# Patient Record
Sex: Male | Born: 1955 | Race: Black or African American | Hispanic: No | Marital: Single | Smoking: Never smoker
Health system: Southern US, Community
[De-identification: ages and names within clinical notes are randomized; demographics above are authoritative.]

## PROBLEM LIST (undated history)

## (undated) DIAGNOSIS — I10 Essential (primary) hypertension: Secondary | ICD-10-CM

## (undated) DIAGNOSIS — T7840XA Allergy, unspecified, initial encounter: Secondary | ICD-10-CM

## (undated) DIAGNOSIS — E119 Type 2 diabetes mellitus without complications: Secondary | ICD-10-CM

## (undated) DIAGNOSIS — D509 Iron deficiency anemia, unspecified: Secondary | ICD-10-CM

## (undated) DIAGNOSIS — D72819 Decreased white blood cell count, unspecified: Secondary | ICD-10-CM

## (undated) DIAGNOSIS — R030 Elevated blood-pressure reading, without diagnosis of hypertension: Secondary | ICD-10-CM

## (undated) DIAGNOSIS — K219 Gastro-esophageal reflux disease without esophagitis: Secondary | ICD-10-CM

## (undated) DIAGNOSIS — N400 Enlarged prostate without lower urinary tract symptoms: Secondary | ICD-10-CM

## (undated) HISTORY — DX: Essential (primary) hypertension: I10

## (undated) HISTORY — DX: Allergy, unspecified, initial encounter: T78.40XA

## (undated) HISTORY — DX: Elevated blood-pressure reading, without diagnosis of hypertension: R03.0

## (undated) HISTORY — DX: Type 2 diabetes mellitus without complications: E11.9

## (undated) HISTORY — DX: Iron deficiency anemia, unspecified: D50.9

## (undated) HISTORY — DX: Benign prostatic hyperplasia without lower urinary tract symptoms: N40.0

## (undated) HISTORY — DX: Gastro-esophageal reflux disease without esophagitis: K21.9

## (undated) HISTORY — DX: Decreased white blood cell count, unspecified: D72.819

---

## 1997-08-21 ENCOUNTER — Encounter: Admission: RE | Admit: 1997-08-21 | Discharge: 1997-08-21 | Payer: Self-pay | Admitting: Family Medicine

## 1997-09-07 ENCOUNTER — Encounter: Admission: RE | Admit: 1997-09-07 | Discharge: 1997-12-06 | Payer: Self-pay | Admitting: *Deleted

## 2000-05-29 ENCOUNTER — Encounter: Admission: RE | Admit: 2000-05-29 | Discharge: 2000-07-02 | Payer: Self-pay | Admitting: Family Medicine

## 2003-06-19 ENCOUNTER — Encounter: Payer: Self-pay | Admitting: Family Medicine

## 2005-01-10 ENCOUNTER — Ambulatory Visit: Payer: Self-pay | Admitting: Family Medicine

## 2005-01-24 ENCOUNTER — Ambulatory Visit: Payer: Self-pay

## 2006-04-15 ENCOUNTER — Ambulatory Visit: Payer: Self-pay | Admitting: Family Medicine

## 2006-04-15 LAB — CONVERTED CEMR LAB
ALT: 20 units/L (ref 0–40)
AST: 20 units/L (ref 0–37)
Albumin: 3.1 g/dL — ABNORMAL LOW (ref 3.5–5.2)
Alkaline Phosphatase: 67 units/L (ref 39–117)
BUN: 11 mg/dL (ref 6–23)
Basophils Absolute: 0 10*3/uL (ref 0.0–0.1)
Basophils Relative: 1.4 % — ABNORMAL HIGH (ref 0.0–1.0)
Bilirubin, Direct: 0.1 mg/dL (ref 0.0–0.3)
CO2: 29 meq/L (ref 19–32)
Calcium: 8.4 mg/dL (ref 8.4–10.5)
Chloride: 109 meq/L (ref 96–112)
Cholesterol: 158 mg/dL (ref 0–200)
Creatinine, Ser: 1.3 mg/dL (ref 0.4–1.5)
Eosinophils Absolute: 0.1 10*3/uL (ref 0.0–0.6)
Eosinophils Relative: 3 % (ref 0.0–5.0)
GFR calc Af Amer: 74 mL/min
GFR calc non Af Amer: 61 mL/min
Glucose, Bld: 84 mg/dL (ref 70–99)
HCT: 44.1 % (ref 39.0–52.0)
HDL: 34.9 mg/dL — ABNORMAL LOW (ref >39.0)
Hemoglobin: 14.7 g/dL (ref 13.0–17.0)
LDL Cholesterol: 101 mg/dL — ABNORMAL HIGH (ref 0–99)
Lymphocytes Relative: 35.4 % (ref 12.0–46.0)
MCHC: 33.3 g/dL (ref 30.0–36.0)
MCV: 82.7 fL (ref 78.0–100.0)
Monocytes Absolute: 0.6 10*3/uL (ref 0.2–0.7)
Monocytes Relative: 18.2 % — ABNORMAL HIGH (ref 3.0–11.0)
Neutro Abs: 1.3 10*3/uL — ABNORMAL LOW (ref 1.4–7.7)
Neutrophils Relative %: 42 % — ABNORMAL LOW (ref 43.0–77.0)
PSA: 0.95 ng/mL (ref 0.10–4.00)
Platelets: 285 10*3/uL (ref 150–400)
Potassium: 3.8 meq/L (ref 3.5–5.1)
RBC: 5.33 M/uL (ref 4.22–5.81)
RDW: 15.3 % — ABNORMAL HIGH (ref 11.5–14.6)
Sodium: 143 meq/L (ref 135–145)
TSH: 1.78 microintl units/mL (ref 0.35–5.50)
Total Bilirubin: 0.4 mg/dL (ref 0.3–1.2)
Total CHOL/HDL Ratio: 4.5
Total Protein: 5.9 g/dL — ABNORMAL LOW (ref 6.0–8.3)
Triglycerides: 109 mg/dL (ref 0–149)
VLDL: 22 mg/dL (ref 0–40)
WBC: 3.1 10*3/uL — ABNORMAL LOW (ref 4.5–10.5)

## 2006-04-22 ENCOUNTER — Ambulatory Visit: Payer: Self-pay | Admitting: Family Medicine

## 2007-01-04 DIAGNOSIS — K219 Gastro-esophageal reflux disease without esophagitis: Secondary | ICD-10-CM | POA: Insufficient documentation

## 2007-01-04 HISTORY — DX: Gastro-esophageal reflux disease without esophagitis: K21.9

## 2007-04-19 ENCOUNTER — Ambulatory Visit: Payer: Self-pay | Admitting: Family Medicine

## 2007-04-19 LAB — CONVERTED CEMR LAB
Bilirubin Urine: NEGATIVE
Blood in Urine, dipstick: NEGATIVE
Glucose, Urine, Semiquant: NEGATIVE
Ketones, urine, test strip: NEGATIVE
Nitrite: NEGATIVE
Specific Gravity, Urine: 1.025
Urobilinogen, UA: 0.2
WBC Urine, dipstick: NEGATIVE
pH: 6

## 2007-04-21 LAB — CONVERTED CEMR LAB
ALT: 20 units/L (ref 0–53)
AST: 17 units/L (ref 0–37)
Albumin: 3.1 g/dL — ABNORMAL LOW (ref 3.5–5.2)
Alkaline Phosphatase: 60 units/L (ref 39–117)
BUN: 11 mg/dL (ref 6–23)
Basophils Absolute: 0 10*3/uL (ref 0.0–0.1)
Basophils Relative: 0.9 % (ref 0.0–1.0)
Bilirubin, Direct: 0.2 mg/dL (ref 0.0–0.3)
CO2: 28 meq/L (ref 19–32)
Calcium: 8.6 mg/dL (ref 8.4–10.5)
Chloride: 106 meq/L (ref 96–112)
Cholesterol: 143 mg/dL (ref 0–200)
Creatinine, Ser: 1.3 mg/dL (ref 0.4–1.5)
Eosinophils Absolute: 0.1 10*3/uL (ref 0.0–0.6)
Eosinophils Relative: 2 % (ref 0.0–5.0)
GFR calc Af Amer: 74 mL/min
GFR calc non Af Amer: 61 mL/min
Glucose, Bld: 114 mg/dL — ABNORMAL HIGH (ref 70–99)
HCT: 38.1 % — ABNORMAL LOW (ref 39.0–52.0)
HDL: 22.4 mg/dL — ABNORMAL LOW (ref >39.0)
Hemoglobin: 12.3 g/dL — ABNORMAL LOW (ref 13.0–17.0)
LDL Cholesterol: 104 mg/dL — ABNORMAL HIGH (ref 0–99)
Lymphocytes Relative: 35.4 % (ref 12.0–46.0)
MCHC: 32.2 g/dL (ref 30.0–36.0)
MCV: 73.8 fL — ABNORMAL LOW (ref 78.0–100.0)
Monocytes Absolute: 0.6 10*3/uL (ref 0.2–0.7)
Monocytes Relative: 20.4 % — ABNORMAL HIGH (ref 3.0–11.0)
Neutro Abs: 1.1 10*3/uL — ABNORMAL LOW (ref 1.4–7.7)
Neutrophils Relative %: 41.3 % — ABNORMAL LOW (ref 43.0–77.0)
PSA: 0.73 ng/mL (ref 0.10–4.00)
Platelets: 277 10*3/uL (ref 150–400)
Potassium: 3.9 meq/L (ref 3.5–5.1)
RBC: 5.16 M/uL (ref 4.22–5.81)
RDW: 16.8 % — ABNORMAL HIGH (ref 11.5–14.6)
Sodium: 140 meq/L (ref 135–145)
TSH: 2.15 microintl units/mL (ref 0.35–5.50)
Total Bilirubin: 0.4 mg/dL (ref 0.3–1.2)
Total CHOL/HDL Ratio: 6.4
Total Protein: 5.5 g/dL — ABNORMAL LOW (ref 6.0–8.3)
Triglycerides: 85 mg/dL (ref 0–149)
VLDL: 17 mg/dL (ref 0–40)
WBC: 2.8 10*3/uL — ABNORMAL LOW (ref 4.5–10.5)

## 2007-04-26 ENCOUNTER — Ambulatory Visit: Payer: Self-pay | Admitting: Family Medicine

## 2007-04-26 DIAGNOSIS — R7309 Other abnormal glucose: Secondary | ICD-10-CM

## 2007-04-26 DIAGNOSIS — I10 Essential (primary) hypertension: Secondary | ICD-10-CM | POA: Insufficient documentation

## 2007-04-26 DIAGNOSIS — D509 Iron deficiency anemia, unspecified: Secondary | ICD-10-CM

## 2007-04-26 DIAGNOSIS — R030 Elevated blood-pressure reading, without diagnosis of hypertension: Secondary | ICD-10-CM

## 2007-04-26 HISTORY — DX: Elevated blood-pressure reading, without diagnosis of hypertension: R03.0

## 2007-04-26 HISTORY — DX: Iron deficiency anemia, unspecified: D50.9

## 2007-05-27 ENCOUNTER — Telehealth: Payer: Self-pay | Admitting: Family Medicine

## 2007-06-23 ENCOUNTER — Telehealth: Payer: Self-pay | Admitting: Family Medicine

## 2007-06-25 ENCOUNTER — Ambulatory Visit: Payer: Self-pay | Admitting: Family Medicine

## 2007-06-25 DIAGNOSIS — M542 Cervicalgia: Secondary | ICD-10-CM

## 2007-07-20 ENCOUNTER — Ambulatory Visit: Payer: Self-pay | Admitting: Family Medicine

## 2007-07-20 DIAGNOSIS — J209 Acute bronchitis, unspecified: Secondary | ICD-10-CM

## 2007-08-26 ENCOUNTER — Telehealth: Payer: Self-pay | Admitting: Family Medicine

## 2007-09-02 ENCOUNTER — Encounter: Admission: RE | Admit: 2007-09-02 | Discharge: 2007-09-29 | Payer: Self-pay | Admitting: Family Medicine

## 2007-09-30 ENCOUNTER — Encounter: Payer: Self-pay | Admitting: Family Medicine

## 2007-10-05 ENCOUNTER — Ambulatory Visit: Payer: Self-pay | Admitting: Family Medicine

## 2007-11-29 ENCOUNTER — Ambulatory Visit: Payer: Self-pay | Admitting: Family Medicine

## 2007-11-29 DIAGNOSIS — L909 Atrophic disorder of skin, unspecified: Secondary | ICD-10-CM | POA: Insufficient documentation

## 2007-11-29 DIAGNOSIS — L919 Hypertrophic disorder of the skin, unspecified: Secondary | ICD-10-CM

## 2008-02-22 ENCOUNTER — Telehealth: Payer: Self-pay | Admitting: Family Medicine

## 2008-06-23 ENCOUNTER — Encounter (INDEPENDENT_AMBULATORY_CARE_PROVIDER_SITE_OTHER): Payer: Self-pay | Admitting: *Deleted

## 2008-07-31 ENCOUNTER — Ambulatory Visit: Payer: Self-pay | Admitting: Gastroenterology

## 2008-08-09 ENCOUNTER — Encounter: Payer: Self-pay | Admitting: Family Medicine

## 2008-08-10 ENCOUNTER — Encounter: Payer: Self-pay | Admitting: Gastroenterology

## 2008-08-10 ENCOUNTER — Ambulatory Visit: Payer: Self-pay | Admitting: Gastroenterology

## 2008-08-13 ENCOUNTER — Encounter: Payer: Self-pay | Admitting: Gastroenterology

## 2008-11-21 ENCOUNTER — Telehealth: Payer: Self-pay | Admitting: Family Medicine

## 2008-12-26 ENCOUNTER — Ambulatory Visit: Payer: Self-pay | Admitting: Family Medicine

## 2008-12-26 ENCOUNTER — Telehealth: Payer: Self-pay | Admitting: Family Medicine

## 2008-12-26 DIAGNOSIS — K5289 Other specified noninfective gastroenteritis and colitis: Secondary | ICD-10-CM

## 2009-01-16 ENCOUNTER — Ambulatory Visit: Payer: Self-pay | Admitting: Family Medicine

## 2009-01-16 LAB — CONVERTED CEMR LAB
Bilirubin Urine: NEGATIVE
Blood in Urine, dipstick: NEGATIVE
Glucose, Urine, Semiquant: NEGATIVE
Ketones, urine, test strip: NEGATIVE
MCHC: 32.7 g/dL (ref 30.0–36.0)
MCV: 71.6 fL — ABNORMAL LOW (ref 78.0–100.0)
Nitrite: NEGATIVE
Protein, U semiquant: NEGATIVE
RDW: 18.7 % — ABNORMAL HIGH (ref 11.5–14.6)
Specific Gravity, Urine: 1.01
Urobilinogen, UA: 0.2
WBC Urine, dipstick: NEGATIVE
pH: 5.5

## 2009-01-17 LAB — CONVERTED CEMR LAB
Alkaline Phosphatase: 70 units/L (ref 39–117)
Bilirubin, Direct: 0 mg/dL (ref 0.0–0.3)
Calcium: 8.9 mg/dL (ref 8.4–10.5)
Chloride: 108 meq/L (ref 96–112)
Creatinine, Ser: 1.2 mg/dL (ref 0.4–1.5)
HDL: 32.2 mg/dL — ABNORMAL LOW (ref >39.00)
LDL Cholesterol: 99 mg/dL (ref 0–99)
PSA: 0.95 ng/mL (ref 0.10–4.00)
Sodium: 144 meq/L (ref 135–145)
Total Bilirubin: 0.6 mg/dL (ref 0.3–1.2)
Total CHOL/HDL Ratio: 5
Triglycerides: 100 mg/dL (ref 0.0–149.0)
WBC: 2.4 10*3/uL — ABNORMAL LOW (ref 4.5–10.5)

## 2009-01-24 ENCOUNTER — Ambulatory Visit: Payer: Self-pay | Admitting: Family Medicine

## 2009-01-24 DIAGNOSIS — D72819 Decreased white blood cell count, unspecified: Secondary | ICD-10-CM | POA: Insufficient documentation

## 2009-01-24 HISTORY — DX: Decreased white blood cell count, unspecified: D72.819

## 2009-01-25 ENCOUNTER — Ambulatory Visit: Payer: Self-pay | Admitting: Oncology

## 2009-03-15 ENCOUNTER — Ambulatory Visit: Payer: Self-pay | Admitting: Oncology

## 2009-03-19 ENCOUNTER — Encounter: Payer: Self-pay | Admitting: Family Medicine

## 2009-03-19 LAB — MORPHOLOGY: PLT EST: ADEQUATE

## 2009-03-19 LAB — CBC WITH DIFFERENTIAL/PLATELET
Eosinophils Absolute: 0.1 10*3/uL (ref 0.0–0.5)
MCV: 75.4 fL — ABNORMAL LOW (ref 79.3–98.0)
MONO#: 0.5 10*3/uL (ref 0.1–0.9)
MONO%: 15.4 % — ABNORMAL HIGH (ref 0.0–14.0)
NEUT#: 1.8 10*3/uL (ref 1.5–6.5)
RBC: 5.99 10*6/uL — ABNORMAL HIGH (ref 4.20–5.82)
RDW: 23.7 % — ABNORMAL HIGH (ref 11.0–14.6)
WBC: 3.4 10*3/uL — ABNORMAL LOW (ref 4.0–10.3)
lymph#: 0.9 10*3/uL (ref 0.9–3.3)

## 2009-03-22 LAB — FERRITIN: Ferritin: 21 ng/mL — ABNORMAL LOW (ref 22–322)

## 2009-03-22 LAB — TRANSFERRIN RECEPTOR, SOLUABLE: Transferrin Receptor, Soluble: 32.6 nmol/L

## 2009-03-22 LAB — COMPREHENSIVE METABOLIC PANEL
Albumin: 3.9 g/dL (ref 3.5–5.2)
Alkaline Phosphatase: 67 U/L (ref 39–117)
BUN: 10 mg/dL (ref 6–23)
Calcium: 8.7 mg/dL (ref 8.4–10.5)
Glucose, Bld: 92 mg/dL (ref 70–99)
Potassium: 3.9 mEq/L (ref 3.5–5.3)

## 2009-03-22 LAB — IRON AND TIBC
%SAT: 15 % — ABNORMAL LOW (ref 20–55)
TIBC: 368 ug/dL (ref 215–435)

## 2009-03-22 LAB — ANA: Anti Nuclear Antibody(ANA): NEGATIVE

## 2009-06-19 ENCOUNTER — Ambulatory Visit: Payer: Self-pay | Admitting: Oncology

## 2009-08-28 ENCOUNTER — Encounter: Payer: Self-pay | Admitting: Family Medicine

## 2009-08-31 ENCOUNTER — Encounter: Payer: Self-pay | Admitting: Family Medicine

## 2009-09-06 ENCOUNTER — Telehealth: Payer: Self-pay | Admitting: Family Medicine

## 2010-03-25 ENCOUNTER — Ambulatory Visit
Admission: RE | Admit: 2010-03-25 | Discharge: 2010-03-25 | Payer: Self-pay | Source: Home / Self Care | Attending: Family Medicine | Admitting: Family Medicine

## 2010-03-25 ENCOUNTER — Other Ambulatory Visit: Payer: Self-pay | Admitting: Family Medicine

## 2010-03-25 LAB — CBC WITH DIFFERENTIAL/PLATELET
Basophils Absolute: 0 10*3/uL (ref 0.0–0.1)
Basophils Relative: 0.8 % (ref 0.0–3.0)
Eosinophils Absolute: 0.1 10*3/uL (ref 0.0–0.7)
Eosinophils Relative: 3 % (ref 0.0–5.0)
HCT: 45 % (ref 39.0–52.0)
Hemoglobin: 14.5 g/dL (ref 13.0–17.0)
Lymphocytes Relative: 26.7 % (ref 12.0–46.0)
Lymphs Abs: 0.9 10*3/uL (ref 0.7–4.0)
MCHC: 32.3 g/dL (ref 30.0–36.0)
MCV: 82.4 fl (ref 78.0–100.0)
Monocytes Absolute: 0.6 10*3/uL (ref 0.1–1.0)
Monocytes Relative: 16.7 % — ABNORMAL HIGH (ref 3.0–12.0)
Neutro Abs: 1.8 10*3/uL (ref 1.4–7.7)
Neutrophils Relative %: 52.8 % (ref 43.0–77.0)
Platelets: 231 10*3/uL (ref 150.0–400.0)
RBC: 5.46 Mil/uL (ref 4.22–5.81)
RDW: 16.7 % — ABNORMAL HIGH (ref 11.5–14.6)
WBC: 3.4 10*3/uL — ABNORMAL LOW (ref 4.5–10.5)

## 2010-03-25 LAB — CONVERTED CEMR LAB
Ketones, urine, test strip: NEGATIVE
Nitrite: NEGATIVE
Urobilinogen, UA: 0.2
WBC Urine, dipstick: NEGATIVE

## 2010-03-25 LAB — HEPATIC FUNCTION PANEL
ALT: 23 U/L (ref 0–53)
AST: 18 U/L (ref 0–37)
Albumin: 3.3 g/dL — ABNORMAL LOW (ref 3.5–5.2)
Alkaline Phosphatase: 73 U/L (ref 39–117)
Bilirubin, Direct: 0.1 mg/dL (ref 0.0–0.3)
Total Bilirubin: 0.6 mg/dL (ref 0.3–1.2)
Total Protein: 6 g/dL (ref 6.0–8.3)

## 2010-03-25 LAB — BASIC METABOLIC PANEL
BUN: 13 mg/dL (ref 6–23)
CO2: 27 mEq/L (ref 19–32)
Calcium: 8.7 mg/dL (ref 8.4–10.5)
Chloride: 104 mEq/L (ref 96–112)
Creatinine, Ser: 1.4 mg/dL (ref 0.4–1.5)
GFR: 69.46 mL/min (ref >60.00)
Glucose, Bld: 133 mg/dL — ABNORMAL HIGH (ref 70–99)
Potassium: 4.4 mEq/L (ref 3.5–5.1)
Sodium: 138 mEq/L (ref 135–145)

## 2010-03-25 LAB — TSH: TSH: 1.77 u[IU]/mL (ref 0.35–5.50)

## 2010-03-25 LAB — LIPID PANEL
Cholesterol: 163 mg/dL (ref 0–200)
HDL: 36.3 mg/dL — ABNORMAL LOW (ref >39.00)
LDL Cholesterol: 105 mg/dL — ABNORMAL HIGH (ref 0–99)
Total CHOL/HDL Ratio: 4
Triglycerides: 109 mg/dL (ref 0.0–149.0)
VLDL: 21.8 mg/dL (ref 0.0–40.0)

## 2010-03-25 LAB — PSA: PSA: 3.23 ng/mL (ref 0.10–4.00)

## 2010-03-29 ENCOUNTER — Encounter: Payer: Self-pay | Admitting: Family Medicine

## 2010-04-01 ENCOUNTER — Encounter: Payer: Self-pay | Admitting: Family Medicine

## 2010-04-01 ENCOUNTER — Ambulatory Visit
Admission: RE | Admit: 2010-04-01 | Discharge: 2010-04-01 | Payer: Self-pay | Source: Home / Self Care | Attending: Family Medicine | Admitting: Family Medicine

## 2010-04-01 NOTE — Progress Notes (Unsigned)
Subjective:     Patient ID: Bryan Smith is a 55 y.o. male.  HPI 55 yr old male for a cpx. He feels well with no complaints. His recent labs showed a glucose of 133, so he now has type 2 DM. We discussed this today, and he admits to not eating a healthy diet and not exercising. He drinks a lot of sweet tea.   Review of Systems  Constitutional: Negative.   HENT: Negative.   Eyes: Negative.   Respiratory: Negative.   Cardiovascular: Negative.   Gastrointestinal: Negative.   Genitourinary: Negative.   Musculoskeletal: Negative.   Skin: Negative.   Neurological: Negative.   Hematological: Negative.   Psychiatric/Behavioral: Negative.        Objective:   Physical Exam  Constitutional: He is oriented to person, place, and time. He appears well-developed and well-nourished.  HENT:  Head: Normocephalic and atraumatic.  Left Ear: External ear normal.  Nose: Nose normal.  Mouth/Throat: Oropharynx is clear and moist.  Eyes: Conjunctivae and EOM are normal. Pupils are equal, round, and reactive to light.  Neck: Normal range of motion. Neck supple.  Cardiovascular: Normal rate, regular rhythm, normal heart sounds and intact distal pulses.   Pulmonary/Chest: Breath sounds normal.  Abdominal: Soft. He exhibits no mass. There is no tenderness. There is no rebound and no guarding.  Genitourinary: Rectum normal, prostate normal and penis normal. Guaiac negative stool.  Musculoskeletal: Normal range of motion.  Lymphadenopathy:    He has no cervical adenopathy.  Neurological: He is oriented to person, place, and time. He has normal reflexes.  Skin: Skin is warm and dry.  Psychiatric: He has a normal mood and affect. His behavior is normal. Judgment and thought content normal.       Assessment:     well exam. He is overweight and has new onset type 2 DM.     Plan:     Exercise. Lose weight, reduce his carbohydrate intake. Stop ice tea and drink water. Recheck with an A1c in 3  months

## 2010-04-09 NOTE — Progress Notes (Signed)
  Phone Note Call from Patient   Caller: Patient Call For: Nelwyn Salisbury MD Summary of Call: Pt called to ask for a name of OTC med for constipation.......Marland KitchenSuggested Miralax. Initial call taken by: Lynann Beaver CMA,  September 06, 2009 11:14 AM  Follow-up for Phone Call        agreed Follow-up by: Nelwyn Salisbury MD,  September 06, 2009 11:59 AM

## 2010-04-09 NOTE — Medication Information (Signed)
Summary: Nexium Approved  Nexium Approved   Imported By: Maryln Gottron 09/05/2009 15:56:48  _____________________________________________________________________  External Attachment:    Type:   Image     Comment:   External Document

## 2010-04-09 NOTE — Letter (Signed)
[facility]   Imported By: Maryln Gottron 04/05/2009 11:13:15  _____________________________________________________________________  External Attachment:    Type:   Image     Comment:   External Document

## 2010-04-09 NOTE — Medication Information (Signed)
Summary: Prior Authorization Request for Nexium  Prior Authorization Request for Nexium   Imported By: Maryln Gottron 09/06/2009 10:49:24  _____________________________________________________________________  External Attachment:    Type:   Image     Comment:   External Document

## 2010-04-11 NOTE — Assessment & Plan Note (Signed)
Summary: CPX/NJR   Allergies: No Known Drug Allergies   Impression & Recommendations:  Problem # 1:  WELL ADULT EXAM (ICD-V70.0)  Orders: EKG w/ Interpretation (93000) Hemoccult Guaiac-1 spec.(in office) (82270)  Complete Medication List: 1)  Nexium 40 Mg Cpdr (Esomeprazole magnesium) .Marland Kitchen.. 1 by mouth once daily  Patient Instructions: 1)  Please schedule a follow-up appointment in 3 months .  Prescriptions: NEXIUM 40 MG CPDR (ESOMEPRAZOLE MAGNESIUM) 1 by mouth once daily  #30 x 11   Entered and Authorized by:   Nelwyn Salisbury MD   Signed by:   Nelwyn Salisbury MD on 04/01/2010   Method used:   [facility] to        [facility] [phone]* (retail)       [address]       [place], [place]  [address]       Ph: [phone] or [phone]       Fax: [phone]   RxID:   [id number]   04/01/2010 11:12 AM         Subjective: Patient ID: Bryan Smith is a 55 y.o. male.  HPI  55 yr old male for a cpx. He feels well with no complaints. His recent labs showed a glucose of 133, so he now has type 2 DM. We discussed this today, and he admits to not eating a healthy diet and not exercising. He drinks a lot of sweet tea. Review of Systems Constitutional: Negative. HENT: Negative. Eyes: Negative. Respiratory: Negative. Cardiovascular: Negative. Gastrointestinal: Negative. Genitourinary: Negative. Musculoskeletal: Negative. Skin: Negative. Neurological: Negative. Hematological: Negative. Psychiatric/Behavioral: Negative.   Objective: Physical Exam Constitutional: He is oriented to person, place, and time. He appears well-developed and well-nourished. HENT: Head: Normocephalic and atraumatic. Left Ear: External ear normal. Nose: Nose normal. Mouth/Throat: Oropharynx is clear and moist. Eyes: Conjunctivae and EOM are normal. Pupils are equal, round, and reactive to light. Neck: Normal range of motion. Neck supple. Cardiovascular: Normal rate, regular rhythm,  normal heart sounds and intact distal pulses. Pulmonary/Chest: Breath sounds normal. Abdominal: Soft. He exhibits no mass. There is no tenderness. There is no rebound and no guarding. Genitourinary: Rectum normal, prostate normal and penis normal. Guaiac negative stool. Musculoskeletal: Normal range of motion. Lymphadenopathy: He has no cervical adenopathy. Neurological: He is oriented to person, place, and time. He has normal reflexes. Skin: Skin is warm and dry. Psychiatric: He has a normal mood and affect. His behavior is normal. Judgment and thought content normal.   Assessment:  well exam. He is overweight and has new onset type 2 DM.  Plan:  Exercise. Lose weight, reduce his carbohydrate intake. Stop ice tea and drink water. Recheck with an A1c in 3 months    Orders Added: 1)  Est. Patient 40-64 years [99396] 2)  EKG w/ Interpretation [93000] 3)  Hemoccult Guaiac-1 spec.(in office) [82270]    04/01/2010 11:12 AM         Subjective: Patient ID: Bryan Smith is a 55 y.o. male.  HPI  55 yr old male for a cpx. He feels well with no complaints. His recent labs showed a glucose of 133, so he now has type 2 DM. We discussed this today, and he admits to not eating a healthy diet and not exercising. He drinks a lot of sweet tea. Review of Systems Constitutional: Negative. HENT: Negative. Eyes: Negative. Respiratory: Negative. Cardiovascular: Negative. Gastrointestinal: Negative. Genitourinary: Negative. Musculoskeletal: Negative. Skin: Negative. Neurological: Negative. Hematological: Negative. Psychiatric/Behavioral: Negative.   Objective:  Physical Exam Constitutional: He is oriented to person, place, and time. He appears well-developed and well-nourished. HENT: Head: Normocephalic and atraumatic. Left Ear: External ear normal. Nose: Nose normal. Mouth/Throat: Oropharynx is clear and moist. Eyes: Conjunctivae and EOM are normal. Pupils are equal, round, and  reactive to light. Neck: Normal range of motion. Neck supple. Cardiovascular: Normal rate, regular rhythm, normal heart sounds and intact distal pulses. Pulmonary/Chest: Breath sounds normal. Abdominal: Soft. He exhibits no mass. There is no tenderness. There is no rebound and no guarding. Genitourinary: Rectum normal, prostate normal and penis normal. Guaiac negative stool. Musculoskeletal: Normal range of motion. Lymphadenopathy: He has no cervical adenopathy. Neurological: He is oriented to person, place, and time. He has normal reflexes. Skin: Skin is warm and dry. Psychiatric: He has a normal mood and affect. His behavior is normal. Judgment and thought content normal.   Assessment:  well exam. He is overweight and has new onset type 2 DM.  Plan:  Exercise. Lose weight, reduce his carbohydrate intake. Stop ice tea and drink water. Recheck with an A1c in 3 months

## 2010-04-15 ENCOUNTER — Other Ambulatory Visit: Payer: Self-pay | Admitting: Family Medicine

## 2010-04-15 DIAGNOSIS — R112 Nausea with vomiting, unspecified: Secondary | ICD-10-CM

## 2010-07-01 ENCOUNTER — Ambulatory Visit: Payer: Self-pay | Admitting: Family Medicine

## 2010-07-08 ENCOUNTER — Encounter: Payer: Self-pay | Admitting: Family Medicine

## 2010-07-08 ENCOUNTER — Ambulatory Visit (INDEPENDENT_AMBULATORY_CARE_PROVIDER_SITE_OTHER): Payer: BC Managed Care – PPO | Admitting: Family Medicine

## 2010-07-08 VITALS — BP 122/82 | HR 94 | Temp 97.9°F | Wt 218.0 lb

## 2010-07-08 DIAGNOSIS — E119 Type 2 diabetes mellitus without complications: Secondary | ICD-10-CM

## 2010-07-08 LAB — MICROALBUMIN / CREATININE URINE RATIO
Creatinine,U: 198.8 mg/dL
Microalb Creat Ratio: 0.4 mg/g (ref 0.0–30.0)

## 2010-07-08 NOTE — Progress Notes (Signed)
  Subjective:    Patient ID: Bryan Smith, male    DOB: [birth year], 55 y.o.   MRN: [id number]  HPI Here to follow up afer being being diagnosed with type 2 diabetes three months ago during a cpx. He has made some dietary changes and he feels great. He is walking 3-4 days a week.    Review of Systems  Constitutional: Negative.   Respiratory: Negative.   Cardiovascular: Negative.   Gastrointestinal: Negative.        Objective:   Physical Exam  Constitutional: He appears well-developed and well-nourished.  Neck: No thyromegaly present.  Cardiovascular: Normal rate, regular rhythm, normal heart sounds and intact distal pulses.   Pulmonary/Chest: Effort normal and breath sounds normal.  Lymphadenopathy:    He has no cervical adenopathy.          Assessment & Plan:  Continue diet and exercise. Check an A1c

## 2010-07-10 ENCOUNTER — Telehealth: Payer: Self-pay | Admitting: Family Medicine

## 2010-07-10 NOTE — Telephone Encounter (Signed)
Message copied by Burnard Leigh on Wed Jul 10, 2010 11:20 AM ------      Message from: Dwaine Deter      Created: Wed Jul 10, 2010  9:11 AM       Diabetes is well controlled

## 2010-07-10 NOTE — Telephone Encounter (Signed)
LMOM to inform Pt. 

## 2010-07-26 NOTE — Letter (Signed)
April 22, 2006     RE:  [name], [name]  MRN:  [id number]  /  DOB:  1955/09/16   To Whom It May Concern:   This letter is concerning a patient of mine by the name of Bryan Smith (date of birth [birth year]). I have been this patient's  primary care physician since I met him on March 28 of 2005. One ongoing,  daily and disabling problem for him has been excessive sweating around  the scalp, the face and the neck. He finds this extremely embarrassing  and has interfered with his social life, his work life, etc. He has  attempted numerous forms of treatment over the years, none of which have  been successful. These have included oral medications such as beta-  blockers, he has tried acupuncture, he has tried topical medications  such as Drysol, none of which helped him whatsoever. I suggested to him  several years ago that Botox injections have shown in clinical trials to  be extremely safe and extremely effective for problems like excessive  sweating. He was very interested in having this done several years ago,  but unfortunately your company refused to pay for this treatment. This  letter will serve as an appeal from both the patient and myself to  reconsider this decision. This is a serious problem, and I think he has  an excellent chance of having it treated quite adequately with Botox  injections. This is a well-established and indicated usage of this  product, and I feel that he should be covered for such treatments.   If I may be of further assistance, please let me know.    Sincerely,     Tera Mater. Clent Ridges, MD  Electronically Signed   SAF/MedQ  DD: 04/22/2006  DT: 04/22/2006  Job #: [id number]

## 2010-07-26 NOTE — Assessment & Plan Note (Signed)
[facility] OFFICE NOTE   NAME:Bryan Smith, Bryan Smith                    MRN:          [id number]  DATE:04/22/2006                            DOB:          1955/09/06    This is a 55 year old gentleman here for a complete physical  examination. In general, he is doing well. However, he does have one  complaint, which we have been working with over the past several years  that is of daily excessive sweating over the neck, face and scalp area.  This has been treated in the past with oral medications such as beta-  blocker to no avail. He has tried topical creams such as Drysol to no  avail. He has had acupuncture to no avail. He continues to have the  problem. Several years ago, I mentioned to him that Botox injections  have the possibility of giving him some relief. He was unable to pursue  it at that time because his insurance company would not cover it. He  would now like to appeal this decision and ask his insurance company  again if they would cover this treatment.   PAST MEDICAL HISTORY:  1. We have been following him for iron-deficiency anemia, which has      been fairly stable. He had a normal cardiac stress test on January 23, 2005. He had normal colonoscopy in June of 2005.  2. We have been treating him for stable acid reflux disease.   Further details of his past medical history, family history, social  history, habits, etc..refer to our last physical note dated January 10, 2005.   ALLERGIES:  None.   CURRENT MEDICATIONS:  Nexium 40 mg once a day as needed.   OBJECTIVE:  Height 5 feet, 10 inches. Weight is 231. Blood pressure  122/92, pulse 88 and regular.  In general, he is overweight.  SKIN: Is clear. It is warm and dry.  EYES: Are clear.  EARS: Are clear.  PHARYNX: Is clear.  NECK: Supple, without lymphadenopathy or masses.  LUNGS:  Clear.  CARDIAC: Rate and rhythm are regular without  gallops, murmur or rubs.  Distal pulses are full. EKG is within normal limits.  ABDOMEN: Soft. Normal bowel sounds, nontender and no masses.  GENITALIA: Normal male.  RECTAL: No masses or tenderness. Stool Hemoccult negative. Prostate is  within normal limits.  EXTREMITIES: No clubbing, cyanosis or edema.  NEUROLOGIC: Grossly intact.   He was here for fasting labs on February 6th. These were all within  normal limits. He has his usual mild neutropenia with a white blood cell  count of 3.1. However, this is quite stable and when he had a hematology  work up several years ago, they felt this was not a problem that needed  to be addressed. His hemoglobin is excellent at 14.7 and platelets are  stable at 285000. Otherwise, his blood results are within normal limits.   ASSESSMENT/PLAN:  1. Complete physical. I encouraged him to get more exercise and lose      weight.  2. History of  iron-deficiency anemia, stable off of supplementation.  3. Gastroesophageal reflux disease, stable.  4. Excessive head and facial sweating. We will generate a letter and      try to appeal to his insurance company to get approval for Botox      treatments.     Tera Mater. Clent Ridges, MD  Electronically Signed    SAF/MedQ  DD: 04/22/2006  DT: 04/22/2006  Job #: [id number]

## 2011-03-10 ENCOUNTER — Telehealth: Payer: Self-pay | Admitting: *Deleted

## 2011-03-10 NOTE — Telephone Encounter (Signed)
Pt is having cough and congestion and would like something called into [facility].  Please notify pt when its done

## 2011-03-12 NOTE — Telephone Encounter (Signed)
He needs an OV  

## 2011-03-13 NOTE — Telephone Encounter (Signed)
Please call pt and offer visit.

## 2011-03-14 NOTE — Telephone Encounter (Signed)
Lft vm for pt to call and sch ov as noted.

## 2011-03-18 NOTE — Telephone Encounter (Signed)
Pt still has not returned call to sch ov.

## 2011-04-16 ENCOUNTER — Other Ambulatory Visit: Payer: Self-pay | Admitting: Family Medicine

## 2011-10-03 ENCOUNTER — Other Ambulatory Visit (INDEPENDENT_AMBULATORY_CARE_PROVIDER_SITE_OTHER): Payer: BC Managed Care – PPO

## 2011-10-03 DIAGNOSIS — Z0000 Encounter for general adult medical examination without abnormal findings: Secondary | ICD-10-CM

## 2011-10-03 LAB — BASIC METABOLIC PANEL
CO2: 28 mEq/L (ref 19–32)
Calcium: 8.8 mg/dL (ref 8.4–10.5)
Chloride: 106 mEq/L (ref 96–112)
Sodium: 141 mEq/L (ref 135–145)

## 2011-10-03 LAB — CBC WITH DIFFERENTIAL/PLATELET
Basophils Relative: 0.7 % (ref 0.0–3.0)
Eosinophils Absolute: 0.1 10*3/uL (ref 0.0–0.7)
Eosinophils Relative: 2.1 % (ref 0.0–5.0)
Hemoglobin: 14.9 g/dL (ref 13.0–17.0)
Lymphocytes Relative: 28.9 % (ref 12.0–46.0)
MCHC: 32.6 g/dL (ref 30.0–36.0)
MCV: 81.5 fl (ref 78.0–100.0)
Neutro Abs: 1.8 10*3/uL (ref 1.4–7.7)
Neutrophils Relative %: 50.8 % (ref 43.0–77.0)
RBC: 5.6 Mil/uL (ref 4.22–5.81)
WBC: 3.5 10*3/uL — ABNORMAL LOW (ref 4.5–10.5)

## 2011-10-03 LAB — HEPATIC FUNCTION PANEL
ALT: 22 U/L (ref 0–53)
Albumin: 3.4 g/dL — ABNORMAL LOW (ref 3.5–5.2)
Alkaline Phosphatase: 62 U/L (ref 39–117)
Bilirubin, Direct: 0.1 mg/dL (ref 0.0–0.3)
Total Protein: 5.9 g/dL — ABNORMAL LOW (ref 6.0–8.3)

## 2011-10-03 LAB — POCT URINALYSIS DIPSTICK
Bilirubin, UA: NEGATIVE
Glucose, UA: NEGATIVE
Leukocytes, UA: NEGATIVE
Nitrite, UA: NEGATIVE
Urobilinogen, UA: 0.2

## 2011-10-03 LAB — LIPID PANEL
LDL Cholesterol: 109 mg/dL — ABNORMAL HIGH (ref 0–99)
Total CHOL/HDL Ratio: 5
Triglycerides: 118 mg/dL (ref 0.0–149.0)

## 2011-10-03 LAB — PSA: PSA: 1.08 ng/mL (ref 0.10–4.00)

## 2011-10-06 NOTE — Progress Notes (Signed)
Quick Note:  I spoke with pt ______ 

## 2011-10-09 ENCOUNTER — Ambulatory Visit (INDEPENDENT_AMBULATORY_CARE_PROVIDER_SITE_OTHER): Payer: BC Managed Care – PPO | Admitting: Family Medicine

## 2011-10-09 ENCOUNTER — Encounter: Payer: Self-pay | Admitting: Family Medicine

## 2011-10-09 VITALS — BP 132/88 | HR 103 | Temp 98.0°F | Ht 70.0 in | Wt 222.0 lb

## 2011-10-09 DIAGNOSIS — M542 Cervicalgia: Secondary | ICD-10-CM

## 2011-10-09 DIAGNOSIS — Z0000 Encounter for general adult medical examination without abnormal findings: Secondary | ICD-10-CM

## 2011-10-09 DIAGNOSIS — E119 Type 2 diabetes mellitus without complications: Secondary | ICD-10-CM

## 2011-10-09 LAB — MICROALBUMIN / CREATININE URINE RATIO
Creatinine,U: 179.4 mg/dL
Microalb Creat Ratio: 0.5 mg/g (ref 0.0–30.0)
Microalb, Ur: 0.9 mg/dL (ref 0.0–1.9)

## 2011-10-09 LAB — HEMOGLOBIN A1C: Hgb A1c MFr Bld: 6.5 % (ref 4.6–6.5)

## 2011-10-09 NOTE — Progress Notes (Signed)
  Subjective:    Patient ID: Bryan Smith, male    DOB: 20-Oct-1955, 56 y.o.   MRN: [id number]  HPI 56 yr old male for a cpx. He feels well in general although he admits to getting very little exercise. He has a hx of a pinched nerve in the left neck, and he took PT for this 4 years ago. Lately this has flared up again, causing some numbness and tingling and mild pains from the neck down the left arm.    Review of Systems  Constitutional: Negative.   HENT: Negative.   Eyes: Negative.   Respiratory: Negative.   Cardiovascular: Negative.   Gastrointestinal: Negative.   Genitourinary: Negative.   Musculoskeletal: Negative.   Skin: Negative.   Neurological: Positive for numbness. Negative for dizziness, tremors, seizures, syncope, facial asymmetry, speech difficulty, weakness, light-headedness and headaches.  Hematological: Negative.   Psychiatric/Behavioral: Negative.        Objective:   Physical Exam  Constitutional: He is oriented to person, place, and time. He appears well-developed and well-nourished. No distress.  HENT:  Head: Normocephalic and atraumatic.  Right Ear: External ear normal.  Left Ear: External ear normal.  Nose: Nose normal.  Mouth/Throat: Oropharynx is clear and moist. No oropharyngeal exudate.  Eyes: Conjunctivae and EOM are normal. Pupils are equal, round, and reactive to light. Right eye exhibits no discharge. Left eye exhibits no discharge. No scleral icterus.  Neck: Neck supple. No JVD present. No tracheal deviation present. No thyromegaly present.  Cardiovascular: Normal rate, regular rhythm, normal heart sounds and intact distal pulses.  Exam reveals no gallop and no friction rub.   No murmur heard.      EKG normal with occasional PACs   Pulmonary/Chest: Effort normal and breath sounds normal. No respiratory distress. He has no wheezes. He has no rales. He exhibits no tenderness.  Abdominal: Soft. Bowel sounds are normal. He exhibits no distension and  no mass. There is no tenderness. There is no rebound and no guarding.  Genitourinary: Rectum normal, prostate normal and penis normal. Guaiac negative stool. No penile tenderness.  Musculoskeletal: Normal range of motion. He exhibits no edema and no tenderness.  Lymphadenopathy:    He has no cervical adenopathy.  Neurological: He is alert and oriented to person, place, and time. He has normal reflexes. No cranial nerve deficit. He exhibits normal muscle tone. Coordination normal.  Skin: Skin is warm and dry. No rash noted. He is not diaphoretic. No erythema. No pallor.  Psychiatric: He has a normal mood and affect. His behavior is normal. Judgment and thought content normal.          Assessment & Plan:  Well exam. He needs to exercise and lose some weight. Check an A1c today. Set him up for PT for the neck and arm symptoms.

## 2011-10-13 NOTE — Progress Notes (Signed)
Quick Note:  I left voice message with results. ______ 

## 2011-10-20 ENCOUNTER — Ambulatory Visit: Payer: BC Managed Care – PPO | Attending: Family Medicine | Admitting: Physical Therapy

## 2011-10-20 DIAGNOSIS — IMO0001 Reserved for inherently not codable concepts without codable children: Secondary | ICD-10-CM | POA: Insufficient documentation

## 2011-10-20 DIAGNOSIS — M25519 Pain in unspecified shoulder: Secondary | ICD-10-CM | POA: Insufficient documentation

## 2011-10-20 DIAGNOSIS — M542 Cervicalgia: Secondary | ICD-10-CM | POA: Insufficient documentation

## 2011-10-28 ENCOUNTER — Ambulatory Visit: Payer: BC Managed Care – PPO | Admitting: Physical Therapy

## 2011-11-04 ENCOUNTER — Ambulatory Visit: Payer: BC Managed Care – PPO

## 2011-11-13 ENCOUNTER — Encounter: Payer: BC Managed Care – PPO | Admitting: Physical Therapy

## 2011-11-21 ENCOUNTER — Ambulatory Visit: Payer: BC Managed Care – PPO | Attending: Family Medicine | Admitting: Physical Therapy

## 2011-11-21 DIAGNOSIS — M542 Cervicalgia: Secondary | ICD-10-CM | POA: Insufficient documentation

## 2011-11-21 DIAGNOSIS — M25519 Pain in unspecified shoulder: Secondary | ICD-10-CM | POA: Insufficient documentation

## 2011-11-21 DIAGNOSIS — IMO0001 Reserved for inherently not codable concepts without codable children: Secondary | ICD-10-CM | POA: Insufficient documentation

## 2011-11-24 ENCOUNTER — Ambulatory Visit: Payer: BC Managed Care – PPO

## 2011-11-26 ENCOUNTER — Ambulatory Visit: Payer: BC Managed Care – PPO | Admitting: Physical Therapy

## 2011-12-03 ENCOUNTER — Ambulatory Visit: Payer: BC Managed Care – PPO | Admitting: Physical Therapy

## 2011-12-09 ENCOUNTER — Ambulatory Visit: Payer: BC Managed Care – PPO | Attending: Family Medicine

## 2011-12-09 DIAGNOSIS — M2569 Stiffness of other specified joint, not elsewhere classified: Secondary | ICD-10-CM | POA: Insufficient documentation

## 2011-12-09 DIAGNOSIS — IMO0001 Reserved for inherently not codable concepts without codable children: Secondary | ICD-10-CM | POA: Insufficient documentation

## 2011-12-09 DIAGNOSIS — M542 Cervicalgia: Secondary | ICD-10-CM | POA: Insufficient documentation

## 2011-12-09 DIAGNOSIS — M25519 Pain in unspecified shoulder: Secondary | ICD-10-CM | POA: Insufficient documentation

## 2011-12-12 ENCOUNTER — Ambulatory Visit: Payer: BC Managed Care – PPO | Admitting: Physical Therapy

## 2011-12-23 ENCOUNTER — Ambulatory Visit: Payer: BC Managed Care – PPO

## 2011-12-29 ENCOUNTER — Ambulatory Visit: Payer: BC Managed Care – PPO

## 2012-01-05 ENCOUNTER — Ambulatory Visit: Payer: BC Managed Care – PPO

## 2012-01-13 ENCOUNTER — Ambulatory Visit: Payer: BC Managed Care – PPO | Attending: Family Medicine

## 2012-01-13 DIAGNOSIS — IMO0001 Reserved for inherently not codable concepts without codable children: Secondary | ICD-10-CM | POA: Insufficient documentation

## 2012-01-13 DIAGNOSIS — M542 Cervicalgia: Secondary | ICD-10-CM | POA: Insufficient documentation

## 2012-01-13 DIAGNOSIS — M25519 Pain in unspecified shoulder: Secondary | ICD-10-CM | POA: Insufficient documentation

## 2012-01-29 ENCOUNTER — Telehealth: Payer: Self-pay | Admitting: Family Medicine

## 2012-01-29 NOTE — Telephone Encounter (Signed)
Pt called and said that he has finished PT for pinched nerve and would like to get an order for MRI as recommended by the Physical Therapist. Pls call.

## 2012-01-30 NOTE — Telephone Encounter (Signed)
He needs an OV to assess this

## 2012-01-30 NOTE — Telephone Encounter (Signed)
Can you call pt and schedule the appointment? 

## 2012-02-03 NOTE — Telephone Encounter (Signed)
Called Bryan Smith and notified him that Dr Clent Ridges request that Bryan Smith come in for ov to assess. Bryan Smith said that he would call back later to sch ov as noted.

## 2012-02-28 ENCOUNTER — Other Ambulatory Visit: Payer: Self-pay | Admitting: Family Medicine

## 2012-03-22 ENCOUNTER — Other Ambulatory Visit: Payer: Self-pay | Admitting: Family Medicine

## 2012-04-23 ENCOUNTER — Other Ambulatory Visit: Payer: Self-pay | Admitting: Family Medicine

## 2012-12-21 ENCOUNTER — Other Ambulatory Visit (INDEPENDENT_AMBULATORY_CARE_PROVIDER_SITE_OTHER): Payer: BC Managed Care – PPO

## 2012-12-21 DIAGNOSIS — Z0000 Encounter for general adult medical examination without abnormal findings: Secondary | ICD-10-CM

## 2012-12-21 LAB — BASIC METABOLIC PANEL
BUN: 11 mg/dL (ref 6–23)
Calcium: 8.9 mg/dL (ref 8.4–10.5)
Chloride: 106 mEq/L (ref 96–112)
Creatinine, Ser: 1.3 mg/dL (ref 0.4–1.5)
GFR: 74.46 mL/min (ref >60.00)

## 2012-12-21 LAB — CBC WITH DIFFERENTIAL/PLATELET
Basophils Relative: 0.6 % (ref 0.0–3.0)
Eosinophils Relative: 2.7 % (ref 0.0–5.0)
Lymphocytes Relative: 34 % (ref 12.0–46.0)
Monocytes Absolute: 0.5 10*3/uL (ref 0.1–1.0)
Monocytes Relative: 16.3 % — ABNORMAL HIGH (ref 3.0–12.0)
Neutrophils Relative %: 46.4 % (ref 43.0–77.0)
Platelets: 252 10*3/uL (ref 150.0–400.0)
RBC: 5.68 Mil/uL (ref 4.22–5.81)
WBC: 3.1 10*3/uL — ABNORMAL LOW (ref 4.5–10.5)

## 2012-12-21 LAB — HEPATIC FUNCTION PANEL
ALT: 21 U/L (ref 0–53)
AST: 18 U/L (ref 0–37)
Albumin: 3.2 g/dL — ABNORMAL LOW (ref 3.5–5.2)
Alkaline Phosphatase: 60 U/L (ref 39–117)
Bilirubin, Direct: 0.1 mg/dL (ref 0.0–0.3)
Total Protein: 5.7 g/dL — ABNORMAL LOW (ref 6.0–8.3)

## 2012-12-21 LAB — POCT URINALYSIS DIPSTICK
Blood, UA: NEGATIVE
Glucose, UA: NEGATIVE
Ketones, UA: NEGATIVE
Leukocytes, UA: NEGATIVE
Protein, UA: NEGATIVE
Spec Grav, UA: 1.015
Urobilinogen, UA: 0.2

## 2012-12-21 LAB — LIPID PANEL
Cholesterol: 171 mg/dL (ref 0–200)
HDL: 35.1 mg/dL — ABNORMAL LOW (ref >39.00)
LDL Cholesterol: 116 mg/dL — ABNORMAL HIGH (ref 0–99)
Total CHOL/HDL Ratio: 5
Triglycerides: 98 mg/dL (ref 0.0–149.0)
VLDL: 19.6 mg/dL (ref 0.0–40.0)

## 2012-12-21 LAB — PSA: PSA: 1.22 ng/mL (ref 0.10–4.00)

## 2012-12-21 LAB — TSH: TSH: 2.39 u[IU]/mL (ref 0.35–5.50)

## 2012-12-27 ENCOUNTER — Encounter: Payer: Self-pay | Admitting: Family Medicine

## 2012-12-27 ENCOUNTER — Ambulatory Visit (INDEPENDENT_AMBULATORY_CARE_PROVIDER_SITE_OTHER): Payer: BC Managed Care – PPO | Admitting: Family Medicine

## 2012-12-27 VITALS — BP 142/90 | HR 88 | Temp 97.8°F | Ht 70.0 in | Wt 221.0 lb

## 2012-12-27 DIAGNOSIS — Z0000 Encounter for general adult medical examination without abnormal findings: Secondary | ICD-10-CM

## 2012-12-27 MED ORDER — ESOMEPRAZOLE MAGNESIUM 40 MG PO CPDR: 40.0000 mg | DELAYED_RELEASE_CAPSULE | Freq: Every day | ORAL | Status: DC

## 2012-12-27 NOTE — Progress Notes (Signed)
  Subjective:    Patient ID: Bryan Smith, male    DOB: 1955-03-23, 57 y.o.   MRN: [id number]  HPI 57 yr old male for a cpx. He feels well.    Review of Systems  Constitutional: Negative.   HENT: Negative.   Eyes: Negative.   Respiratory: Negative.   Cardiovascular: Negative.   Gastrointestinal: Negative.   Genitourinary: Negative.   Musculoskeletal: Negative.   Skin: Negative.   Neurological: Negative.   Psychiatric/Behavioral: Negative.        Objective:   Physical Exam  Constitutional: He is oriented to person, place, and time. He appears well-developed and well-nourished. No distress.  HENT:  Head: Normocephalic and atraumatic.  Right Ear: External ear normal.  Left Ear: External ear normal.  Nose: Nose normal.  Mouth/Throat: Oropharynx is clear and moist. No oropharyngeal exudate.  Eyes: Conjunctivae and EOM are normal. Pupils are equal, round, and reactive to light. Right eye exhibits no discharge. Left eye exhibits no discharge. No scleral icterus.  Neck: Neck supple. No JVD present. No tracheal deviation present. No thyromegaly present.  Cardiovascular: Normal rate, regular rhythm, normal heart sounds and intact distal pulses.  Exam reveals no gallop and no friction rub.   No murmur heard. EKG normal   Pulmonary/Chest: Effort normal and breath sounds normal. No respiratory distress. He has no wheezes. He has no rales. He exhibits no tenderness.  Abdominal: Soft. Bowel sounds are normal. He exhibits no distension and no mass. There is no tenderness. There is no rebound and no guarding.  Genitourinary: Rectum normal, prostate normal and penis normal. Guaiac negative stool. No penile tenderness.  Musculoskeletal: Normal range of motion. He exhibits no edema and no tenderness.  Lymphadenopathy:    He has no cervical adenopathy.  Neurological: He is alert and oriented to person, place, and time. He has normal reflexes. No cranial nerve deficit. He exhibits normal  muscle tone. Coordination normal.  Skin: Skin is warm and dry. No rash noted. He is not diaphoretic. No erythema. No pallor.  Psychiatric: He has a normal mood and affect. His behavior is normal. Judgment and thought content normal.          Assessment & Plan:  Well exam. He needs to lose some weight .

## 2012-12-27 NOTE — Progress Notes (Signed)
Quick Note:  Pt is here now for CPE, will go over then. ______

## 2013-01-13 ENCOUNTER — Other Ambulatory Visit: Payer: Self-pay

## 2013-01-26 ENCOUNTER — Other Ambulatory Visit: Payer: Self-pay | Admitting: Family Medicine

## 2013-03-14 ENCOUNTER — Ambulatory Visit (INDEPENDENT_AMBULATORY_CARE_PROVIDER_SITE_OTHER): Payer: BC Managed Care – PPO | Admitting: Family Medicine

## 2013-03-14 ENCOUNTER — Encounter: Payer: Self-pay | Admitting: Family Medicine

## 2013-03-14 VITALS — BP 140/90 | HR 94 | Temp 98.4°F | Wt 221.0 lb

## 2013-03-14 DIAGNOSIS — J209 Acute bronchitis, unspecified: Secondary | ICD-10-CM

## 2013-03-14 MED ORDER — HYDROCODONE-HOMATROPINE 5-1.5 MG/5ML PO SYRP: 5.0000 mL | ORAL_SOLUTION | ORAL | Status: DC | PRN

## 2013-03-14 MED ORDER — HYDROCOD POLST-CHLORPHEN POLST 10-8 MG/5ML PO LQCR: 5.0000 mL | Freq: Two times a day (BID) | ORAL | Status: DC

## 2013-03-14 MED ORDER — AZITHROMYCIN 250 MG PO TABS: ORAL_TABLET | ORAL | Status: DC

## 2013-03-14 NOTE — Addendum Note (Signed)
Addended by: Alysia Penna A on: 03/14/2013 01:30 PM   Modules accepted: Orders, Medications

## 2013-03-14 NOTE — Progress Notes (Signed)
Pre visit review using our clinic review tool, if applicable. No additional management support is needed unless otherwise documented below in the visit note. 

## 2013-03-14 NOTE — Progress Notes (Signed)
   Subjective:    Patient ID: Bryan Smith, male    DOB: [birth year], 58 y.o.   MRN: [id number]  HPI Here for 6 days of chest tightness and a dry cough. No fever.    Review of Systems  Constitutional: Negative.   HENT: Positive for congestion. Negative for sinus pressure.   Eyes: Negative.   Respiratory: Positive for cough and chest tightness.        Objective:   Physical Exam  Constitutional: He appears well-developed and well-nourished.  Neck: No thyromegaly present.  Pulmonary/Chest: Effort normal and breath sounds normal. No respiratory distress. He has no wheezes. He has no rales.  Lymphadenopathy:    He has no cervical adenopathy.          Assessment & Plan:  Add Mucinex

## 2013-04-05 ENCOUNTER — Telehealth: Payer: Self-pay | Admitting: Family Medicine

## 2013-04-05 NOTE — Telephone Encounter (Signed)
FYI! Pt coming to see Dr. Sarajane Jews on 04/06/13

## 2013-04-05 NOTE — Telephone Encounter (Signed)
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 Still has nagging cough. No sputum. Has "coughing fits, especially after eating". No new foods. Prefers to be seen.   Reviewed Health History In EMR: Yes  Reviewed Medications In EMR: Yes  Reviewed Allergies In EMR: Yes  Reviewed Surgeries / Procedures: Yes  Date of Onset of Symptoms: 03/14/2013  Treatments Tried: Zpack, Robitussin and tea with honey.  Treatments Tried Worked: Yes  Guideline(s) Used:  Cough  Disposition Per Guideline:   See Today in Office  Reason For Disposition Reached:   Severe coughing spells (e.g., whooping sound after coughing, vomiting after coughing)  Advice Given:  N/A  Patient Will Follow Care Advice:  YES  Appointment Scheduled: Pt prefers to be seen on 04/06/13, to see Dr. Sarajane Jews  04/06/2013 10:30:00 Appointment Scheduled Provider:  Alysia Penna [facility])

## 2013-04-06 ENCOUNTER — Encounter: Payer: Self-pay | Admitting: Family Medicine

## 2013-04-06 ENCOUNTER — Ambulatory Visit (INDEPENDENT_AMBULATORY_CARE_PROVIDER_SITE_OTHER): Payer: BC Managed Care – PPO | Admitting: Family Medicine

## 2013-04-06 VITALS — BP 142/90 | HR 99 | Temp 98.2°F | Ht 70.0 in | Wt 220.0 lb

## 2013-04-06 DIAGNOSIS — J209 Acute bronchitis, unspecified: Secondary | ICD-10-CM

## 2013-04-06 DIAGNOSIS — R112 Nausea with vomiting, unspecified: Secondary | ICD-10-CM

## 2013-04-06 MED ORDER — METHYLPREDNISOLONE 4 MG PO KIT: PACK | ORAL | Status: DC

## 2013-04-06 MED ORDER — METHYLPREDNISOLONE 4 MG PO KIT: PACK | ORAL | Status: AC

## 2013-04-06 MED ORDER — HYDROCODONE-HOMATROPINE 5-1.5 MG/5ML PO SYRP: 5.0000 mL | ORAL_SOLUTION | ORAL | Status: DC | PRN

## 2013-04-06 NOTE — Progress Notes (Signed)
   Subjective:    Patient ID: Bryan Smith, male    DOB: [birth year], 58 y.o.   MRN: [id number]  HPI Here for a cough that has been present for 3 weeks. He was here on 03-14-13 for a bronchitis and was given a Zpack. Most of his sx have resolved but the dry cough is lingering.    Review of Systems  Constitutional: Negative.   HENT: Negative.   Eyes: Negative.   Respiratory: Positive for cough.        Objective:   Physical Exam  Constitutional: He appears well-developed and well-nourished.  HENT:  Right Ear: External ear normal.  Left Ear: External ear normal.  Nose: Nose normal.  Mouth/Throat: Oropharynx is clear and moist.  Eyes: Conjunctivae are normal.  Pulmonary/Chest: Effort normal and breath sounds normal.  Lymphadenopathy:    He has no cervical adenopathy.          Assessment & Plan:  Lingering cough. Try a Medrol dose pack

## 2013-04-06 NOTE — Progress Notes (Signed)
Pre visit review using our clinic review tool, if applicable. No additional management support is needed unless otherwise documented below in the visit note. 

## 2013-04-08 MED ORDER — PROMETHAZINE HCL 25 MG PO TABS: ORAL_TABLET | ORAL | Status: DC

## 2013-04-08 NOTE — Addendum Note (Signed)
Addended by: Aggie Hacker A on: 04/08/2013 03:53 PM   Modules accepted: Orders

## 2013-05-03 ENCOUNTER — Ambulatory Visit: Payer: BC Managed Care – PPO | Admitting: Family Medicine

## 2013-05-09 ENCOUNTER — Encounter: Payer: Self-pay | Admitting: Family Medicine

## 2013-05-09 ENCOUNTER — Ambulatory Visit (INDEPENDENT_AMBULATORY_CARE_PROVIDER_SITE_OTHER): Payer: BC Managed Care – PPO | Admitting: Family Medicine

## 2013-05-09 VITALS — BP 138/96 | HR 97 | Temp 98.7°F | Ht 70.0 in | Wt 223.0 lb

## 2013-05-09 DIAGNOSIS — R05 Cough: Secondary | ICD-10-CM

## 2013-05-09 DIAGNOSIS — R059 Cough, unspecified: Secondary | ICD-10-CM

## 2013-05-09 DIAGNOSIS — K219 Gastro-esophageal reflux disease without esophagitis: Secondary | ICD-10-CM

## 2013-05-09 NOTE — Progress Notes (Signed)
   Subjective:    Patient ID: Bryan Smith, male    DOB: 04/06/1955, 58 y.o.   MRN: [id number]  HPI Here for 2 weeks of coughing spasms during or shortly after eating a meal. He has no difficulty swallowing or speaking. No heartburn sx. When eats a meal a spasm of coughing occurs that almost gags him, and this lasts for several minutes only. Otherwise no cough or URI sx. He takes Nexium 2 or 3 days a week.    Review of Systems  Constitutional: Negative.   Respiratory: Positive for cough and choking. Negative for shortness of breath and wheezing.   Cardiovascular: Negative.   Genitourinary: Negative.        Objective:   Physical Exam  Constitutional: He appears well-developed and well-nourished. No distress.  Neck: No thyromegaly present.  Cardiovascular: Normal rate, regular rhythm, normal heart sounds and intact distal pulses.   Pulmonary/Chest: Effort normal and breath sounds normal.  Lymphadenopathy:    He has no cervical adenopathy.          Assessment & Plan:  This may be from silent reflux, so he will increase the Nexium to daily. It the cough persists after another week he will let us know, and at that time we may consider getting a swallowing study.

## 2013-05-09 NOTE — Progress Notes (Signed)
Pre visit review using our clinic review tool, if applicable. No additional management support is needed unless otherwise documented below in the visit note. 

## 2013-06-16 ENCOUNTER — Encounter: Payer: Self-pay | Admitting: Gastroenterology

## 2013-07-01 ENCOUNTER — Encounter: Payer: Self-pay | Admitting: Family Medicine

## 2013-07-01 ENCOUNTER — Ambulatory Visit (INDEPENDENT_AMBULATORY_CARE_PROVIDER_SITE_OTHER): Payer: BC Managed Care – PPO | Admitting: Family Medicine

## 2013-07-01 VITALS — BP 160/98 | HR 88 | Temp 98.1°F | Ht 70.0 in | Wt 228.0 lb

## 2013-07-01 DIAGNOSIS — J209 Acute bronchitis, unspecified: Secondary | ICD-10-CM

## 2013-07-01 MED ORDER — HYDROCODONE-HOMATROPINE 5-1.5 MG/5ML PO SYRP: 5.0000 mL | ORAL_SOLUTION | ORAL | Status: DC | PRN

## 2013-07-01 MED ORDER — AZITHROMYCIN 250 MG PO TABS: ORAL_TABLET | ORAL | Status: DC

## 2013-07-01 NOTE — Progress Notes (Signed)
Pre visit review using our clinic review tool, if applicable. No additional management support is needed unless otherwise documented below in the visit note. 

## 2013-07-01 NOTE — Progress Notes (Signed)
   Subjective:    Patient ID: Bryan Smith, male    DOB: [birth year], 58 y.o.   MRN: [id number]  HPI Here for 5 days of chest tightness and coughing up green sputum. No fever. Last month we talked about coughing spasms he was having after eating a meal and we suggested he take Nexium every day. This worked well and he felt fine until 5 days ago.   Review of Systems  Constitutional: Negative.   HENT: Negative.   Eyes: Negative.   Respiratory: Positive for cough and chest tightness.        Objective:   Physical Exam  Constitutional: He appears well-developed and well-nourished.  HENT:  Right Ear: External ear normal.  Left Ear: External ear normal.  Nose: Nose normal.  Mouth/Throat: Oropharynx is clear and moist.  Eyes: Conjunctivae are normal.  Pulmonary/Chest: Effort normal and breath sounds normal.  Lymphadenopathy:    He has no cervical adenopathy.          Assessment & Plan:  Add Mucinex

## 2013-07-18 ENCOUNTER — Encounter: Payer: Self-pay | Admitting: Family Medicine

## 2013-07-18 ENCOUNTER — Ambulatory Visit (INDEPENDENT_AMBULATORY_CARE_PROVIDER_SITE_OTHER): Payer: BC Managed Care – PPO | Admitting: Family Medicine

## 2013-07-18 VITALS — BP 158/104 | HR 101 | Temp 98.9°F | Ht 70.0 in | Wt 222.0 lb

## 2013-07-18 DIAGNOSIS — J209 Acute bronchitis, unspecified: Secondary | ICD-10-CM

## 2013-07-18 MED ORDER — LEVOFLOXACIN 500 MG PO TABS: 500.0000 mg | ORAL_TABLET | Freq: Every day | ORAL | Status: AC

## 2013-07-18 MED ORDER — HYDROCODONE-HOMATROPINE 5-1.5 MG/5ML PO SYRP
5.0000 mL | ORAL_SOLUTION | ORAL | Status: DC | PRN
Start: 2013-07-18 — End: 2013-09-22

## 2013-07-18 NOTE — Progress Notes (Signed)
   Subjective:    Patient ID: Bryan Smith, male    DOB: Apr 23, 1955, 58 y.o.   MRN: [id number]  HPI Here for recurrent URI sx. He was here on 07-01-13 with a bronchitis and he seemed to get overt his with a Zpack. Then he went on a plane flight last week and got sick again. Now he has chest congestion and is coughing up yellow sputum. No fever.    Review of Systems  Constitutional: Negative.   HENT: Positive for congestion. Negative for postnasal drip and sinus pressure.   Eyes: Negative.   Respiratory: Positive for cough and chest tightness.        Objective:   Physical Exam  Constitutional: He appears well-developed and well-nourished.  HENT:  Right Ear: External ear normal.  Left Ear: External ear normal.  Nose: Nose normal.  Mouth/Throat: Oropharynx is clear and moist.  Eyes: Conjunctivae are normal.  Pulmonary/Chest: Effort normal and breath sounds normal. No respiratory distress. He has no wheezes. He has no rales.  Lymphadenopathy:    He has no cervical adenopathy.          Assessment & Plan:  Try levaquin. Recheck prn

## 2013-07-18 NOTE — Progress Notes (Signed)
Pre visit review using our clinic review tool, if applicable. No additional management support is needed unless otherwise documented below in the visit note. 

## 2013-08-03 ENCOUNTER — Telehealth: Payer: Self-pay | Admitting: Family Medicine

## 2013-08-03 NOTE — Telephone Encounter (Signed)
Patient Information:  Caller Name: Dara  Phone: [phone]  Patient: Bryan Smith  Gender: Male  DOB: [birth year]  Age: 58 Years  PCP: Alysia Penna [facility])  Office Follow Up:  Does the office need to follow up with this patient?: Yes  Instructions For The Office: Patient is concerned about ongoing symptoms lasting so long.  (office visits January, March , April and May for Bronchitis)  cough continues.  Please review with physician and advise.  Does he need to see patient again?  RN Note:  Patient is concerned about ongoing symptoms lasting so long.  (office visits January, March , April and May for Bronchitis)  cough continues.  Please review with physician and advise.  Does he need to see patient again?  Symptoms  Reason For Call & Symptoms: Patient states he has been seen in the office several times. (office visit January, March , April and May)  He continues Intermittent  productive  clear /white  nagging cough. Afebrile.  He states he feels fine, decreased energy and cough continues with use of voice.  He feels after several months and different antibiotics, he should be better.  He is concerned since last visit was 07/18/13 and he completed Levaquin and Hydromet.  Reviewed Health History In EMR: Yes  Reviewed Medications In EMR: Yes  Reviewed Allergies In EMR: Yes  Reviewed Surgeries / Procedures: Yes  Date of Onset of Symptoms: 07/18/2013  Guideline(s) Used:  Cough  Disposition Per Guideline:   See Within 3 Days in Office  Reason For Disposition Reached:   Cough has been present for > 10 days  Advice Given:  Reassurance  Coughing is the way that our lungs remove irritants and mucus. It helps protect our lungs from getting pneumonia.  You can get a dry hacking cough after a chest cold. Sometimes this type of cough can last 1-3 weeks, and be worse at night.  Cough Medicines:  OTC Cough Drops: Cough drops can help a lot, especially for mild coughs. They  reduce coughing by soothing your irritated throat and removing that tickle sensation in the back of the throat. Cough drops also have the advantage of portability - you can carry them with you.  Home Remedy - Hard Candy: Hard candy works just as well as medicine-flavored OTC cough drops. Diabetics should use sugar-free candy.  Home Remedy - Honey: This old home remedy has been shown to help decrease coughing at night. The adult dosage is 2 teaspoons (10 ml) at bedtime. Honey should not be given to infants under one year of age.  Prevent Dehydration:  Drink adequate liquids.  This will help soothe an irritated or dry throat and loosen up the phlegm.  Avoid Tobacco Smoke:  Smoking or being exposed to smoke makes coughs much worse.  Call Back If:  Difficulty breathing  You become worse.  RN Overrode Recommendation:  Document Patient  Patient is concerned about ongoing symptoms lasting so long.  (office visits January, March , April and May for Bronchitis)  cough continues.  Please review with physician and advise.  Does he need to see patient again?

## 2013-08-03 NOTE — Telephone Encounter (Signed)
Per Dr.Fry, we can see pt next week. I spoke with pt and he will schedule the office visit.

## 2013-08-05 ENCOUNTER — Encounter: Payer: Self-pay | Admitting: Family Medicine

## 2013-08-05 ENCOUNTER — Ambulatory Visit (INDEPENDENT_AMBULATORY_CARE_PROVIDER_SITE_OTHER): Payer: BC Managed Care – PPO | Admitting: Family Medicine

## 2013-08-05 VITALS — BP 167/103 | HR 99 | Temp 98.1°F | Ht 70.0 in | Wt 225.0 lb

## 2013-08-05 DIAGNOSIS — R059 Cough, unspecified: Secondary | ICD-10-CM

## 2013-08-05 DIAGNOSIS — R05 Cough: Secondary | ICD-10-CM

## 2013-08-05 MED ORDER — BENZONATATE 200 MG PO CAPS: 200.0000 mg | ORAL_CAPSULE | Freq: Two times a day (BID) | ORAL | Status: DC | PRN

## 2013-08-05 NOTE — Progress Notes (Signed)
   Subjective:    Patient ID: ROSEMARY PENTECOST, male    DOB: 02/26/1956, 58 y.o.   MRN: [id number]  HPI Here for continued coughing. He was here for bronchitis sx recently and felt bad. He took a course of Levaquin and now feels much better. However he still has a dry tickling cough. No GERD sx, he takes Nexium daily.    Review of Systems  Constitutional: Negative.   HENT: Negative.   Eyes: Negative.   Respiratory: Positive for cough. Negative for choking, chest tightness, shortness of breath and wheezing.   Cardiovascular: Negative.        Objective:   Physical Exam  Constitutional: He appears well-developed and well-nourished.  HENT:  Right Ear: External ear normal.  Left Ear: External ear normal.  Nose: Nose normal.  Mouth/Throat: Oropharynx is clear and moist.  Eyes: Conjunctivae are normal.  Pulmonary/Chest: Effort normal and breath sounds normal. No respiratory distress. He has no wheezes. He has no rales.  Lymphadenopathy:    He has no cervical adenopathy.          Assessment & Plan:  Possible allergies vs chronic cough syndrome. Try taking an antihistamine like Claritin daily along with Benzonatate bid for a week or two. Recheck prn

## 2013-08-05 NOTE — Progress Notes (Signed)
Pre visit review using our clinic review tool, if applicable. No additional management support is needed unless otherwise documented below in the visit note. 

## 2013-08-09 ENCOUNTER — Encounter: Payer: Self-pay | Admitting: Gastroenterology

## 2013-09-14 ENCOUNTER — Telehealth: Payer: Self-pay | Admitting: Family Medicine

## 2013-09-14 NOTE — Telephone Encounter (Signed)
done

## 2013-09-14 NOTE — Telephone Encounter (Signed)
[id number] [place], [place] - [address] sent a PA request for Brand Nexium 40 mg.  It has been approved for 1 year but it isn't on the pt's current med list.

## 2013-09-22 ENCOUNTER — Ambulatory Visit (INDEPENDENT_AMBULATORY_CARE_PROVIDER_SITE_OTHER): Payer: BC Managed Care – PPO | Admitting: Family Medicine

## 2013-09-22 ENCOUNTER — Encounter: Payer: Self-pay | Admitting: Family Medicine

## 2013-09-22 VITALS — BP 148/86 | Temp 98.1°F | Ht 70.0 in | Wt 227.0 lb

## 2013-09-22 DIAGNOSIS — E119 Type 2 diabetes mellitus without complications: Secondary | ICD-10-CM

## 2013-09-22 DIAGNOSIS — R030 Elevated blood-pressure reading, without diagnosis of hypertension: Secondary | ICD-10-CM

## 2013-09-22 LAB — BASIC METABOLIC PANEL
BUN: 13 mg/dL (ref 6–23)
CALCIUM: 9 mg/dL (ref 8.4–10.5)
CO2: 33 mEq/L — ABNORMAL HIGH (ref 19–32)
Chloride: 105 mEq/L (ref 96–112)
Creatinine, Ser: 1.5 mg/dL (ref 0.4–1.5)
GFR: 63.23 mL/min (ref >60.00)
Glucose, Bld: 108 mg/dL — ABNORMAL HIGH (ref 70–99)
Potassium: 4.2 mEq/L (ref 3.5–5.1)
SODIUM: 140 meq/L (ref 135–145)

## 2013-09-22 LAB — LIPID PANEL
CHOL/HDL RATIO: 4
CHOLESTEROL: 151 mg/dL (ref 0–200)
HDL: 35.8 mg/dL — AB (ref >39.00)
LDL Cholesterol: 96 mg/dL (ref 0–99)
NonHDL: 115.2
TRIGLYCERIDES: 95 mg/dL (ref 0.0–149.0)
VLDL: 19 mg/dL (ref 0.0–40.0)

## 2013-09-22 LAB — HEMOGLOBIN A1C: Hgb A1c MFr Bld: 6.5 % (ref 4.6–6.5)

## 2013-09-22 MED ORDER — LISINOPRIL 20 MG PO TABS: 20.0000 mg | ORAL_TABLET | Freq: Every day | ORAL | Status: DC

## 2013-09-22 NOTE — Progress Notes (Signed)
Pre visit review using our clinic review tool, if applicable. No additional management support is needed unless otherwise documented below in the visit note. 

## 2013-09-22 NOTE — Progress Notes (Signed)
   Subjective:    Patient ID: Bryan Smith, male    DOB: 02-22-1956, 58 y.o.   MRN: [id number]  HPI Here to follow up. He finally stopped coughing and he feels great.   Review of Systems  Constitutional: Negative.   Respiratory: Negative.   Cardiovascular: Negative.        Objective:   Physical Exam  Constitutional: He appears well-developed and well-nourished.  Cardiovascular: Normal rate, regular rhythm, normal heart sounds and intact distal pulses.   Pulmonary/Chest: Effort normal and breath sounds normal.          Assessment & Plan:  Start on Lisinopril. Get labs today

## 2013-10-03 ENCOUNTER — Telehealth: Payer: Self-pay | Admitting: Family Medicine

## 2013-10-03 NOTE — Telephone Encounter (Signed)
Stop the Lisinopril and switch to Losartan 50 mg daily. Call in one year supply. The cough should fade away over the next week.

## 2013-10-03 NOTE — Telephone Encounter (Signed)
Patient Information:  Caller Name: Yago  Phone: [phone]  Patient: Bryan Smith  Gender: Male  DOB: 26-Oct-1955  Age: 58 Years  PCP: Alysia Penna [facility])  Office Follow Up:  Does the office need to follow up with this patient?: Yes  Instructions For The Office: Pls see RN note  RN Note:  Intermittent Dry Cough, onset 1 week.  Pt recently started Lisinopril on 7-16.  Pt would like to ask Dr Sarajane Jews if he should be switched to different HTN med.  Discussed diet Pt should be on for HTN. All emergent sxs ruled out per Cough protocol, see w/n 3 days d/t taking an ACE inhibitor med.  [facility] [place]  Please review w/ Dr Sarajane Jews and f/u w/ Pt.  Symptoms  Reason For Call & Symptoms: Intermittent Dry Cough, onset 1 week.  Reviewed Health History In EMR: Yes  Reviewed Medications In EMR: Yes  Reviewed Allergies In EMR: Yes  Reviewed Surgeries / Procedures: Yes  Date of Onset of Symptoms: 09/26/2013  Guideline(s) Used:  Cough  Disposition Per Guideline:   See Within 3 Days in Office  Reason For Disposition Reached:   Taking an ACE Inhibitor medication (e.g., benazepril/LOTENSIN, captopril/CAPOTEN, enalapril/VASOTEC, lisinopril/ZESTRIL)  Advice Given:  N/A  Patient Will Follow Care Advice:  YES

## 2013-10-04 MED ORDER — LOSARTAN POTASSIUM 50 MG PO TABS: 50.0000 mg | ORAL_TABLET | Freq: Every day | ORAL | Status: DC

## 2013-10-04 NOTE — Telephone Encounter (Signed)
I spoke with pt and sent script e-scribe, also added Lisinopril to drug allergy list due to constant cough.

## 2013-10-06 ENCOUNTER — Telehealth: Payer: Self-pay | Admitting: Family Medicine

## 2013-10-06 NOTE — Telephone Encounter (Signed)
Patient Information:  Caller Name: Juwaun  Phone: [phone]  Patient: Bryan Smith  Gender: Male  DOB: 1955/09/27  Age: 58 Years  PCP: Alysia Penna [facility])  Office Follow Up:  Does the office need to follow up with this patient?: Yes  Instructions For The Office: After conferring with Dr. Sarajane Jews, please call pt back with his recommendation.  RN Note:  Pt reluctant to try OTC cough suppressant and requests an Rx for Ladona Ridgel (that he took in Spring for cough).  Symptoms  Reason For Call & Symptoms: Onset 10/01/2013 of coughing symptoms after starting ACEI.  Called in on 10/03/2013 with Losartan prescribed instead of the ACEI.  Still has non-productive cough and requests an Rx for Ladona Ridgel that he took in Spring for cough.  Reviewed Health History In EMR: Yes  Reviewed Medications In EMR: Yes  Reviewed Allergies In EMR: Yes  Reviewed Surgeries / Procedures: Yes  Date of Onset of Symptoms: 10/01/2013  Guideline(s) Used:  Cough  Disposition Per Guideline:   Home Care  Reason For Disposition Reached:   Cough with no complications  Advice Given:  Reassurance  Coughing is the way that our lungs remove irritants and mucus. It helps protect our lungs from getting pneumonia.  You can get a dry hacking cough after a chest cold. Sometimes this type of cough can last 1-3 weeks, and be worse at night.  You can also get a cough after being exposed to irritating substances like smoke, strong perfumes, and dust.  Here is some care advice that should help.  Cough Medicines:  OTC Cough Syrups: The most common cough suppressant in OTC cough medications is dextromethorphan. Often the letters "DM" appear in the name.  Home Remedy - Honey: This old home remedy has been shown to help decrease coughing at night. The adult dosage is 2 teaspoons (10 ml) at bedtime. Honey should not be given to infants under one year of age.  OTC Cough Syrup - Dextromethorphan:  Cough  syrups containing the cough suppressant dextromethorphan (DM) may help decrease your cough. Cough syrups work best for coughs that keep you awake at night. They can also sometimes help in the late stages of a respiratory infection when the cough is dry and hacking. They can be used along with cough drops.  Examples: Benylin, Robitussin DM, Vicks 44 Cough Relief  Read the package instructions for dosage, contraindications, and other important information.  Caution - Dextromethorphan:   Do not try to completely suppress coughs that produce mucus and phlegm. Remember that coughing is helpful in bringing up mucus from the lungs and preventing pneumonia.  Research Notes: Dextromethorphan in some research studies has been shown to reduce the frequency and severity of cough in adults (18 years or older) without significant adverse effects. However, other studies suggest that dextromethorphan is no better than placebo at reducing a cough.  Drug Abuse Potential: It should be noted that dextromethorphan has become a drug of abuse. This problem is seen most often in adolescents. Overdose symptoms can range from giggling and euphoria to hallucinations and coma.  CONTRAINDICATED: Do not take dextromethorphan if you are taking a monoamine oxidase (MAO) inhibitor now or in the past 2 weeks. Examples of MAO inhibitors include isocarboxazid (Marplan), phenelzine (Nardil), selegiline (Eldepryl, Emsam, Zelapar), and tranylcypromine (Parnate). Do not take dextromethorphan if you are taking venlafaxine (Effexor).  Coughing Spasms:  Drink warm fluids. Inhale warm mist (Reason: both relax the airway and loosen up the phlegm).  Prevent Dehydration:  Drink adequate liquids.  This will help soothe an irritated or dry throat and loosen up the phlegm.  Avoid Tobacco Smoke:  Smoking or being exposed to smoke makes coughs much worse.  Expected Course:   The expected course depends on what is causing the cough.  Viral bronchitis  (chest cold) causes a cough that lasts 1 to 3 weeks. Sometimes you may cough up lots of phlegm (sputum, mucus). The mucus can normally be white, gray, yellow, or green.  Call Back If:  Difficulty breathing  Cough lasts more than 3 weeks  Fever lasts > 3 days  You become worse.  Patient Refused Recommendation:  Patient Requests Prescription  Requests an Rx for Ladona Ridgel that he took in Spring for cough.

## 2013-10-07 MED ORDER — BENZONATATE 200 MG PO CAPS: 200.0000 mg | ORAL_CAPSULE | Freq: Two times a day (BID) | ORAL | Status: DC | PRN

## 2013-10-07 NOTE — Telephone Encounter (Signed)
Call in Benzonatate 200 mg to take bid prn cough, #60 with 2 rf

## 2013-10-07 NOTE — Telephone Encounter (Signed)
I spoke with pt and [facility] 

## 2013-10-14 ENCOUNTER — Telehealth: Payer: Self-pay | Admitting: Family Medicine

## 2013-10-14 NOTE — Telephone Encounter (Signed)
Patient Information:  Caller Name: Treshawn  Phone: [phone]  Patient: Bryan Smith  Gender: Male  DOB: Sep 22, 1955  Age: 58 Years  PCP: Alysia Penna [facility])  Office Follow Up:  Does the office need to follow up with this patient?: No  Instructions For The Office: N/A  RN Note:  Patient states he was prescribed Losartan 10/04/13 for HTN. Patient states he developed constipation after starting Losartan. Patient states last bowel movement was 10/14/13. Patient describes bowel movement as small, hard, brown bowel movement after straining. Patient is taking fluids well. Patient states he tried Herbal Tea without improvement. Care advice and diet advice given per guidelines. Patient advised to try warm prune juice, increased fluids. Patient advised Miralax if no improvement with dietary changes. Call back parameters reviewed. Patient verbalizes understanding.  Symptoms  Reason For Call & Symptoms: Constipation  Reviewed Health History In EMR: Yes  Reviewed Medications In EMR: Yes  Reviewed Allergies In EMR: Yes  Reviewed Surgeries / Procedures: Yes  Date of Onset of Symptoms: 10/06/2013  Treatments Tried: Herbal Tea  Treatments Tried Worked: No  Guideline(s) Used:  Constipation  Disposition Per Guideline:   Home Care  Reason For Disposition Reached:   Mild constipation  Advice Given:  General Constipation Instructions:  Eat a high fiber diet.  Drink adequate liquids.  Exercise regularly (even a daily 15 minute walk!).  High Fiber Diet:  Try to eat fresh fruit and vegetables at each meal (peas, prunes, citrus, apples, beans, corn).  Eat more grain foods (bran flakes, bran muffins, graham crackers, oatmeal, brown rice, and whole wheat bread). Popcorn is a source of fiber.  Liquids:  Drink 6-8 glasses of water a day (Caution: certain medical conditions require fluid restriction).  Prune juice is a natural laxative.  Avoid alcohol.  Osmotic Laxatives:  Miralax (polyethylene glycol 3350): Miralax is an "osmotic" agent which means that it binds water and causes water to be retained within the stool. You can use this laxative to treat occasional constipation. Do not use for more than 2 weeks without approval from your doctor. Generally, Miralax produces a bowel movement in 1 to 3 days. Side effects include diarrhea (especially at higher doses). If you are pregnant, discuss with your doctor before using. Available in the [place].  Call Back If:  Constipation continues (i.e., less than 3 BMs / week or straining more than 25% of the time) after following care advice for constipation for 2 weeks  You become worse  Patient Will Follow Care Advice:  YES

## 2013-10-18 ENCOUNTER — Ambulatory Visit (AMBULATORY_SURGERY_CENTER): Payer: Self-pay

## 2013-10-18 VITALS — Ht 71.0 in | Wt 227.4 lb

## 2013-10-18 DIAGNOSIS — Z800 Family history of malignant neoplasm of digestive organs: Secondary | ICD-10-CM

## 2013-10-18 DIAGNOSIS — Z86010 Personal history of colonic polyps: Secondary | ICD-10-CM

## 2013-10-18 MED ORDER — MOVIPREP 100 G PO SOLR: ORAL | Status: DC

## 2013-10-18 NOTE — Progress Notes (Signed)
Per pt, no allergies to soy or egg products.Pt not taking any weight loss meds or using  O2 at home. 

## 2013-10-28 ENCOUNTER — Encounter: Payer: Self-pay | Admitting: Gastroenterology

## 2013-11-01 ENCOUNTER — Ambulatory Visit (AMBULATORY_SURGERY_CENTER): Payer: BC Managed Care – PPO | Admitting: Gastroenterology

## 2013-11-01 ENCOUNTER — Encounter: Payer: Self-pay | Admitting: Gastroenterology

## 2013-11-01 VITALS — BP 133/80 | HR 79 | Temp 97.7°F | Resp 19 | Ht 71.0 in | Wt 227.0 lb

## 2013-11-01 DIAGNOSIS — D126 Benign neoplasm of colon, unspecified: Secondary | ICD-10-CM

## 2013-11-01 DIAGNOSIS — Z86010 Personal history of colonic polyps: Secondary | ICD-10-CM

## 2013-11-01 MED ORDER — SODIUM CHLORIDE 0.9 % IV SOLN: 500.0000 mL | INTRAVENOUS | Status: DC

## 2013-11-01 NOTE — Progress Notes (Signed)
Called to room to assist during endoscopic procedure.  Patient ID and intended procedure confirmed with present staff. Received instructions for my participation in the procedure from the performing physician.  

## 2013-11-01 NOTE — Patient Instructions (Signed)

## 2013-11-01 NOTE — Progress Notes (Signed)
Report to PACU, RN, vss, BBS= Clear.  

## 2013-11-01 NOTE — Op Note (Signed)
[place]  [address] [place], [id number]   COLONOSCOPY PROCEDURE REPORT PATIENT: Bryan Smith, Bryan Smith  MR#: [id number] BIRTHDATE: 03-Jun-1955 , 58  yrs. old GENDER: Male ENDOSCOPIST: Ladene Artist, MD, [facility] PROCEDURE DATE:  11/01/2013 PROCEDURE:   Colonoscopy with biopsy and snare polypectomy First Screening Colonoscopy - Avg.  risk and is 50 yrs.  old or older - No.  Prior Negative Screening - Now for repeat screening. N/A  History of Adenoma - Now for follow-up colonoscopy & has been > or = to 3 yrs.  Yes hx of adenoma.  Has been 3 or more years since last colonoscopy.  Polyps Removed Today? Yes. ASA CLASS:   Class II INDICATIONS:Patient's personal history of adenomatous colon polyps.  MEDICATIONS: MAC sedation, administered by CRNA and propofol (Diprivan) 270mg  IV DESCRIPTION OF PROCEDURE:   After the risks benefits and alternatives of the procedure were thoroughly explained, informed consent was obtained.  A digital rectal exam revealed no abnormalities of the rectum.   The LB [id number]  endoscope was introduced through the anus and advanced to the cecum, which was identified by both the appendix and ileocecal valve. No adverse events experienced.   The quality of the prep was good, using MoviPrep  The instrument was then slowly withdrawn as the colon was fully examined.  COLON FINDINGS: Two sessile polyps measuring 3-4 mm in size were found in the ascending colon.  A polypectomy was performed with cold forceps.  The resection was complete and the polyp tissue was completely retrieved.   Three sessile polyps measuring 5-6 mm in size were found in the transverse colon.  A polypectomy was performed with a cold snare.  The resection was complete and the polyp tissue was completely retrieved.   A sessile polyp measuring 4 mm in size was found in the transverse colon.  A polypectomy was performed with cold forceps.  The resection was complete and  the polyp tissue was completely retrieved.   Two semi-pedunculated polyps measuring 7 mm in size were found in the descending colon. A polypectomy was performed with a cold snare.  The resection was complete and the polyp tissue was completely retrieved.   Mild diverticulosis was noted in the sigmoid colon.   The colon was otherwise normal.  There was no diverticulosis, inflammation, polyps or cancers unless previously stated.  Retroflexed views revealed small internal hemorrhoids. The time to cecum=1 minutes 44 seconds.  Withdrawal time=14 minutes 34 seconds.  The scope was withdrawn and the procedure completed.  COMPLICATIONS: There were no complications.   ENDOSCOPIC IMPRESSION: 1.   Two sessile polyps measuring 3-4 mm in the ascending colon; polypectomy performed with cold forceps 2.   Three sessile polyps measuring 5-6 mm in the transverse colon; polypectomy performed with a cold snare 3.   Sessile polyp measuring 4 mm in the transverse colon; polypectomy performed with cold forceps 4.   Two semi-pedunculated polyps measuring 7 mm in the descending colon; polypectomy performed with a cold snare 5.   Mild diverticulosis in the sigmoid colon 6.   Small internal hemorrhoids  RECOMMENDATIONS: 1.  Await pathology results pending pathology review 2.  Repeat Colonoscopy in 3 years. 3.  High fiber diet with liberal fluid intake.  eSigned:  Ladene Artist, MD, [facility] 11/01/2013 8:28 AM      PATIENT NAME:  Bryan, Smith MR#: [id number]

## 2013-11-02 ENCOUNTER — Telehealth: Payer: Self-pay | Admitting: *Deleted

## 2013-11-02 NOTE — Telephone Encounter (Signed)
  Follow up Call-  Call back number 11/01/2013  Post procedure Call Back phone  # [phone]  Permission to leave phone message Yes     Patient questions:  Do you have a fever, pain , or abdominal swelling? No. Pain Score  0 *  Have you tolerated food without any problems? Yes.    Have you been able to return to your normal activities? Yes.    Do you have any questions about your discharge instructions: Diet   No. Medications  No. Follow up visit  No.  Do you have questions or concerns about your Care? No.  Actions: * If pain score is 4 or above: No action needed, pain <4.

## 2013-11-07 ENCOUNTER — Encounter: Payer: Self-pay | Admitting: Gastroenterology

## 2013-12-23 ENCOUNTER — Other Ambulatory Visit: Payer: Self-pay

## 2014-01-21 ENCOUNTER — Other Ambulatory Visit: Payer: Self-pay | Admitting: Family Medicine

## 2014-03-27 ENCOUNTER — Ambulatory Visit (INDEPENDENT_AMBULATORY_CARE_PROVIDER_SITE_OTHER): Payer: BC Managed Care – PPO | Admitting: Family Medicine

## 2014-03-27 ENCOUNTER — Encounter: Payer: Self-pay | Admitting: Family Medicine

## 2014-03-27 VITALS — BP 132/87 | HR 106 | Temp 98.2°F | Ht 71.0 in | Wt 221.0 lb

## 2014-03-27 DIAGNOSIS — J0100 Acute maxillary sinusitis, unspecified: Secondary | ICD-10-CM

## 2014-03-27 MED ORDER — TRAMADOL HCL 50 MG PO TABS: 100.0000 mg | ORAL_TABLET | Freq: Four times a day (QID) | ORAL | Status: DC | PRN

## 2014-03-27 MED ORDER — LEVOFLOXACIN 500 MG PO TABS: 500.0000 mg | ORAL_TABLET | Freq: Every day | ORAL | Status: AC

## 2014-03-27 NOTE — Progress Notes (Signed)
   Subjective:    Patient ID: Bryan Smith, male    DOB: 04/06/1955, 59 y.o.   MRN: [id number]  HPI Here for 5 days of sinus pressure and HA on the left side of the head. Some PND and ST. No cough or fever.    Review of Systems  Constitutional: Negative.   HENT: Positive for congestion, postnasal drip and sinus pressure.   Eyes: Negative.   Respiratory: Negative.        Objective:   Physical Exam  Constitutional: He appears well-developed and well-nourished.  HENT:  Right Ear: External ear normal.  Left Ear: External ear normal.  Nose: Nose normal.  Mouth/Throat: Oropharynx is clear and moist.  Eyes: Conjunctivae are normal.  Pulmonary/Chest: Effort normal and breath sounds normal.  Lymphadenopathy:    He has no cervical adenopathy.          Assessment & Plan:  Add Mucinex

## 2014-03-27 NOTE — Progress Notes (Signed)
Pre visit review using our clinic review tool, if applicable. No additional management support is needed unless otherwise documented below in the visit note. 

## 2014-05-29 ENCOUNTER — Telehealth: Payer: Self-pay | Admitting: Family Medicine

## 2014-05-29 NOTE — Telephone Encounter (Signed)
Patient would like to know if he should get the shingles injection?  He states he was visiting his father who called him 2 day later to advise he had shingles and patient never had chicken pots. He is also, going to check with his insurance company to see if it is covered.

## 2014-05-30 NOTE — Telephone Encounter (Signed)
I spoke with pt  

## 2014-05-30 NOTE — Telephone Encounter (Signed)
Yes he should get the shingles shot

## 2014-06-09 ENCOUNTER — Telehealth: Payer: Self-pay

## 2014-06-09 ENCOUNTER — Ambulatory Visit: Payer: BC Managed Care – PPO | Admitting: Family Medicine

## 2014-06-09 NOTE — Telephone Encounter (Signed)
Pt came into the office today for a shingles vaccine.  Pt states he never had chicken pox as a child and that his father had shingles about 1 week ago, and he was going to visit him this weekend.  Spoke with Dr. Yong Channel and he states that pt does not need the shingles vaccine because pt never had chicken pox but the pt could possibly get chicken pox from being a round his father.   Advised pt of recommendations and pt verbalized understanding.  Appointment cancelled.

## 2014-08-14 ENCOUNTER — Ambulatory Visit (INDEPENDENT_AMBULATORY_CARE_PROVIDER_SITE_OTHER): Payer: BC Managed Care – PPO | Admitting: Family Medicine

## 2014-08-14 ENCOUNTER — Encounter: Payer: Self-pay | Admitting: Family Medicine

## 2014-08-14 VITALS — BP 120/80 | HR 92 | Temp 98.2°F | Wt 229.0 lb

## 2014-08-14 DIAGNOSIS — H6992 Unspecified Eustachian tube disorder, left ear: Secondary | ICD-10-CM | POA: Diagnosis not present

## 2014-08-14 DIAGNOSIS — R030 Elevated blood-pressure reading, without diagnosis of hypertension: Secondary | ICD-10-CM | POA: Diagnosis not present

## 2014-08-14 DIAGNOSIS — Z23 Encounter for immunization: Secondary | ICD-10-CM | POA: Diagnosis not present

## 2014-08-14 DIAGNOSIS — R5383 Other fatigue: Secondary | ICD-10-CM

## 2014-08-14 MED ORDER — LOSARTAN POTASSIUM 25 MG PO TABS: 25.0000 mg | ORAL_TABLET | Freq: Every day | ORAL | Status: DC

## 2014-08-14 NOTE — Progress Notes (Signed)
   Subjective:    Patient ID: Bryan Smith, male    DOB: 1955/11/24, 59 y.o.   MRN: [id number]  HPI Here for several issues. First he has felt tired the past month and his BP has been running low, averaging from 409 [id number] systolic. His pharmacy recently switched his Losartan to a different generic manufacturer. Also he has noticed popping and pressure in the left ear for several weeks. No other sinus sx.    Review of Systems  Constitutional: Positive for fatigue.  HENT: Positive for congestion. Negative for ear discharge, ear pain, hearing loss and sinus pressure.   Eyes: Negative.   Respiratory: Negative.   Cardiovascular: Negative.        Objective:   Physical Exam  Constitutional: He appears well-developed and well-nourished.  HENT:  Right Ear: External ear normal.  Left Ear: External ear normal.  Nose: Nose normal.  Mouth/Throat: Oropharynx is clear and moist.  Eyes: Conjunctivae are normal.  Cardiovascular: Normal rate, regular rhythm, normal heart sounds and intact distal pulses.   Pulmonary/Chest: Effort normal and breath sounds normal.  Lymphadenopathy:    He has no cervical adenopathy.          Assessment & Plan:  His BP has been running too low so we will decrease the Losartan to 25 mg daily. For the eustachian tubes he will try Claritin daily.

## 2014-08-14 NOTE — Progress Notes (Signed)
Pre visit review using our clinic review tool, if applicable. No additional management support is needed unless otherwise documented below in the visit note. 

## 2014-08-18 ENCOUNTER — Other Ambulatory Visit (INDEPENDENT_AMBULATORY_CARE_PROVIDER_SITE_OTHER): Payer: BC Managed Care – PPO

## 2014-08-18 DIAGNOSIS — Z0000 Encounter for general adult medical examination without abnormal findings: Secondary | ICD-10-CM | POA: Diagnosis not present

## 2014-08-18 LAB — LIPID PANEL
CHOL/HDL RATIO: 4
Cholesterol: 133 mg/dL (ref 0–200)
HDL: 33.8 mg/dL — ABNORMAL LOW (ref >39.00)
LDL Cholesterol: 82 mg/dL (ref 0–99)
NONHDL: 99.2
Triglycerides: 88 mg/dL (ref 0.0–149.0)
VLDL: 17.6 mg/dL (ref 0.0–40.0)

## 2014-08-18 LAB — CBC WITH DIFFERENTIAL/PLATELET
BASOS ABS: 0 10*3/uL (ref 0.0–0.1)
BASOS PCT: 0.5 % (ref 0.0–3.0)
EOS PCT: 3.4 % (ref 0.0–5.0)
Eosinophils Absolute: 0.1 10*3/uL (ref 0.0–0.7)
HEMATOCRIT: 41.2 % (ref 39.0–52.0)
HEMOGLOBIN: 13.2 g/dL (ref 13.0–17.0)
Lymphocytes Relative: 27.8 % (ref 12.0–46.0)
Lymphs Abs: 1 10*3/uL (ref 0.7–4.0)
MCHC: 32.1 g/dL (ref 30.0–36.0)
MCV: 78.7 fl (ref 78.0–100.0)
MONO ABS: 0.7 10*3/uL (ref 0.1–1.0)
Monocytes Relative: 19.1 % — ABNORMAL HIGH (ref 3.0–12.0)
Neutro Abs: 1.8 10*3/uL (ref 1.4–7.7)
Neutrophils Relative %: 49.2 % (ref 43.0–77.0)
PLATELETS: 255 10*3/uL (ref 150.0–400.0)
RBC: 5.23 Mil/uL (ref 4.22–5.81)
RDW: 16.4 % — AB (ref 11.5–15.5)
WBC: 3.7 10*3/uL — AB (ref 4.0–10.5)

## 2014-08-18 LAB — BASIC METABOLIC PANEL
BUN: 12 mg/dL (ref 6–23)
CO2: 26 meq/L (ref 19–32)
Calcium: 8.6 mg/dL (ref 8.4–10.5)
Chloride: 107 mEq/L (ref 96–112)
Creatinine, Ser: 1.3 mg/dL (ref 0.40–1.50)
GFR: 72.08 mL/min (ref >60.00)
Glucose, Bld: 118 mg/dL — ABNORMAL HIGH (ref 70–99)
POTASSIUM: 4.2 meq/L (ref 3.5–5.1)
Sodium: 139 mEq/L (ref 135–145)

## 2014-08-18 LAB — POCT URINALYSIS DIPSTICK
Bilirubin, UA: NEGATIVE
Blood, UA: NEGATIVE
Glucose, UA: NEGATIVE
KETONES UA: NEGATIVE
Leukocytes, UA: NEGATIVE
Nitrite, UA: NEGATIVE
PROTEIN UA: NEGATIVE
Spec Grav, UA: 1.025
Urobilinogen, UA: 0.2
pH, UA: 5.5

## 2014-08-18 LAB — HEPATIC FUNCTION PANEL
ALBUMIN: 3.4 g/dL — AB (ref 3.5–5.2)
ALT: 16 U/L (ref 0–53)
AST: 20 U/L (ref 0–37)
Alkaline Phosphatase: 71 U/L (ref 39–117)
Bilirubin, Direct: 0.1 mg/dL (ref 0.0–0.3)
Total Bilirubin: 0.2 mg/dL (ref 0.2–1.2)
Total Protein: 5.5 g/dL — ABNORMAL LOW (ref 6.0–8.3)

## 2014-08-18 LAB — TSH: TSH: 1.86 u[IU]/mL (ref 0.35–4.50)

## 2014-08-18 LAB — PSA: PSA: 1.26 ng/mL (ref 0.10–4.00)

## 2014-08-22 ENCOUNTER — Encounter: Payer: Self-pay | Admitting: Gastroenterology

## 2014-08-25 ENCOUNTER — Encounter: Payer: Self-pay | Admitting: Family Medicine

## 2014-08-25 ENCOUNTER — Ambulatory Visit (INDEPENDENT_AMBULATORY_CARE_PROVIDER_SITE_OTHER): Payer: BC Managed Care – PPO | Admitting: Family Medicine

## 2014-08-25 VITALS — BP 124/79 | HR 77 | Temp 98.1°F | Ht 71.0 in | Wt 225.0 lb

## 2014-08-25 DIAGNOSIS — Z0000 Encounter for general adult medical examination without abnormal findings: Secondary | ICD-10-CM | POA: Diagnosis not present

## 2014-08-25 NOTE — Progress Notes (Signed)
Pre visit review using our clinic review tool, if applicable. No additional management support is needed unless otherwise documented below in the visit note. 

## 2014-08-25 NOTE — Progress Notes (Signed)
   Subjective:    Patient ID: Bryan Smith, male    DOB: 1956/01/29, 59 y.o.   MRN: [id number]  HPI 59 yr old male for a cpx. He feels well. Last time we decreased the dose of the Losartan and this has worked well for him. His BP is normal and he feels better.    Review of Systems  Constitutional: Negative.   HENT: Negative.   Eyes: Negative.   Respiratory: Negative.   Cardiovascular: Negative.   Gastrointestinal: Negative.   Genitourinary: Negative.   Musculoskeletal: Negative.   Skin: Negative.   Neurological: Negative.   Psychiatric/Behavioral: Negative.        Objective:   Physical Exam  Constitutional: He is oriented to person, place, and time. He appears well-developed and well-nourished. No distress.  HENT:  Head: Normocephalic and atraumatic.  Right Ear: External ear normal.  Left Ear: External ear normal.  Nose: Nose normal.  Mouth/Throat: Oropharynx is clear and moist. No oropharyngeal exudate.  Eyes: Conjunctivae and EOM are normal. Pupils are equal, round, and reactive to light. Right eye exhibits no discharge. Left eye exhibits no discharge. No scleral icterus.  Neck: Neck supple. No JVD present. No tracheal deviation present. No thyromegaly present.  Cardiovascular: Normal rate, regular rhythm, normal heart sounds and intact distal pulses.  Exam reveals no gallop and no friction rub.   No murmur heard. EKG normal   Pulmonary/Chest: Effort normal and breath sounds normal. No respiratory distress. He has no wheezes. He has no rales. He exhibits no tenderness.  Abdominal: Soft. Bowel sounds are normal. He exhibits no distension and no mass. There is no tenderness. There is no rebound and no guarding.  Genitourinary: Rectum normal, prostate normal and penis normal. Guaiac negative stool. No penile tenderness.  Musculoskeletal: Normal range of motion. He exhibits no edema or tenderness.  Lymphadenopathy:    He has no cervical adenopathy.  Neurological: He is  alert and oriented to person, place, and time. He has normal reflexes. No cranial nerve deficit. He exhibits normal muscle tone. Coordination normal.  Skin: Skin is warm and dry. No rash noted. He is not diaphoretic. No erythema. No pallor.  Psychiatric: He has a normal mood and affect. His behavior is normal. Judgment and thought content normal.          Assessment & Plan:  Well exam. We discussed diet and exercise so he can lose some weight.

## 2014-09-04 ENCOUNTER — Other Ambulatory Visit: Payer: Self-pay

## 2014-09-05 ENCOUNTER — Ambulatory Visit (INDEPENDENT_AMBULATORY_CARE_PROVIDER_SITE_OTHER): Payer: BC Managed Care – PPO | Admitting: Family Medicine

## 2014-09-05 ENCOUNTER — Encounter: Payer: Self-pay | Admitting: Family Medicine

## 2014-09-05 VITALS — BP 125/81 | HR 63 | Temp 98.1°F | Ht 71.0 in | Wt 225.0 lb

## 2014-09-05 DIAGNOSIS — L918 Other hypertrophic disorders of the skin: Secondary | ICD-10-CM | POA: Diagnosis not present

## 2014-09-05 NOTE — Progress Notes (Signed)
Pre visit review using our clinic review tool, if applicable. No additional management support is needed unless otherwise documented below in the visit note. 

## 2014-09-05 NOTE — Progress Notes (Signed)
   Subjective:    Patient ID: Bryan Smith, male    DOB: 01-16-1956, 59 y.o.   MRN: [id number]  HPI Here to remove some skin tags. These get irritated by rubbing against clothing.    Review of Systems  Constitutional: Negative.        Objective:   Physical Exam  Constitutional: He appears well-developed and well-nourished.  Skin:  There are 3 small skin tags in the left axilla, and there is one larger tag on the left inner thigh          Assessment & Plan:  Skin tags. All 4 were excised with scissors.

## 2014-09-25 ENCOUNTER — Other Ambulatory Visit: Payer: Self-pay | Admitting: Family Medicine

## 2015-01-05 ENCOUNTER — Encounter: Payer: Self-pay | Admitting: Family Medicine

## 2015-01-05 NOTE — Telephone Encounter (Signed)
Have him make an OV to discuss this  

## 2015-01-09 NOTE — Telephone Encounter (Signed)
Can you call pt to schedule this? 

## 2015-01-16 ENCOUNTER — Ambulatory Visit (INDEPENDENT_AMBULATORY_CARE_PROVIDER_SITE_OTHER): Payer: BC Managed Care – PPO | Admitting: Family Medicine

## 2015-01-16 ENCOUNTER — Encounter: Payer: Self-pay | Admitting: Family Medicine

## 2015-01-16 VITALS — BP 134/88 | HR 88 | Temp 98.3°F | Ht 71.0 in | Wt 225.0 lb

## 2015-01-16 DIAGNOSIS — F411 Generalized anxiety disorder: Secondary | ICD-10-CM | POA: Diagnosis not present

## 2015-01-16 DIAGNOSIS — R002 Palpitations: Secondary | ICD-10-CM | POA: Diagnosis not present

## 2015-01-16 DIAGNOSIS — I10 Essential (primary) hypertension: Secondary | ICD-10-CM | POA: Diagnosis not present

## 2015-01-16 DIAGNOSIS — G8929 Other chronic pain: Secondary | ICD-10-CM | POA: Insufficient documentation

## 2015-01-16 DIAGNOSIS — M542 Cervicalgia: Secondary | ICD-10-CM | POA: Diagnosis not present

## 2015-01-16 NOTE — Progress Notes (Signed)
   Subjective:    Patient ID: Bryan Smith, male    DOB: 08-17-1955, 59 y.o.   MRN: [id number]  HPI Here to follow up on HTN and to discuss other issues. His BP has been going up at home lately and he thinks the stress is part of the problem. He has also been dealing with stiffness and pain in the neck and upper back. Also he gets palpitations at times when he feels anxious. No chest pain or SOB.    Review of Systems  Constitutional: Negative.   Respiratory: Negative.   Cardiovascular: Positive for palpitations. Negative for chest pain and leg swelling.  Musculoskeletal: Positive for neck pain and neck stiffness.  Neurological: Negative.   Psychiatric/Behavioral: Negative for confusion, dysphoric mood and decreased concentration. The patient is nervous/anxious.        Objective:   Physical Exam  Constitutional: He is oriented to person, place, and time. He appears well-developed and well-nourished.  Neck: No thyromegaly present.  Cardiovascular: Normal rate, regular rhythm, normal heart sounds and intact distal pulses.   Pulmonary/Chest: Effort normal and breath sounds normal.  Lymphadenopathy:    He has no cervical adenopathy.  Neurological: He is alert and oriented to person, place, and time.  Psychiatric: He has a normal mood and affect. His behavior is normal. Thought content normal.          Assessment & Plan:  He is having some physical manifestations of stress like palpitations, neck pain , and increased BP. He will take a few vacation days to relax. Use Advil for the neck pain, and he is seeing his chiropractor also. Recheck prn

## 2015-01-16 NOTE — Progress Notes (Signed)
Pre visit review using our clinic review tool, if applicable. No additional management support is needed unless otherwise documented below in the visit note. 

## 2015-02-05 ENCOUNTER — Encounter: Payer: Self-pay | Admitting: Family Medicine

## 2015-02-06 ENCOUNTER — Encounter: Payer: Self-pay | Admitting: Family Medicine

## 2015-02-06 NOTE — Telephone Encounter (Signed)
Call in #60 with 3 rf 

## 2015-02-08 MED ORDER — PROMETHAZINE HCL 25 MG PO TABS: ORAL_TABLET | ORAL | Status: DC

## 2015-03-14 ENCOUNTER — Telehealth: Payer: Self-pay | Admitting: Family Medicine

## 2015-03-14 NOTE — Telephone Encounter (Signed)
[place] [facility] - Client [facility] Patient Name: Bryan Smith DOB: 17-Dec-1955 Initial Comment Cough, congestion, loss of appetite, med ques Nurse Assessment Nurse: Mechele Dawley, RN, Amy Date/Time (Eastern Time): 03/14/2015 2:54:54 PM Confirm and document reason for call. If symptomatic, describe symptoms. ---COUGH AND CONGESTION. SYMPTOMS STARTED ABOUT A WEEK AGO. COUGHING UP CLEAR SECRETIONS - SOME YELLOWISH BROWN SECRETIONS. HE DID TAKE SOME MUCINEX MAX. CONGESTION IN NOSE AS WELL. Has the patient traveled out of the country within the last 30 days? ---Not Applicable Does the patient have any new or worsening symptoms? ---Yes Will a triage be completed? ---Yes Related visit to physician within the last 2 weeks? ---No Does the PT have any chronic conditions? (i.e. diabetes, asthma, etc.) ---Unknown Is this a behavioral health or substance abuse call? ---No Guidelines Guideline Title Affirmed Question Affirmed Notes Cough - Acute Productive Coughing up rusty-colored (reddish-brown) sputum Final Disposition User See Physician within [place], RN, Amy Referrals REFERRED TO PCP OFFICE Disagree/Comply: Comply

## 2015-03-15 ENCOUNTER — Ambulatory Visit (INDEPENDENT_AMBULATORY_CARE_PROVIDER_SITE_OTHER): Payer: BC Managed Care – PPO | Admitting: Family Medicine

## 2015-03-15 ENCOUNTER — Encounter: Payer: Self-pay | Admitting: Family Medicine

## 2015-03-15 VITALS — BP 138/76 | HR 90 | Temp 98.3°F | Wt 222.0 lb

## 2015-03-15 DIAGNOSIS — A499 Bacterial infection, unspecified: Secondary | ICD-10-CM | POA: Diagnosis not present

## 2015-03-15 DIAGNOSIS — J329 Chronic sinusitis, unspecified: Secondary | ICD-10-CM | POA: Diagnosis not present

## 2015-03-15 DIAGNOSIS — B9689 Other specified bacterial agents as the cause of diseases classified elsewhere: Secondary | ICD-10-CM

## 2015-03-15 MED ORDER — AZITHROMYCIN 250 MG PO TABS: ORAL_TABLET | ORAL | Status: DC

## 2015-03-15 NOTE — Progress Notes (Signed)
PCP: Laurey Morale, MD  Subjective:  Bryan Smith is a 60 y.o. year old very pleasant male patient who presents with sinusitis symptoms including nasal congestion with yellow discharge, sinus fullness -other symptoms include: loss of voice, productive cough sometimes clear sometimes with yellow or brown. No blood in cough -day of illness:10 -started: last Tuesday -Symptoms show no change -previous treatments: mucinex -sick contacts/travel/risks: denies flu exposure. Was at the beach and several other people were sick before patient was  ROS-denies fever, SOB, NVD, tooth pain  Pertinent Past Medical History- history sinusitis about once a year (records reviewed- last time treated with levaquin), type II diabetes, history bronchitis, hypertension, GERD  Medications- reviewed  Current Outpatient Prescriptions  Medication Sig Dispense Refill  . esomeprazole (NEXIUM) 40 MG capsule Take 40 mg by mouth as needed.     Marland Kitchen losartan (COZAAR) 25 MG tablet Take 1 tablet (25 mg total) by mouth daily. 30 tablet 11  . Multiple Vitamins-Minerals (CENTRUM SILVER PO) Take by mouth as needed.    . promethazine (PHENERGAN) 25 MG tablet TAKE 1 TABLET BY MOUTH EVERY 4 HOURS AS NEEDED NAUSEA (Patient not taking: Reported on 03/15/2015) 60 tablet 3   No current facility-administered medications for this visit.    Objective: BP 138/76 mmHg  Pulse 90  Temp(Src) 98.3 F (36.8 C)  Wt 222 lb (100.699 kg)  SpO2 98% Gen: NAD, resting comfortably, intermittent cough HEENT: Turbinates erythematous with yellow drainage, TM normal, pharynx mildly erythematous with no tonsilar exudate or edema, minimal sinus tenderness CV: RRR no murmurs rubs or gallops Lungs: CTAB no crackles, wheeze, rhonchi Abdomen: soft/nontender/nondistended/normal bowel sounds.   Ext: no edema Skin: warm, dry, no rash Neuro: grossly normal, moves all extremities  Assessment/Plan:  Sinsusitis Bacterial based on: Symptoms >10  days  Treatment: -considered prednisone: we opted out due to diabetes -other symptomatic care with mucinex -Antibiotic indicated: yes We discussed that sinus pressure is minimal and we could trial this without antibiotics as body is likely to clear this. Patient would like to have greatest chance to clear infection at this time. Discussed augmentin vs. Azithromycin as well. Patient concerned about potential SE including diarrhea of augmentin so opts for azitromycin.   Finally, we reviewed reasons to return to care including if symptoms worsen or persist or new concerns arise.  Meds ordered this encounter  Medications  . azithromycin (ZITHROMAX) 250 MG tablet    Sig: Take 2 tabs on day 1, then 1 tab daily until finished    Dispense:  6 tablet    Refill:  0

## 2015-03-15 NOTE — Patient Instructions (Addendum)
Sinsusitis Bacterial based on: Symptoms >10 days  Treatment: -considered prednisone: we opted out due to diabetes -other symptomatic care with mucinex -Antibiotic indicated: yes We discussed that sinus pressure is minimal and we could trial this without antibiotics as body is likely to clear this. Patient would like to have greatest chance to clear infection at this time. Discussed augmentin vs. Azithromycin as well. Patient concerned about potential SE including diarrhea of augmentin so opts for azitromycin.   Finally, we reviewed reasons to return to care including if symptoms worsen or persist or new concerns arise.  Meds ordered this encounter  Medications  . azithromycin (ZITHROMAX) 250 MG tablet    Sig: Take 2 tabs on day 1, then 1 tab daily until finished    Dispense:  6 tablet    Refill:  0

## 2015-03-20 ENCOUNTER — Telehealth: Payer: Self-pay | Admitting: Family Medicine

## 2015-03-20 MED ORDER — BENZONATATE 200 MG PO CAPS: 200.0000 mg | ORAL_CAPSULE | Freq: Two times a day (BID) | ORAL | Status: DC | PRN

## 2015-03-20 NOTE — Telephone Encounter (Signed)
Pt call to say he was on antibiotics up until yesterday for a upper resp infection he saw Dr Yong Channel last week. Pt call today to ask for cough medicine [place]

## 2015-03-20 NOTE — Telephone Encounter (Signed)
I sent script e-scribe and spoke with pt. 

## 2015-03-20 NOTE — Telephone Encounter (Signed)
Call in Benzonatate 200 mg to take bid prn cough. #60 with 2 rf

## 2015-03-29 ENCOUNTER — Encounter: Payer: Self-pay | Admitting: Family Medicine

## 2015-03-29 DIAGNOSIS — R05 Cough: Secondary | ICD-10-CM

## 2015-03-29 DIAGNOSIS — R053 Chronic cough: Secondary | ICD-10-CM

## 2015-03-29 NOTE — Telephone Encounter (Signed)
I have referred him to Pulmonary for the cough

## 2015-03-30 ENCOUNTER — Encounter: Payer: Self-pay | Admitting: Family Medicine

## 2015-03-30 ENCOUNTER — Ambulatory Visit (INDEPENDENT_AMBULATORY_CARE_PROVIDER_SITE_OTHER)
Admission: RE | Admit: 2015-03-30 | Discharge: 2015-03-30 | Disposition: A | Discharge status: Home / Self Care | Payer: BC Managed Care – PPO | Source: Ambulatory Visit | Attending: Family Medicine | Admitting: Family Medicine

## 2015-03-30 ENCOUNTER — Ambulatory Visit (INDEPENDENT_AMBULATORY_CARE_PROVIDER_SITE_OTHER): Payer: BC Managed Care – PPO | Admitting: Family Medicine

## 2015-03-30 VITALS — BP 132/90 | HR 94 | Temp 98.3°F | Ht 71.0 in | Wt 220.0 lb

## 2015-03-30 DIAGNOSIS — R059 Cough, unspecified: Secondary | ICD-10-CM

## 2015-03-30 DIAGNOSIS — R05 Cough: Secondary | ICD-10-CM

## 2015-03-30 MED ORDER — BECLOMETHASONE DIPROPIONATE 40 MCG/ACT IN AERS: 2.0000 | INHALATION_SPRAY | Freq: Two times a day (BID) | RESPIRATORY_TRACT | Status: DC

## 2015-03-30 NOTE — Progress Notes (Signed)
Pre visit review using our clinic review tool, if applicable. No additional management support is needed unless otherwise documented below in the visit note. 

## 2015-03-30 NOTE — Progress Notes (Signed)
   Subjective:    Patient ID: Bryan Smith, male    DOB: 08/20/1955, 60 y.o.   MRN: [id number]  HPI Here for a cough that has persisted for 3 weeks. It started with sinus symptoms including head congestion and blowing yellow mucus from the nose. No fever. He was seen here and was given a Zpack and Benzonatate. The sinus symptoms are gone but he as a persistent dry cough. No fever or SOB. No hx of asthma. No GERD symptoms.    Review of Systems  Constitutional: Negative.   HENT: Negative.   Eyes: Negative.   Respiratory: Positive for cough. Negative for chest tightness, shortness of breath and wheezing.   Cardiovascular: Negative.        Objective:   Physical Exam  Constitutional: He appears well-developed and well-nourished. No distress.  HENT:  Right Ear: External ear normal.  Left Ear: External ear normal.  Nose: Nose normal.  Mouth/Throat: Oropharynx is clear and moist.  Eyes: Conjunctivae are normal.  Neck: No thyromegaly present.  Cardiovascular: Normal rate, regular rhythm, normal heart sounds and intact distal pulses.   Pulmonary/Chest: Effort normal. No respiratory distress. He has no rales.  Soft wheezes in the upper zones   Musculoskeletal: He exhibits no edema.  Lymphadenopathy:    He has no cervical adenopathy.          Assessment & Plan:  Cough, possibly due to irritated airways. Get as CXR today. Try Qvar bid.

## 2015-03-30 NOTE — Telephone Encounter (Signed)
Patient notified. Thanks.

## 2015-04-10 ENCOUNTER — Encounter: Payer: Self-pay | Admitting: Family Medicine

## 2015-04-10 NOTE — Telephone Encounter (Signed)
My understanding is that we agreed to see a Pulmonary doctor about his cough that won't go away. We did not have a good explanation for it so we hoped they could shed more light on the issue. If he is no longer coughing then I suppose we could cancel the referral

## 2015-04-18 ENCOUNTER — Ambulatory Visit (INDEPENDENT_AMBULATORY_CARE_PROVIDER_SITE_OTHER): Payer: BC Managed Care – PPO | Admitting: Family Medicine

## 2015-04-18 ENCOUNTER — Encounter: Payer: Self-pay | Admitting: Family Medicine

## 2015-04-18 ENCOUNTER — Telehealth: Payer: Self-pay | Admitting: Family Medicine

## 2015-04-18 VITALS — BP 130/88 | HR 90 | Temp 98.5°F | Ht 71.0 in | Wt 225.0 lb

## 2015-04-18 DIAGNOSIS — J019 Acute sinusitis, unspecified: Secondary | ICD-10-CM | POA: Diagnosis not present

## 2015-04-18 MED ORDER — LEVOFLOXACIN 500 MG PO TABS: 500.0000 mg | ORAL_TABLET | Freq: Every day | ORAL | Status: AC

## 2015-04-18 NOTE — Progress Notes (Signed)
   Subjective:    Patient ID: Bryan Smith, male    DOB: [birth year], 60 y.o.   MRN: [id number]  HPI Here for 3 days of sinus pressure, blowing yellow mucus from the nose, and PND. No cough or fever.    Review of Systems  Constitutional: Negative.   HENT: Positive for congestion, postnasal drip, sinus pressure and sore throat. Negative for ear pain.   Eyes: Negative.   Respiratory: Negative.        Objective:   Physical Exam  Constitutional: He appears well-developed and well-nourished.  HENT:  Right Ear: External ear normal.  Left Ear: External ear normal.  Nose: Nose normal.  Mouth/Throat: Oropharynx is clear and moist.  Eyes: Conjunctivae are normal.  Pulmonary/Chest: Effort normal and breath sounds normal.  Lymphadenopathy:    He has no cervical adenopathy.          Assessment & Plan:  Sinusitis, treat with Levaquin.

## 2015-04-18 NOTE — Telephone Encounter (Signed)
Pt is on schedule for today to see Dr. Fry.  

## 2015-04-18 NOTE — Progress Notes (Signed)
Pre visit review using our clinic review tool, if applicable. No additional management support is needed unless otherwise documented below in the visit note. 

## 2015-04-18 NOTE — Telephone Encounter (Signed)
Patient has an appointment.

## 2015-04-18 NOTE — Telephone Encounter (Signed)
Patient Name: Bryan Smith  DOB: Oct 15, 1955    Initial Comment Caller states thinks he has a sinus infection- congestion , headache; drainage of sinus   Nurse Assessment  Nurse: Mallie Mussel, RN, Alveta Heimlich Date/Time (Eastern Time): 04/18/2015 10:19:54 AM  Confirm and document reason for call. If symptomatic, describe symptoms. You must click the next button to save text entered. ---Caller thinks he has a sinus infection. He has congestion, headache, soreness of his sinuses and sinus drainage. His symptoms began on Monday. He has recently had some kind of respiratory bug. Denies fever, but he does feel warm at times. He rates his pain as 6 on 0-10 scale and his worst pain is "right between the eyes."  Has the patient traveled out of the country within the last 30 days? ---No  Does the patient have any new or worsening symptoms? ---Yes  Will a triage be completed? ---Yes  Related visit to physician within the last 2 weeks? ---No  Does the PT have any chronic conditions? (i.e. diabetes, asthma, etc.) ---No  Is this a behavioral health or substance abuse call? ---No     Guidelines    Guideline Title Affirmed Question Affirmed Notes  Sinus Pain or Congestion [1] Sinus congestion as part of a cold AND [2] present < 10 days (all triage questions negative)    Final Disposition User   See Physician within [place], RN, Alveta Heimlich    Referrals  REFERRED TO PCP OFFICE   Disagree/Comply: Leta Baptist

## 2015-05-15 ENCOUNTER — Encounter: Payer: Self-pay | Admitting: Family Medicine

## 2015-05-16 NOTE — Telephone Encounter (Signed)
I am not sure how to answer this. What are his symptoms exactly?

## 2015-05-16 NOTE — Telephone Encounter (Signed)
Drink fluids, use Mucinex and Delsym prn. If he gets any worse, have an OV

## 2015-05-17 ENCOUNTER — Ambulatory Visit (INDEPENDENT_AMBULATORY_CARE_PROVIDER_SITE_OTHER): Payer: BC Managed Care – PPO | Admitting: Family Medicine

## 2015-05-17 ENCOUNTER — Encounter: Payer: Self-pay | Admitting: Family Medicine

## 2015-05-17 VITALS — BP 136/88 | HR 91 | Temp 98.7°F | Ht 72.0 in | Wt 222.0 lb

## 2015-05-17 DIAGNOSIS — J069 Acute upper respiratory infection, unspecified: Secondary | ICD-10-CM | POA: Diagnosis not present

## 2015-05-17 MED ORDER — HYDROCODONE-HOMATROPINE 5-1.5 MG/5ML PO SYRP: 5.0000 mL | ORAL_SOLUTION | ORAL | Status: DC | PRN

## 2015-05-17 NOTE — Progress Notes (Signed)
Pre visit review using our clinic review tool, if applicable. No additional management support is needed unless otherwise documented below in the visit note. 

## 2015-05-17 NOTE — Progress Notes (Signed)
   Subjective:    Patient ID: Bryan Smith, male    DOB: 05/03/1955, 60 y.o.   MRN: [id number]  HPI Here for 2 days of PND and a dry cough. No fever. Using Benzonatate and Delsym. He was treated here a month ago for a sinusitis and this totally resolved.    Review of Systems  Constitutional: Negative.   HENT: Positive for congestion and postnasal drip. Negative for ear pain, sinus pressure and sore throat.   Eyes: Negative.   Respiratory: Positive for cough.        Objective:   Physical Exam  Constitutional: He appears well-developed and well-nourished. No distress.  HENT:  Right Ear: External ear normal.  Left Ear: External ear normal.  Nose: Nose normal.  Mouth/Throat: Oropharynx is clear and moist.  Eyes: Conjunctivae are normal.  Neck: No thyromegaly present.  Cardiovascular: Normal rate, regular rhythm, normal heart sounds and intact distal pulses.   Pulmonary/Chest: Effort normal and breath sounds normal. No respiratory distress. He has no wheezes. He has no rales.  Lymphadenopathy:    He has no cervical adenopathy.          Assessment & Plan:  Viral URI. Drink fluids. Add Mucinex prn

## 2015-08-17 ENCOUNTER — Other Ambulatory Visit: Payer: Self-pay | Admitting: Family Medicine

## 2016-01-10 ENCOUNTER — Other Ambulatory Visit (INDEPENDENT_AMBULATORY_CARE_PROVIDER_SITE_OTHER): Payer: BC Managed Care – PPO

## 2016-01-10 DIAGNOSIS — R7989 Other specified abnormal findings of blood chemistry: Secondary | ICD-10-CM | POA: Diagnosis not present

## 2016-01-10 DIAGNOSIS — Z0000 Encounter for general adult medical examination without abnormal findings: Secondary | ICD-10-CM | POA: Diagnosis not present

## 2016-01-10 LAB — CBC WITH DIFFERENTIAL/PLATELET
BASOS ABS: 0 10*3/uL (ref 0.0–0.1)
BASOS PCT: 0.6 % (ref 0.0–3.0)
EOS ABS: 0.1 10*3/uL (ref 0.0–0.7)
Eosinophils Relative: 2.9 % (ref 0.0–5.0)
HEMATOCRIT: 44.1 % (ref 39.0–52.0)
Hemoglobin: 14.4 g/dL (ref 13.0–17.0)
LYMPHS PCT: 31.2 % (ref 12.0–46.0)
Lymphs Abs: 1.1 10*3/uL (ref 0.7–4.0)
MCHC: 32.5 g/dL (ref 30.0–36.0)
MCV: 79 fl (ref 78.0–100.0)
MONO ABS: 0.6 10*3/uL (ref 0.1–1.0)
Monocytes Relative: 18.2 % — ABNORMAL HIGH (ref 3.0–12.0)
NEUTROS ABS: 1.6 10*3/uL (ref 1.4–7.7)
NEUTROS PCT: 47.1 % (ref 43.0–77.0)
PLATELETS: 257 10*3/uL (ref 150.0–400.0)
RBC: 5.58 Mil/uL (ref 4.22–5.81)
RDW: 16.5 % — AB (ref 11.5–15.5)
WBC: 3.5 10*3/uL — ABNORMAL LOW (ref 4.0–10.5)

## 2016-01-10 LAB — POC URINALSYSI DIPSTICK (AUTOMATED)
BILIRUBIN UA: NEGATIVE
Blood, UA: NEGATIVE
GLUCOSE UA: NEGATIVE
KETONES UA: NEGATIVE
LEUKOCYTES UA: NEGATIVE
Nitrite, UA: NEGATIVE
Spec Grav, UA: 1.02
Urobilinogen, UA: 0.2
pH, UA: 5.5

## 2016-01-10 LAB — LDL CHOLESTEROL, DIRECT: Direct LDL: 78 mg/dL

## 2016-01-10 LAB — BASIC METABOLIC PANEL
BUN: 14 mg/dL (ref 6–23)
CALCIUM: 9.1 mg/dL (ref 8.4–10.5)
CHLORIDE: 104 meq/L (ref 96–112)
CO2: 29 meq/L (ref 19–32)
CREATININE: 1.27 mg/dL (ref 0.40–1.50)
GFR: 73.71 mL/min (ref >60.00)
Glucose, Bld: 142 mg/dL — ABNORMAL HIGH (ref 70–99)
Potassium: 4.7 mEq/L (ref 3.5–5.1)
Sodium: 140 mEq/L (ref 135–145)

## 2016-01-10 LAB — LIPID PANEL
CHOL/HDL RATIO: 4
Cholesterol: 150 mg/dL (ref 0–200)
HDL: 36.3 mg/dL — AB (ref >39.00)
NONHDL: 114.07
TRIGLYCERIDES: 226 mg/dL — AB (ref 0.0–149.0)
VLDL: 45.2 mg/dL — AB (ref 0.0–40.0)

## 2016-01-10 LAB — HEPATIC FUNCTION PANEL
ALK PHOS: 70 U/L (ref 39–117)
ALT: 16 U/L (ref 0–53)
AST: 14 U/L (ref 0–37)
Albumin: 3.5 g/dL (ref 3.5–5.2)
BILIRUBIN DIRECT: 0.1 mg/dL (ref 0.0–0.3)
BILIRUBIN TOTAL: 0.4 mg/dL (ref 0.2–1.2)
Total Protein: 5.7 g/dL — ABNORMAL LOW (ref 6.0–8.3)

## 2016-01-10 LAB — PSA: PSA: 1.53 ng/mL (ref 0.10–4.00)

## 2016-01-10 LAB — TSH: TSH: 2.58 u[IU]/mL (ref 0.35–4.50)

## 2016-01-17 ENCOUNTER — Encounter: Payer: Self-pay | Admitting: Family Medicine

## 2016-01-17 ENCOUNTER — Ambulatory Visit (INDEPENDENT_AMBULATORY_CARE_PROVIDER_SITE_OTHER): Payer: BC Managed Care – PPO | Admitting: Family Medicine

## 2016-01-17 VITALS — BP 124/78 | HR 92 | Temp 98.5°F | Ht 72.0 in | Wt 230.0 lb

## 2016-01-17 DIAGNOSIS — R739 Hyperglycemia, unspecified: Secondary | ICD-10-CM

## 2016-01-17 DIAGNOSIS — Z0000 Encounter for general adult medical examination without abnormal findings: Secondary | ICD-10-CM | POA: Diagnosis not present

## 2016-01-17 DIAGNOSIS — E669 Obesity, unspecified: Secondary | ICD-10-CM | POA: Insufficient documentation

## 2016-01-17 DIAGNOSIS — Z23 Encounter for immunization: Secondary | ICD-10-CM

## 2016-01-17 DIAGNOSIS — E1169 Type 2 diabetes mellitus with other specified complication: Secondary | ICD-10-CM | POA: Diagnosis not present

## 2016-01-17 LAB — HEMOGLOBIN A1C: Hgb A1c MFr Bld: 7 % — ABNORMAL HIGH (ref 4.6–6.5)

## 2016-01-17 MED ORDER — LOSARTAN POTASSIUM 25 MG PO TABS: ORAL_TABLET | ORAL | 3 refills | Status: DC

## 2016-01-17 NOTE — Addendum Note (Signed)
Addended by: Aggie Hacker A on: 01/17/2016 10:14 AM   Modules accepted: Orders

## 2016-01-17 NOTE — Addendum Note (Signed)
Addended by: Alysia Penna A on: 01/17/2016 01:28 PM   Modules accepted: Orders

## 2016-01-17 NOTE — Progress Notes (Signed)
Pre visit review using our clinic review tool, if applicable. No additional management support is needed unless otherwise documented below in the visit note. 

## 2016-01-17 NOTE — Progress Notes (Signed)
   Subjective:    Patient ID: Bryan Smith, male    DOB: 06/13/1955, 60 y.o.   MRN: [id number]  HPI 60 yr old male for a well exam. He feels well. He retired in March and he has kept busy since then. He enjoys playing tennis.    Review of Systems  Constitutional: Negative.   HENT: Negative.   Eyes: Negative.   Respiratory: Negative.   Cardiovascular: Negative.   Gastrointestinal: Negative.   Genitourinary: Negative.   Musculoskeletal: Negative.   Skin: Negative.   Neurological: Negative.   Psychiatric/Behavioral: Negative.        Objective:   Physical Exam  Constitutional: He is oriented to person, place, and time. He appears well-developed and well-nourished. No distress.  HENT:  Head: Normocephalic and atraumatic.  Right Ear: External ear normal.  Left Ear: External ear normal.  Nose: Nose normal.  Mouth/Throat: Oropharynx is clear and moist. No oropharyngeal exudate.  Eyes: Conjunctivae and EOM are normal. Pupils are equal, round, and reactive to light. Right eye exhibits no discharge. Left eye exhibits no discharge. No scleral icterus.  Neck: Neck supple. No JVD present. No tracheal deviation present. No thyromegaly present.  Cardiovascular: Normal rate, regular rhythm, normal heart sounds and intact distal pulses.  Exam reveals no gallop and no friction rub.   No murmur heard. EKG shows no change from baseline. He has NSR with a few PVCs   Pulmonary/Chest: Effort normal and breath sounds normal. No respiratory distress. He has no wheezes. He has no rales. He exhibits no tenderness.  Abdominal: Soft. Bowel sounds are normal. He exhibits no distension and no mass. There is no tenderness. There is no rebound and no guarding.  Genitourinary: Rectum normal, prostate normal and penis normal. Rectal exam shows guaiac negative stool. No penile tenderness.  Musculoskeletal: Normal range of motion. He exhibits no edema or tenderness.  Lymphadenopathy:    He has no cervical  adenopathy.  Neurological: He is alert and oriented to person, place, and time. He has normal reflexes. No cranial nerve deficit. He exhibits normal muscle tone. Coordination normal.  Skin: Skin is warm and dry. No rash noted. He is not diaphoretic. No erythema. No pallor.  Psychiatric: He has a normal mood and affect. His behavior is normal. Judgment and thought content normal.          Assessment & Plan:  Well exam. We discussed diet and exercise. His fasting glucose has jumped up so we will send him for his first A1c today.  Laurey Morale, MD

## 2016-01-18 ENCOUNTER — Other Ambulatory Visit: Payer: Self-pay | Admitting: Family Medicine

## 2016-01-18 MED ORDER — METFORMIN HCL 500 MG PO TABS: 500.0000 mg | ORAL_TABLET | Freq: Two times a day (BID) | ORAL | 5 refills | Status: DC

## 2016-02-05 ENCOUNTER — Encounter: Payer: BC Managed Care – PPO | Attending: Family Medicine | Admitting: *Deleted

## 2016-02-05 DIAGNOSIS — E119 Type 2 diabetes mellitus without complications: Secondary | ICD-10-CM | POA: Insufficient documentation

## 2016-02-05 DIAGNOSIS — E669 Obesity, unspecified: Secondary | ICD-10-CM | POA: Insufficient documentation

## 2016-02-05 DIAGNOSIS — Z6831 Body mass index (BMI) 31.0-31.9, adult: Secondary | ICD-10-CM | POA: Insufficient documentation

## 2016-02-05 DIAGNOSIS — Z713 Dietary counseling and surveillance: Secondary | ICD-10-CM | POA: Diagnosis not present

## 2016-02-05 DIAGNOSIS — E1169 Type 2 diabetes mellitus with other specified complication: Secondary | ICD-10-CM

## 2016-02-05 NOTE — Progress Notes (Signed)

## 2016-02-12 ENCOUNTER — Encounter: Payer: BC Managed Care – PPO | Attending: Family Medicine | Admitting: *Deleted

## 2016-02-12 DIAGNOSIS — E119 Type 2 diabetes mellitus without complications: Secondary | ICD-10-CM | POA: Insufficient documentation

## 2016-02-12 DIAGNOSIS — E669 Obesity, unspecified: Secondary | ICD-10-CM | POA: Insufficient documentation

## 2016-02-12 DIAGNOSIS — Z6831 Body mass index (BMI) 31.0-31.9, adult: Secondary | ICD-10-CM | POA: Diagnosis not present

## 2016-02-12 DIAGNOSIS — E1169 Type 2 diabetes mellitus with other specified complication: Secondary | ICD-10-CM

## 2016-02-12 DIAGNOSIS — Z713 Dietary counseling and surveillance: Secondary | ICD-10-CM | POA: Diagnosis not present

## 2016-02-12 NOTE — Progress Notes (Signed)

## 2016-02-19 ENCOUNTER — Encounter: Payer: BC Managed Care – PPO | Admitting: *Deleted

## 2016-02-19 DIAGNOSIS — E1169 Type 2 diabetes mellitus with other specified complication: Secondary | ICD-10-CM

## 2016-02-19 DIAGNOSIS — E669 Obesity, unspecified: Principal | ICD-10-CM

## 2016-02-19 DIAGNOSIS — Z713 Dietary counseling and surveillance: Secondary | ICD-10-CM | POA: Diagnosis not present

## 2016-02-19 NOTE — Progress Notes (Signed)
Patient was seen on 02/19/2016 for the third of a series of three diabetes self-management courses at [facility].   Bryan Smith the amount of activity recommended for healthy living . Describe activities suitable for individual needs . Identify ways to regularly incorporate activity into daily life . Identify barriers to activity and ways to over come these barriers  Identify diabetes medications being personally used and their primary action for lowering glucose and possible side effects . Describe role of stress on blood glucose and develop strategies to address psychosocial issues . Identify diabetes complications and ways to prevent them  Explain how to manage diabetes during illness . Evaluate success in meeting personal goal . Establish 2-3 goals that they will plan to diligently work on until they return for the  19-month follow-up visit  Goals:   I will count my carb choices at most meals and snacks  I will eat less unhealthy fats   Your patient has identified these potential barriers to change:  None stated  Your patient has identified their diabetes self-care support plan as  Family Support Plan:  Attend Support Group as desired

## 2016-03-26 ENCOUNTER — Encounter: Payer: Self-pay | Admitting: Family Medicine

## 2016-03-28 NOTE — Telephone Encounter (Signed)
Call in Levaquin 500 mg daily #10 with no rf. Use Mucinex 1200 mg bid as well

## 2016-03-31 ENCOUNTER — Other Ambulatory Visit: Payer: Self-pay | Admitting: Family Medicine

## 2016-03-31 MED ORDER — LEVOFLOXACIN 500 MG PO TABS: 500.0000 mg | ORAL_TABLET | Freq: Every day | ORAL | 0 refills | Status: DC

## 2016-03-31 NOTE — Telephone Encounter (Signed)
I sent script e-scribe to [facility], left a voice message with below advice and sent pt a my chart message.

## 2016-04-09 ENCOUNTER — Other Ambulatory Visit: Payer: Self-pay | Admitting: Family Medicine

## 2016-07-06 ENCOUNTER — Other Ambulatory Visit: Payer: Self-pay | Admitting: Family Medicine

## 2016-08-05 ENCOUNTER — Other Ambulatory Visit: Payer: Self-pay | Admitting: Family Medicine

## 2016-08-18 ENCOUNTER — Other Ambulatory Visit: Payer: Self-pay | Admitting: Family Medicine

## 2016-08-18 MED ORDER — METFORMIN HCL 500 MG PO TABS: 500.0000 mg | ORAL_TABLET | Freq: Two times a day (BID) | ORAL | 0 refills | Status: DC

## 2016-08-18 NOTE — Telephone Encounter (Signed)
I sent script e-scribe for Metformin to [facility].

## 2016-08-26 ENCOUNTER — Encounter: Payer: Self-pay | Admitting: Gastroenterology

## 2016-09-09 ENCOUNTER — Other Ambulatory Visit: Payer: Self-pay | Admitting: Family Medicine

## 2016-09-15 ENCOUNTER — Encounter: Payer: Self-pay | Admitting: Gastroenterology

## 2016-11-05 ENCOUNTER — Ambulatory Visit (AMBULATORY_SURGERY_CENTER): Payer: Self-pay

## 2016-11-05 VITALS — Ht 71.0 in | Wt 218.4 lb

## 2016-11-05 DIAGNOSIS — Z860100 Personal history of colon polyps, unspecified: Secondary | ICD-10-CM

## 2016-11-05 MED ORDER — SUPREP BOWEL PREP KIT 17.5-3.13-1.6 GM/177ML PO SOLN: 1.0000 | Freq: Once | ORAL | 0 refills | Status: AC

## 2016-11-05 NOTE — Progress Notes (Signed)
No allergies to eggs or soy No diet meds No home oxygen No past problems with anesthesia  Registered emmi 

## 2016-11-06 ENCOUNTER — Encounter: Payer: Self-pay | Admitting: Gastroenterology

## 2016-11-19 ENCOUNTER — Ambulatory Visit (AMBULATORY_SURGERY_CENTER): Payer: BC Managed Care – PPO | Admitting: Gastroenterology

## 2016-11-19 ENCOUNTER — Encounter: Payer: Self-pay | Admitting: Gastroenterology

## 2016-11-19 VITALS — BP 120/87 | HR 75 | Temp 97.5°F | Resp 12 | Ht 71.0 in | Wt 205.0 lb

## 2016-11-19 DIAGNOSIS — D123 Benign neoplasm of transverse colon: Secondary | ICD-10-CM

## 2016-11-19 DIAGNOSIS — D125 Benign neoplasm of sigmoid colon: Secondary | ICD-10-CM

## 2016-11-19 DIAGNOSIS — Z86010 Personal history of colonic polyps: Secondary | ICD-10-CM | POA: Diagnosis not present

## 2016-11-19 HISTORY — PX: COLONOSCOPY: SHX174

## 2016-11-19 MED ORDER — SODIUM CHLORIDE 0.9 % IV SOLN: 500.0000 mL | INTRAVENOUS | Status: DC

## 2016-11-19 NOTE — Op Note (Signed)
[place] Patient Name: Bryan Smith Procedure Date: 11/19/2016 8:26 AM MRN: [id number] Endoscopist: Ladene Artist , MD Age: 61 Referring MD:  Date of Birth: 1955-12-05 Gender: Male Account #: [id number] Procedure:                Colonoscopy Indications:              Surveillance: Personal history of adenomatous                            polyps on last colonoscopy 3 years ago Medicines:                Monitored Anesthesia Care Procedure:                Pre-Anesthesia Assessment:                           - Prior to the procedure, a History and Physical                            was performed, and patient medications and                            allergies were reviewed. The patient's tolerance of                            previous anesthesia was also reviewed. The risks                            and benefits of the procedure and the sedation                            options and risks were discussed with the patient.                            All questions were answered, and informed consent                            was obtained. Prior Anticoagulants: The patient has                            taken no previous anticoagulant or antiplatelet                            agents. ASA Grade Assessment: II - A patient with                            mild systemic disease. After reviewing the risks                            and benefits, the patient was deemed in                            satisfactory condition to undergo the procedure.  After obtaining informed consent, the colonoscope                            was passed under direct vision. Throughout the                            procedure, the patient's blood pressure, pulse, and                            oxygen saturations were monitored continuously. The                            Colonoscope was introduced through the anus and                            advanced to the the cecum,  identified by                            appendiceal orifice and ileocecal valve. The                            ileocecal valve, appendiceal orifice, and rectum                            were photographed. The quality of the bowel                            preparation was good. The colonoscopy was performed                            without difficulty. The patient tolerated the                            procedure well. Scope In: 8:39:09 AM Scope Out: 8:54:21 AM Scope Withdrawal Time: 0 hours 14 minutes 13 seconds  Total Procedure Duration: 0 hours 15 minutes 12 seconds  Findings:                 The perianal and digital rectal examinations were                            normal.                           Three sessile polyps were found in the sigmoid                            colon and transverse colon. The polyps were 5 to 8                            mm in size. These polyps were removed with a cold                            snare. Resection and retrieval were complete.  Multiple medium-mouthed diverticula were found in                            the left colon. There was no evidence of                            diverticular bleeding.                           Internal hemorrhoids were found during                            retroflexion. The hemorrhoids were small and Grade                            I (internal hemorrhoids that do not prolapse).                           The exam was otherwise without abnormality on                            direct and retroflexion views. Complications:            No immediate complications. Estimated blood loss:                            None. Estimated Blood Loss:     Estimated blood loss: none. Impression:               - Three 5 to 8 mm polyps in the sigmoid colon and                            in the transverse colon, removed with a cold snare.                            Resected and retrieved.                            - Mild diverticulosis in the left colon. There was                            no evidence of diverticular bleeding.                           - Internal hemorrhoids.                           - The examination was otherwise normal on direct                            and retroflexion views. Recommendation:           - Repeat colonoscopy in 5 years for surveillance.                           - Patient has a contact number available for  emergencies. The signs and symptoms of potential                            delayed complications were discussed with the                            patient. Return to normal activities tomorrow.                            Written discharge instructions were provided to the                            patient.                           - Resume previous diet.                           - Continue present medications.                           - Await pathology results. Ladene Artist, MD 11/19/2016 9:00:42 AM This report has been signed electronically.

## 2016-11-19 NOTE — Progress Notes (Signed)
Pt's states no medical or surgical changes since previsit or office visit. 

## 2016-11-19 NOTE — Progress Notes (Signed)
To PACU, VSS. Report to RN.tb 

## 2016-11-19 NOTE — Patient Instructions (Signed)
YOU HAD AN ENDOSCOPIC PROCEDURE TODAY AT THE [facility]:   Refer to the procedure report that was given to you for any specific questions about what was found during the examination.  If the procedure report does not answer your questions, please call your gastroenterologist to clarify.  If you requested that your care partner not be given the details of your procedure findings, then the procedure report has been included in a sealed envelope for you to review at your convenience later.  YOU SHOULD EXPECT: Some feelings of bloating in the abdomen. Passage of more gas than usual.  Walking can help get rid of the air that was put into your GI tract during the procedure and reduce the bloating. If you had a lower endoscopy (such as a colonoscopy or flexible sigmoidoscopy) you may notice spotting of blood in your stool or on the toilet paper. If you underwent a bowel prep for your procedure, you may not have a normal bowel movement for a few days.  Please Note:  You might notice some irritation and congestion in your nose or some drainage.  This is from the oxygen used during your procedure.  There is no need for concern and it should clear up in a day or so.  SYMPTOMS TO REPORT IMMEDIATELY:   Following lower endoscopy (colonoscopy or flexible sigmoidoscopy):  Excessive amounts of blood in the stool  Significant tenderness or worsening of abdominal pains  Swelling of the abdomen that is new, acute  Fever of 100F or higher    For urgent or emergent issues, a gastroenterologist can be reached at any hour by calling [phone].   DIET:  We do recommend a small meal at first, but then you may proceed to your regular diet.  Drink plenty of fluids but you should avoid alcoholic beverages for 24 hours.  ACTIVITY:  You should plan to take it easy for the rest of today and you should NOT DRIVE or use heavy machinery until tomorrow (because of the sedation medicines used during the test).     FOLLOW UP: Our staff will call the number listed on your records the next business day following your procedure to check on you and address any questions or concerns that you may have regarding the information given to you following your procedure. If we do not reach you, we will leave a message.  However, if you are feeling well and you are not experiencing any problems, there is no need to return our call.  We will assume that you have returned to your regular daily activities without incident.  If any biopsies were taken you will be contacted by phone or by letter within the next 1-3 weeks.  Please call us at [phone] if you have not heard about the biopsies in 3 weeks.    SIGNATURES/CONFIDENTIALITY: You and/or your care partner have signed paperwork which will be entered into your electronic medical record.  These signatures attest to the fact that that the information above on your After Visit Summary has been reviewed and is understood.  Full responsibility of the confidentiality of this discharge information lies with you and/or your care-partner.   Resume medications. Information given on polyps,diverticulosis,hemorrhoids and high fiber diet. 

## 2016-11-19 NOTE — Progress Notes (Signed)
Called to room to assist during endoscopic procedure.  Patient ID and intended procedure confirmed with present staff. Received instructions for my participation in the procedure from the performing physician.  

## 2016-11-20 ENCOUNTER — Telehealth: Payer: Self-pay | Admitting: *Deleted

## 2016-11-20 NOTE — Telephone Encounter (Signed)
  Follow up Call-  Call back number 11/19/2016  Post procedure Call Back phone  # [phone]  Permission to leave phone message Yes  Some recent data might be hidden     Patient questions:  Do you have a fever, pain , or abdominal swelling? No. Pain Score  0 *  Have you tolerated food without any problems? Yes.    Have you been able to return to your normal activities? Yes.    Do you have any questions about your discharge instructions: Diet   No. Medications  No. Follow up visit  No.  Do you have questions or concerns about your Care? No.  Actions: * If pain score is 4 or above: No action needed, pain <4.

## 2016-11-27 ENCOUNTER — Encounter: Payer: Self-pay | Admitting: Family Medicine

## 2016-11-27 ENCOUNTER — Encounter: Payer: Self-pay | Admitting: Gastroenterology

## 2016-12-08 ENCOUNTER — Other Ambulatory Visit: Payer: Self-pay | Admitting: Family Medicine

## 2016-12-10 ENCOUNTER — Ambulatory Visit (INDEPENDENT_AMBULATORY_CARE_PROVIDER_SITE_OTHER): Payer: BC Managed Care – PPO

## 2016-12-10 DIAGNOSIS — Z23 Encounter for immunization: Secondary | ICD-10-CM | POA: Diagnosis not present

## 2017-01-03 ENCOUNTER — Other Ambulatory Visit: Payer: Self-pay | Admitting: Family Medicine

## 2017-01-04 ENCOUNTER — Other Ambulatory Visit: Payer: Self-pay | Admitting: Family Medicine

## 2017-01-19 ENCOUNTER — Other Ambulatory Visit: Payer: Self-pay | Admitting: Family Medicine

## 2017-02-03 ENCOUNTER — Other Ambulatory Visit: Payer: Self-pay | Admitting: Family Medicine

## 2017-02-19 ENCOUNTER — Other Ambulatory Visit: Payer: Self-pay | Admitting: Family Medicine

## 2017-02-22 ENCOUNTER — Other Ambulatory Visit: Payer: Self-pay | Admitting: Family Medicine

## 2017-02-23 MED ORDER — METFORMIN HCL 500 MG PO TABS: 500.0000 mg | ORAL_TABLET | Freq: Two times a day (BID) | ORAL | 0 refills | Status: DC

## 2017-03-06 ENCOUNTER — Other Ambulatory Visit: Payer: Self-pay | Admitting: Family Medicine

## 2017-03-13 ENCOUNTER — Other Ambulatory Visit: Payer: Self-pay | Admitting: Family Medicine

## 2017-03-17 ENCOUNTER — Telehealth: Payer: Self-pay | Admitting: Family Medicine

## 2017-03-17 MED ORDER — METFORMIN HCL 500 MG PO TABS: 500.0000 mg | ORAL_TABLET | Freq: Two times a day (BID) | ORAL | 0 refills | Status: DC

## 2017-03-17 NOTE — Telephone Encounter (Signed)
Pt had 30 day refill 02/23/17 . Has upcoming appt 04/03/17. Routing back to office

## 2017-03-17 NOTE — Telephone Encounter (Signed)
Copied from [place] [id number]. Topic: Quick Communication - See Telephone Encounter >> Mar 17, 2017 12:32 PM Aurelio Brash B wrote: CRM for notification. See Telephone encounter for:  Refill   metFORMIN (GLUCOPHAGE) 500 MG tablet  [facility]

## 2017-03-17 NOTE — Telephone Encounter (Signed)
Rx sent 

## 2017-03-24 ENCOUNTER — Encounter: Payer: Self-pay | Admitting: Family Medicine

## 2017-04-03 ENCOUNTER — Other Ambulatory Visit: Payer: Self-pay | Admitting: Family Medicine

## 2017-04-03 ENCOUNTER — Encounter: Payer: Self-pay | Admitting: Family Medicine

## 2017-04-03 ENCOUNTER — Ambulatory Visit (INDEPENDENT_AMBULATORY_CARE_PROVIDER_SITE_OTHER): Payer: BC Managed Care – PPO | Admitting: Family Medicine

## 2017-04-03 VITALS — BP 122/80 | HR 92 | Temp 97.8°F | Ht 70.0 in | Wt 219.0 lb

## 2017-04-03 DIAGNOSIS — Z0000 Encounter for general adult medical examination without abnormal findings: Secondary | ICD-10-CM

## 2017-04-03 DIAGNOSIS — Z125 Encounter for screening for malignant neoplasm of prostate: Secondary | ICD-10-CM

## 2017-04-03 DIAGNOSIS — E1169 Type 2 diabetes mellitus with other specified complication: Secondary | ICD-10-CM

## 2017-04-03 DIAGNOSIS — E669 Obesity, unspecified: Secondary | ICD-10-CM | POA: Diagnosis not present

## 2017-04-03 LAB — CBC WITH DIFFERENTIAL/PLATELET
BASOS ABS: 0 10*3/uL (ref 0.0–0.1)
Basophils Relative: 1.3 % (ref 0.0–3.0)
Eosinophils Absolute: 0.1 10*3/uL (ref 0.0–0.7)
Eosinophils Relative: 2.3 % (ref 0.0–5.0)
HCT: 38.5 % — ABNORMAL LOW (ref 39.0–52.0)
Hemoglobin: 12.2 g/dL — ABNORMAL LOW (ref 13.0–17.0)
LYMPHS ABS: 0.8 10*3/uL (ref 0.7–4.0)
Lymphocytes Relative: 29.6 % (ref 12.0–46.0)
MCHC: 31.8 g/dL (ref 30.0–36.0)
MCV: 76.3 fl — AB (ref 78.0–100.0)
MONO ABS: 0.5 10*3/uL (ref 0.1–1.0)
Monocytes Relative: 18.3 % — ABNORMAL HIGH (ref 3.0–12.0)
NEUTROS ABS: 1.3 10*3/uL — AB (ref 1.4–7.7)
NEUTROS PCT: 48.5 % (ref 43.0–77.0)
PLATELETS: 276 10*3/uL (ref 150.0–400.0)
RBC: 5.05 Mil/uL (ref 4.22–5.81)
RDW: 17.1 % — ABNORMAL HIGH (ref 11.5–15.5)
WBC: 2.8 10*3/uL — ABNORMAL LOW (ref 4.0–10.5)

## 2017-04-03 LAB — LIPID PANEL
CHOLESTEROL: 159 mg/dL (ref 0–200)
HDL: 37.9 mg/dL — AB (ref >39.00)
LDL Cholesterol: 98 mg/dL (ref 0–99)
NonHDL: 121.25
TRIGLYCERIDES: 116 mg/dL (ref 0.0–149.0)
Total CHOL/HDL Ratio: 4
VLDL: 23.2 mg/dL (ref 0.0–40.0)

## 2017-04-03 LAB — HEPATIC FUNCTION PANEL
ALBUMIN: 3.5 g/dL (ref 3.5–5.2)
ALK PHOS: 57 U/L (ref 39–117)
ALT: 17 U/L (ref 0–53)
AST: 28 U/L (ref 0–37)
Bilirubin, Direct: 0.1 mg/dL (ref 0.0–0.3)
TOTAL PROTEIN: 5.8 g/dL — AB (ref 6.0–8.3)
Total Bilirubin: 0.4 mg/dL (ref 0.2–1.2)

## 2017-04-03 LAB — BASIC METABOLIC PANEL
BUN: 14 mg/dL (ref 6–23)
CHLORIDE: 104 meq/L (ref 96–112)
CO2: 29 meq/L (ref 19–32)
CREATININE: 1.25 mg/dL (ref 0.40–1.50)
Calcium: 8.6 mg/dL (ref 8.4–10.5)
GFR: 74.78 mL/min (ref >60.00)
GLUCOSE: 98 mg/dL (ref 70–99)
POTASSIUM: 4.1 meq/L (ref 3.5–5.1)
Sodium: 140 mEq/L (ref 135–145)

## 2017-04-03 LAB — HEMOGLOBIN A1C: HEMOGLOBIN A1C: 6.3 % (ref 4.6–6.5)

## 2017-04-03 LAB — PSA: PSA: 1.75 ng/mL (ref 0.10–4.00)

## 2017-04-03 LAB — TSH: TSH: 1.82 u[IU]/mL (ref 0.35–4.50)

## 2017-04-03 NOTE — Progress Notes (Signed)
   Subjective:    Patient ID: Bryan Smith, male    DOB: 1955/11/03, 62 y.o.   MRN: [id number]  HPI Here for a well exam. He feels well. He does mention loose stools. He had cramps and frank diarrhea when he started on Metformin, but this improved. Now there are no cramps but he has 2-3 loose stools a day. He rejoined a gym last week to exercise.    Review of Systems  Constitutional: Negative.   HENT: Negative.   Eyes: Negative.   Respiratory: Negative.   Cardiovascular: Negative.   Gastrointestinal: Negative.   Genitourinary: Negative.   Musculoskeletal: Negative.   Skin: Negative.   Neurological: Negative.   Psychiatric/Behavioral: Negative.        Objective:   Physical Exam  Constitutional: He is oriented to person, place, and time. He appears well-developed and well-nourished. No distress.  HENT:  Head: Normocephalic and atraumatic.  Right Ear: External ear normal.  Left Ear: External ear normal.  Nose: Nose normal.  Mouth/Throat: Oropharynx is clear and moist. No oropharyngeal exudate.  Eyes: Conjunctivae and EOM are normal. Pupils are equal, round, and reactive to light. Right eye exhibits no discharge. Left eye exhibits no discharge. No scleral icterus.  Neck: Neck supple. No JVD present. No tracheal deviation present. No thyromegaly present.  Cardiovascular: Normal rate, regular rhythm, normal heart sounds and intact distal pulses. Exam reveals no gallop and no friction rub.  No murmur heard. Pulmonary/Chest: Effort normal and breath sounds normal. No respiratory distress. He has no wheezes. He has no rales. He exhibits no tenderness.  Abdominal: Soft. Bowel sounds are normal. He exhibits no distension and no mass. There is no tenderness. There is no rebound and no guarding.  Genitourinary: Rectum normal, prostate normal and penis normal. Rectal exam shows guaiac negative stool. No penile tenderness.  Musculoskeletal: Normal range of motion. He exhibits no edema or  tenderness.  Lymphadenopathy:    He has no cervical adenopathy.  Neurological: He is alert and oriented to person, place, and time. He has normal reflexes. No cranial nerve deficit. He exhibits normal muscle tone. Coordination normal.  Skin: Skin is warm and dry. No rash noted. He is not diaphoretic. No erythema. No pallor.  Psychiatric: He has a normal mood and affect. His behavior is normal. Judgment and thought content normal.          Assessment & Plan:  Well exam. We discussed diet and exercise. Get fasting labs.  Alysia Penna, MD

## 2017-04-04 ENCOUNTER — Other Ambulatory Visit: Payer: Self-pay | Admitting: Family Medicine

## 2017-04-27 ENCOUNTER — Encounter: Payer: Self-pay | Admitting: Family Medicine

## 2017-05-01 ENCOUNTER — Telehealth: Payer: Self-pay

## 2017-05-01 MED ORDER — ESOMEPRAZOLE MAGNESIUM 40 MG PO CPDR: 40.0000 mg | DELAYED_RELEASE_CAPSULE | Freq: Every day | ORAL | 3 refills | Status: DC

## 2017-05-01 NOTE — Telephone Encounter (Signed)
Call in Nexium 40 mg daily #90 with 3 rf

## 2017-06-11 ENCOUNTER — Telehealth: Payer: Self-pay

## 2017-06-11 NOTE — Telephone Encounter (Signed)
Call in Metformin 500 mg bid, #180 with 3 rf

## 2017-06-11 NOTE — Telephone Encounter (Signed)
[facility] [address]   Pt is requesting RF for Metformin   Last OV 04/06/2017   LAST REFILLED 04/06/2017 DISP 30 WITH 5 REFILLS   Sent to PCP for approval.

## 2017-06-12 ENCOUNTER — Other Ambulatory Visit: Payer: Self-pay

## 2017-06-12 MED ORDER — METFORMIN HCL 500 MG PO TABS: 500.0000 mg | ORAL_TABLET | Freq: Two times a day (BID) | ORAL | 3 refills | Status: DC

## 2017-06-12 NOTE — Telephone Encounter (Signed)
Medication sent in. Left detailed message updating patient on refill.

## 2017-06-23 ENCOUNTER — Encounter: Payer: Self-pay | Admitting: Family Medicine

## 2017-06-24 NOTE — Telephone Encounter (Signed)
Rest, ice packs, and Ibuprofen or naproxen

## 2017-07-14 ENCOUNTER — Encounter: Payer: Self-pay | Admitting: Family Medicine

## 2017-07-17 NOTE — Telephone Encounter (Signed)
It should be better by now. Have him make an OV to see me

## 2017-07-20 ENCOUNTER — Encounter: Payer: Self-pay | Admitting: Family Medicine

## 2017-07-21 ENCOUNTER — Ambulatory Visit: Payer: BC Managed Care – PPO | Admitting: Family Medicine

## 2017-07-21 ENCOUNTER — Encounter: Payer: Self-pay | Admitting: Family Medicine

## 2017-07-21 VITALS — BP 140/88 | HR 88 | Temp 97.5°F | Wt 215.0 lb

## 2017-07-21 DIAGNOSIS — E669 Obesity, unspecified: Secondary | ICD-10-CM | POA: Diagnosis not present

## 2017-07-21 DIAGNOSIS — M7712 Lateral epicondylitis, left elbow: Secondary | ICD-10-CM | POA: Diagnosis not present

## 2017-07-21 DIAGNOSIS — E1169 Type 2 diabetes mellitus with other specified complication: Secondary | ICD-10-CM

## 2017-07-21 LAB — HEMOGLOBIN A1C: HEMOGLOBIN A1C: 6.1 % (ref 4.6–6.5)

## 2017-07-21 MED ORDER — DICLOFENAC SODIUM 75 MG PO TBEC: 75.0000 mg | DELAYED_RELEASE_TABLET | Freq: Two times a day (BID) | ORAL | 2 refills | Status: DC

## 2017-07-21 NOTE — Progress Notes (Signed)
   Subjective:    Patient ID: Bryan Smith, male    DOB: 1956-01-03, 62 y.o.   MRN: [id number]  HPI Here for 6 weeks of intermittent pain in the left elbow. No recent trauma but he joined a gym several months and and he has been working out a lot. He has tried ice and Naproxen and this has helped a bit.    Review of Systems  Constitutional: Negative.   Respiratory: Negative.   Cardiovascular: Negative.   Musculoskeletal: Positive for arthralgias.       Objective:   Physical Exam  Constitutional: He appears well-developed and well-nourished.  Cardiovascular: Normal rate, regular rhythm, normal heart sounds and intact distal pulses.  Pulmonary/Chest: Effort normal and breath sounds normal.  Musculoskeletal:  He is tender over the left lateral epicondyle. No swelling, full ROM           Assessment & Plan:  Lateral epicondylitis. Continue rest and ice. Try Diclofenac bid. Recheck prn.  Alysia Penna, MD

## 2017-09-28 ENCOUNTER — Ambulatory Visit: Payer: Self-pay

## 2017-09-28 ENCOUNTER — Encounter: Payer: Self-pay | Admitting: Family Medicine

## 2017-09-28 ENCOUNTER — Ambulatory Visit: Payer: BC Managed Care – PPO | Admitting: Family Medicine

## 2017-09-28 VITALS — BP 116/84 | HR 99 | Temp 97.8°F | Ht 70.0 in | Wt 211.4 lb

## 2017-09-28 DIAGNOSIS — R197 Diarrhea, unspecified: Secondary | ICD-10-CM

## 2017-09-28 NOTE — Patient Instructions (Addendum)
Plenty of fluids with electrolytes - soup broth, gatorade watered down, etc  No dairy for 1 week  Imodium per instructions  Seek emergency care if worsening, not continuing to improve, severe pain, fevers, bleeding, unable to tolerate fluids. Follow up if symptoms improve but persist.

## 2017-09-28 NOTE — Telephone Encounter (Signed)
Pt. Reports he started having abdominal pain this past Saturday.Started with diarrhea yesterday. Pain is a "7" on 1-10 scale. No blood in stool. Pt. Reports he has never "had pain like this before." Has "about 10 loose stools " the past 24 hours. Appointment made for today. Reason for Disposition . [1] MODERATE pain (e.g., interferes with normal activities) AND [2] pain comes and goes (cramps) AND [3] present > 24 hours  (Exception: pain with Vomiting or Diarrhea - see that Guideline)  Answer Assessment - Initial Assessment Questions 1. LOCATION: "Where does it hurt?"      Left lower quadrant 2. RADIATION: "Does the pain shoot anywhere else?" (e.g., chest, back)     Low back feels sore 3. ONSET: "When did the pain begin?" (Minutes, hours or days ago)      Saturday 4. SUDDEN: "Gradual or sudden onset?"      Gradual 5. PATTERN "Does the pain come and go, or is it constant?"    - If constant: "Is it getting better, staying the same, or worsening?"      (Note: Constant means the pain never goes away completely; most serious pain is constant and it progresses)     - If intermittent: "How long does it last?" "Do you have pain now?"     (Note: Intermittent means the pain goes away completely between bouts)     Eases 6. SEVERITY: "How bad is the pain?"  (e.g., Scale 1-10; mild, moderate, or severe)    - MILD (1-3): doesn't interfere with normal activities, abdomen soft and not tender to touch     - MODERATE (4-7): interferes with normal activities or awakens from sleep, tender to touch     - SEVERE (8-10): excruciating pain, doubled over, unable to do any normal activities       7 7. RECURRENT SYMPTOM: "Have you ever had this type of abdominal pain before?" If so, ask: "When was the last time?" and "What happened that time?"      No 8. CAUSE: "What do you think is causing the abdominal pain?"     Unsure 9. RELIEVING/AGGRAVATING FACTORS: "What makes it better or worse?" (e.g., movement, antacids,  bowel movement)     Bowel movement 10. OTHER SYMPTOMS: "Has there been any vomiting, diarrhea, constipation, or urine problems?"       Diarrhea  Protocols used: ABDOMINAL PAIN - MALE-A-AH

## 2017-09-28 NOTE — Progress Notes (Signed)
HPI:  Using dictation device. Unfortunately this device frequently misinterprets words/phrases.  Acute visit for diarrhea: -started acutely yesterday -symptoms include multiple episodes of watery diarrhea (4x overnight) with cramping prior to bowel movements -still with some abdominal discomfort at times (but not severe now) and several episodes loose bowels today -denies fevers, vomiting, hematochezia, melena, constant or focal abd pain, dysuria, inability to tol oral intake, recent travel or recent antibiotics -he is not drinking fluids today as he was afraid if would cause symptoms to return  ROS: See pertinent positives and negatives per HPI.  Past Medical History:  Diagnosis Date  . Allergy   . ANEMIA-IRON DEFICIENCY 04/26/2007  . Diabetes mellitus without complication ([place])   . ELEVATED BP READING WITHOUT DX HYPERTENSION 04/26/2007  . GERD 01/04/2007  . LEUKOCYTOPENIA UNSPECIFIED 01/24/2009    Past Surgical History:  Procedure Laterality Date  . COLONOSCOPY  11-01-13   per Dr. Fuller Plan, adenomatous polyps, repeat in 3 yrs     Family History  Problem Relation Age of Onset  . Arthritis Mother   . Colon cancer Mother     SOCIAL HX: see hpi   Current Outpatient Medications:  .  diclofenac (VOLTAREN) 75 MG EC tablet, Take 1 tablet (75 mg total) by mouth 2 (two) times daily., Disp: 60 tablet, Rfl: 2 .  esomeprazole (NEXIUM) 40 MG capsule, Take 1 capsule (40 mg total) by mouth daily., Disp: 90 capsule, Rfl: 3 .  losartan (COZAAR) 25 MG tablet, TAKE 1 TABLET BY MOUTH EVERY DAY, Disp: 90 tablet, Rfl: 2 .  metFORMIN (GLUCOPHAGE) 500 MG tablet, Take 1 tablet (500 mg total) by mouth 2 (two) times daily with a meal., Disp: 180 tablet, Rfl: 3 .  Multiple Vitamins-Minerals (CENTRUM SILVER PO), Take by mouth as needed., Disp: , Rfl:   Current Facility-Administered Medications:  .  0.9 %  sodium chloride infusion, 500 mL, Intravenous, Continuous, Ladene Artist,  MD  EXAM:  Vitals:   09/28/17 1001  BP: 116/84  Pulse: 99  Temp: 97.8 F (36.6 C)    Body mass index is 30.33 kg/m.  GENERAL: vitals reviewed and listed above, alert, oriented, appears well hydrated and in no acute distress  HEENT: atraumatic, conjunttiva clear, no obvious abnormalities on inspection of external nose and ears  NECK: no obvious masses on inspection  LUNGS: clear to auscultation bilaterally, no wheezes, rales or rhonchi, good air movement  CV: HRRR, no peripheral edema  MS: moves all extremities without noticeable abnormality  ABD: BS+, soft, mild difuse TTP, no rebound or guarding  PSYCH: pleasant and cooperative, no obvious depression or anxiety  ASSESSMENT AND PLAN:  Discussed the following assessment and plan:  Diarrhea of presumed infectious origin  -we discussed possible serious and likely etiologies, workup and treatment, treatment risks and return precautions, suspect viral vs other infectious diarrhea - no alarm symptoms and seems to be somewhat better currently -after this discussion, [place] opted for PO hydration, imodium, prompt ER eval if worsening vs follow up if persists but not severe -discussed if develops constant pain or worsening would need IV hydartion, CT, labs -of course, we advised [place]  to return or notify a doctor immediately if symptoms worsen or persist or new concerns arise.   Patient Instructions  Plenty of fluids with electrolytes - soup broth, gatorade watered down, etc  No dairy for 1 week  Imodium per instructions  Seek emergency care if worsening, not continuing to improve, severe pain, fevers, bleeding, unable to tolerate fluids.  Follow up if symptoms improve but persist.     [name], DO

## 2017-10-05 ENCOUNTER — Encounter: Payer: Self-pay | Admitting: Family Medicine

## 2017-10-06 NOTE — Telephone Encounter (Signed)
Tell him to stop the Metformin. Then after 2 months we will check another A1c

## 2017-12-24 ENCOUNTER — Ambulatory Visit (INDEPENDENT_AMBULATORY_CARE_PROVIDER_SITE_OTHER): Payer: BC Managed Care – PPO | Admitting: *Deleted

## 2017-12-24 DIAGNOSIS — Z23 Encounter for immunization: Secondary | ICD-10-CM | POA: Diagnosis not present

## 2017-12-31 ENCOUNTER — Encounter: Payer: Self-pay | Admitting: Family Medicine

## 2018-01-04 NOTE — Telephone Encounter (Signed)
Dr. Fry please advise. Thanks  

## 2018-01-06 NOTE — Telephone Encounter (Signed)
Since he is already on Nexium, he can add Pepto Bismol prn

## 2018-01-14 ENCOUNTER — Encounter: Payer: BC Managed Care – PPO | Admitting: Family Medicine

## 2018-01-20 ENCOUNTER — Other Ambulatory Visit: Payer: Self-pay | Admitting: Family Medicine

## 2018-01-22 ENCOUNTER — Telehealth: Payer: Self-pay | Admitting: Family Medicine

## 2018-01-22 NOTE — Telephone Encounter (Signed)
Copied from [place] [id number]. Topic: Quick Communication - Rx Refill/Question >> Jan 22, 2018  2:09 PM Blase Mess A wrote: Medication: losartan (COZAAR) 25 MG tablet [[id number]]  Has the patient contacted their pharmacy? Yes  (Agent: If no, request that the patient contact the pharmacy for the refill.) (Agent: If yes, when and what did the pharmacy advise?)  Preferred Pharmacy (with phone number or street name):   Agent: Please be advised that RX refills may take up to 3 business days. We ask that you follow-up with your pharmacy.

## 2018-01-25 MED ORDER — LOSARTAN POTASSIUM 25 MG PO TABS: 25.0000 mg | ORAL_TABLET | Freq: Every day | ORAL | 2 refills | Status: DC

## 2018-01-25 NOTE — Telephone Encounter (Signed)
Refill has been sent to the pts pharmacy per his request.

## 2018-03-09 ENCOUNTER — Encounter: Payer: Self-pay | Admitting: Family Medicine

## 2018-03-11 MED ORDER — BENZONATATE 200 MG PO CAPS: 200.0000 mg | ORAL_CAPSULE | Freq: Two times a day (BID) | ORAL | 2 refills | Status: DC | PRN

## 2018-03-11 NOTE — Telephone Encounter (Signed)
Dr. Sarajane Jews please advise on refill of meds.

## 2018-03-11 NOTE — Telephone Encounter (Signed)
Call in 200 mg to take bid prn cough, #60 with 2 rf

## 2018-04-06 ENCOUNTER — Ambulatory Visit (INDEPENDENT_AMBULATORY_CARE_PROVIDER_SITE_OTHER): Payer: BC Managed Care – PPO | Admitting: Family Medicine

## 2018-04-06 ENCOUNTER — Encounter: Payer: Self-pay | Admitting: Family Medicine

## 2018-04-06 VITALS — BP 138/96 | HR 91 | Temp 98.0°F | Ht 70.5 in | Wt 215.0 lb

## 2018-04-06 DIAGNOSIS — Z23 Encounter for immunization: Secondary | ICD-10-CM | POA: Diagnosis not present

## 2018-04-06 DIAGNOSIS — Z0000 Encounter for general adult medical examination without abnormal findings: Secondary | ICD-10-CM

## 2018-04-06 MED ORDER — PROMETHAZINE HCL 25 MG PO TABS: 25.0000 mg | ORAL_TABLET | Freq: Four times a day (QID) | ORAL | 2 refills | Status: DC | PRN

## 2018-04-06 NOTE — Progress Notes (Signed)
   Subjective:    Patient ID: Bryan Smith, male    DOB: 08/07/1955, 63 y.o.   MRN: [id number]  HPI Here for a well exam. He feels great he works out at the gym 3-4 days a week. His BP at home is stable.    Review of Systems  Constitutional: Negative.   HENT: Negative.   Eyes: Negative.   Respiratory: Negative.   Cardiovascular: Negative.   Gastrointestinal: Negative.   Genitourinary: Negative.   Musculoskeletal: Negative.   Skin: Negative.   Neurological: Negative.   Psychiatric/Behavioral: Negative.        Objective:   Physical Exam Constitutional:      General: He is not in acute distress.    Appearance: He is well-developed. He is not diaphoretic.  HENT:     Head: Normocephalic and atraumatic.     Right Ear: External ear normal.     Left Ear: External ear normal.     Nose: Nose normal.     Mouth/Throat:     Pharynx: No oropharyngeal exudate.  Eyes:     General: No scleral icterus.       Right eye: No discharge.        Left eye: No discharge.     Conjunctiva/sclera: Conjunctivae normal.     Pupils: Pupils are equal, round, and reactive to light.  Neck:     Musculoskeletal: Neck supple.     Thyroid: No thyromegaly.     Vascular: No JVD.     Trachea: No tracheal deviation.  Cardiovascular:     Rate and Rhythm: Normal rate and regular rhythm.     Heart sounds: Normal heart sounds. No murmur. No friction rub. No gallop.   Pulmonary:     Effort: Pulmonary effort is normal. No respiratory distress.     Breath sounds: Normal breath sounds. No wheezing or rales.  Chest:     Chest wall: No tenderness.  Abdominal:     General: Bowel sounds are normal. There is no distension.     Palpations: Abdomen is soft. There is no mass.     Tenderness: There is no abdominal tenderness. There is no guarding or rebound.  Genitourinary:    Penis: Normal. No tenderness.      Prostate: Normal.     Rectum: Normal. Guaiac result negative.  Musculoskeletal: Normal range of  motion.        General: No tenderness.  Lymphadenopathy:     Cervical: No cervical adenopathy.  Skin:    General: Skin is warm and dry.     Coloration: Skin is not pale.     Findings: No erythema or rash.  Neurological:     Mental Status: He is alert and oriented to person, place, and time.     Cranial Nerves: No cranial nerve deficit.     Motor: No abnormal muscle tone.     Coordination: Coordination normal.     Deep Tendon Reflexes: Reflexes are normal and symmetric. Reflexes normal.  Psychiatric:        Behavior: Behavior normal.        Thought Content: Thought content normal.        Judgment: Judgment normal.           Assessment & Plan:  Well exam. We discussed diet and exercise. Get fasting labs soon.  Alysia Penna, MD

## 2018-04-06 NOTE — Addendum Note (Signed)
Addended by: Elie Confer on: 04/06/2018 02:14 PM   Modules accepted: Orders

## 2018-04-07 LAB — CBC WITH DIFFERENTIAL/PLATELET
Basophils Absolute: 0 10*3/uL (ref 0.0–0.1)
Basophils Relative: 0.8 % (ref 0.0–3.0)
Eosinophils Absolute: 0.1 10*3/uL (ref 0.0–0.7)
Eosinophils Relative: 2.5 % (ref 0.0–5.0)
HCT: 41.4 % (ref 39.0–52.0)
Hemoglobin: 13.3 g/dL (ref 13.0–17.0)
Lymphocytes Relative: 26.3 % (ref 12.0–46.0)
Lymphs Abs: 1 10*3/uL (ref 0.7–4.0)
MCHC: 32 g/dL (ref 30.0–36.0)
MCV: 80.1 fl (ref 78.0–100.0)
Monocytes Absolute: 0.7 10*3/uL (ref 0.1–1.0)
Monocytes Relative: 18.4 % — ABNORMAL HIGH (ref 3.0–12.0)
Neutro Abs: 1.9 10*3/uL (ref 1.4–7.7)
Neutrophils Relative %: 52 % (ref 43.0–77.0)
Platelets: 247 10*3/uL (ref 150.0–400.0)
RBC: 5.17 Mil/uL (ref 4.22–5.81)
RDW: 16.3 % — AB (ref 11.5–15.5)
WBC: 3.6 10*3/uL — ABNORMAL LOW (ref 4.0–10.5)

## 2018-04-07 LAB — POC URINALSYSI DIPSTICK (AUTOMATED)
Bilirubin, UA: NEGATIVE
Blood, UA: NEGATIVE
Glucose, UA: NEGATIVE
Ketones, UA: NEGATIVE
Leukocytes, UA: NEGATIVE
Nitrite, UA: NEGATIVE
PROTEIN UA: NEGATIVE
Spec Grav, UA: >=1.03 — AB (ref 1.010–1.025)
Urobilinogen, UA: 0.2 E.U./dL
pH, UA: 5.5 (ref 5.0–8.0)

## 2018-04-07 LAB — LIPID PANEL
Cholesterol: 167 mg/dL (ref 0–200)
HDL: 36.6 mg/dL — ABNORMAL LOW (ref >39.00)
LDL Cholesterol: 108 mg/dL — ABNORMAL HIGH (ref 0–99)
NonHDL: 130.02
Total CHOL/HDL Ratio: 5
Triglycerides: 112 mg/dL (ref 0.0–149.0)
VLDL: 22.4 mg/dL (ref 0.0–40.0)

## 2018-04-07 LAB — BASIC METABOLIC PANEL
BUN: 14 mg/dL (ref 6–23)
CO2: 27 mEq/L (ref 19–32)
Calcium: 9 mg/dL (ref 8.4–10.5)
Chloride: 106 mEq/L (ref 96–112)
Creatinine, Ser: 1.27 mg/dL (ref 0.40–1.50)
GFR: 68.86 mL/min (ref >60.00)
Glucose, Bld: 106 mg/dL — ABNORMAL HIGH (ref 70–99)
POTASSIUM: 4.1 meq/L (ref 3.5–5.1)
Sodium: 140 mEq/L (ref 135–145)

## 2018-04-07 LAB — HEPATIC FUNCTION PANEL
ALT: 13 U/L (ref 0–53)
AST: 13 U/L (ref 0–37)
Albumin: 3.5 g/dL (ref 3.5–5.2)
Alkaline Phosphatase: 63 U/L (ref 39–117)
BILIRUBIN DIRECT: 0.1 mg/dL (ref 0.0–0.3)
Total Bilirubin: 0.3 mg/dL (ref 0.2–1.2)
Total Protein: 5.5 g/dL — ABNORMAL LOW (ref 6.0–8.3)

## 2018-04-07 LAB — PSA: PSA: 1.58 ng/mL (ref 0.10–4.00)

## 2018-04-07 LAB — TSH: TSH: 2.83 u[IU]/mL (ref 0.35–4.50)

## 2018-04-07 NOTE — Addendum Note (Signed)
Addended by: Elmer Picker on: 04/07/2018 08:36 AM   Modules accepted: Orders

## 2018-04-09 ENCOUNTER — Encounter: Payer: Self-pay | Admitting: *Deleted

## 2018-09-22 ENCOUNTER — Other Ambulatory Visit: Payer: Self-pay | Admitting: Family Medicine

## 2018-09-24 ENCOUNTER — Encounter: Payer: Self-pay | Admitting: Family Medicine

## 2018-09-27 MED ORDER — LOSARTAN POTASSIUM 25 MG PO TABS: 25.0000 mg | ORAL_TABLET | Freq: Every day | ORAL | 2 refills | Status: DC

## 2018-12-15 ENCOUNTER — Encounter: Payer: Self-pay | Admitting: Family Medicine

## 2018-12-21 ENCOUNTER — Other Ambulatory Visit: Payer: Self-pay

## 2018-12-21 ENCOUNTER — Ambulatory Visit (INDEPENDENT_AMBULATORY_CARE_PROVIDER_SITE_OTHER): Payer: Medicare Other

## 2018-12-21 DIAGNOSIS — Z23 Encounter for immunization: Secondary | ICD-10-CM | POA: Diagnosis not present

## 2018-12-25 ENCOUNTER — Other Ambulatory Visit: Payer: Self-pay | Admitting: Family Medicine

## 2019-01-04 ENCOUNTER — Encounter: Payer: Self-pay | Admitting: Family Medicine

## 2019-01-08 ENCOUNTER — Encounter: Payer: Self-pay | Admitting: Family Medicine

## 2019-01-11 ENCOUNTER — Ambulatory Visit: Payer: BC Managed Care – PPO

## 2019-01-11 MED ORDER — ZOSTER VAC RECOMB ADJUVANTED 50 MCG/0.5ML IM SUSR: 0.5000 mL | Freq: Once | INTRAMUSCULAR | 0 refills | Status: AC

## 2019-01-15 ENCOUNTER — Encounter: Payer: Self-pay | Admitting: Family Medicine

## 2019-01-20 NOTE — Telephone Encounter (Signed)
Who do I need to send this too?

## 2019-04-03 ENCOUNTER — Encounter: Payer: Self-pay | Admitting: Family Medicine

## 2019-04-04 NOTE — Telephone Encounter (Signed)
I advise him to cancel the shingles shot for now. Get the Covid vaccine as scheduled and then wait about 2 months before getting th second shingles shot

## 2019-04-18 ENCOUNTER — Ambulatory Visit: Payer: Medicare PPO

## 2019-05-09 ENCOUNTER — Encounter: Payer: Self-pay | Admitting: Family Medicine

## 2019-05-10 NOTE — Telephone Encounter (Signed)
Have him come in for an OV

## 2019-05-11 NOTE — Telephone Encounter (Signed)
No need for an OV unless it comes back

## 2019-06-10 ENCOUNTER — Other Ambulatory Visit: Payer: Self-pay

## 2019-06-10 ENCOUNTER — Emergency Department (HOSPITAL_COMMUNITY)
Admission: EM | Admit: 2019-06-10 | Discharge: 2019-06-10 | Disposition: A | Discharge status: Home / Self Care | Payer: Medicare PPO | Attending: Emergency Medicine | Admitting: Emergency Medicine

## 2019-06-10 DIAGNOSIS — Z79899 Other long term (current) drug therapy: Secondary | ICD-10-CM | POA: Diagnosis not present

## 2019-06-10 DIAGNOSIS — I10 Essential (primary) hypertension: Secondary | ICD-10-CM | POA: Diagnosis not present

## 2019-06-10 DIAGNOSIS — R310 Gross hematuria: Secondary | ICD-10-CM

## 2019-06-10 DIAGNOSIS — E119 Type 2 diabetes mellitus without complications: Secondary | ICD-10-CM | POA: Insufficient documentation

## 2019-06-10 DIAGNOSIS — R338 Other retention of urine: Secondary | ICD-10-CM

## 2019-06-10 DIAGNOSIS — N390 Urinary tract infection, site not specified: Secondary | ICD-10-CM | POA: Insufficient documentation

## 2019-06-10 DIAGNOSIS — R319 Hematuria, unspecified: Secondary | ICD-10-CM | POA: Diagnosis present

## 2019-06-10 LAB — URINALYSIS, ROUTINE W REFLEX MICROSCOPIC
Bilirubin Urine: NEGATIVE
Glucose, UA: NEGATIVE mg/dL
Ketones, ur: NEGATIVE mg/dL
Leukocytes,Ua: NEGATIVE
Nitrite: NEGATIVE
Protein, ur: 100 mg/dL — AB
RBC / HPF: >50 RBC/hpf — ABNORMAL HIGH (ref 0–5)
Specific Gravity, Urine: 1.011 (ref 1.005–1.030)
pH: 7 (ref 5.0–8.0)

## 2019-06-10 LAB — CBC WITH DIFFERENTIAL/PLATELET
Abs Immature Granulocytes: 0.01 10*3/uL (ref 0.00–0.07)
Basophils Absolute: 0 10*3/uL (ref 0.0–0.1)
Basophils Relative: 1 %
Eosinophils Absolute: 0 10*3/uL (ref 0.0–0.5)
Eosinophils Relative: 1 %
HCT: 42.5 % (ref 39.0–52.0)
Hemoglobin: 12.6 g/dL — ABNORMAL LOW (ref 13.0–17.0)
Immature Granulocytes: 0 %
Lymphocytes Relative: 17 %
Lymphs Abs: 0.6 10*3/uL — ABNORMAL LOW (ref 0.7–4.0)
MCH: 22.8 pg — ABNORMAL LOW (ref 26.0–34.0)
MCHC: 29.6 g/dL — ABNORMAL LOW (ref 30.0–36.0)
MCV: 77 fL — ABNORMAL LOW (ref 80.0–100.0)
Monocytes Absolute: 0.5 10*3/uL (ref 0.1–1.0)
Monocytes Relative: 15 %
Neutro Abs: 2.2 10*3/uL (ref 1.7–7.7)
Neutrophils Relative %: 66 %
Platelets: 286 10*3/uL (ref 150–400)
RBC: 5.52 MIL/uL (ref 4.22–5.81)
RDW: 18.2 % — ABNORMAL HIGH (ref 11.5–15.5)
WBC: 3.3 10*3/uL — ABNORMAL LOW (ref 4.0–10.5)
nRBC: 0 % (ref 0.0–0.2)

## 2019-06-10 LAB — BASIC METABOLIC PANEL
Anion gap: 8 (ref 5–15)
BUN: 13 mg/dL (ref 8–23)
CO2: 25 mmol/L (ref 22–32)
Calcium: 8.8 mg/dL — ABNORMAL LOW (ref 8.9–10.3)
Chloride: 107 mmol/L (ref 98–111)
Creatinine, Ser: 1.26 mg/dL — ABNORMAL HIGH (ref 0.61–1.24)
GFR calc Af Amer: >60 mL/min (ref >60)
GFR calc non Af Amer: 59 mL/min — ABNORMAL LOW (ref >60)
Glucose, Bld: 128 mg/dL — ABNORMAL HIGH (ref 70–99)
Potassium: 4.6 mmol/L (ref 3.5–5.1)
Sodium: 140 mmol/L (ref 135–145)

## 2019-06-10 MED ORDER — SODIUM CHLORIDE 0.9 % IV BOLUS
1000.0000 mL | Freq: Once | INTRAVENOUS | Status: AC
  Administered 2019-06-10: 1000 mL via INTRAVENOUS

## 2019-06-10 MED ORDER — CEPHALEXIN 500 MG PO CAPS
500.0000 mg | ORAL_CAPSULE | Freq: Once | ORAL | Status: AC
  Administered 2019-06-10: 500 mg via ORAL
  Filled 2019-06-10: qty 1

## 2019-06-10 MED ORDER — CEPHALEXIN 500 MG PO CAPS: 500.0000 mg | ORAL_CAPSULE | Freq: Two times a day (BID) | ORAL | 0 refills | Status: AC

## 2019-06-10 NOTE — ED Triage Notes (Signed)
Patient reports he found blood in urine this morning. First occurrence. Denies pain but says bladder feels full.

## 2019-06-10 NOTE — ED Notes (Signed)
BLADDER SCAN RESULTED [facility]

## 2019-06-10 NOTE — ED Provider Notes (Signed)
[facility] DEPT Provider Note   CSN: [id number] Arrival date & time: 06/10/19  [id number]     History Chief Complaint  Patient presents with  . Hematuria    Bryan Smith is a 64 y.o. male.  HPI 64 year old male presents with hematuria.  Started this morning.  At one point he noticed some blood clots came out.  No dysuria though he is not peeing much out.  Does not feel like he needs to go to the bathroom right now but a bladder scan showed almost 400 mL of urine in his bladder.  No back or flank pain.  No abdominal pain or genital pain.  No known history of prostate problems.   Past Medical History:  Diagnosis Date  . Allergy   . ANEMIA-IRON DEFICIENCY 04/26/2007  . Diabetes mellitus without complication ([place])   . ELEVATED BP READING WITHOUT DX HYPERTENSION 04/26/2007  . GERD 01/04/2007  . LEUKOCYTOPENIA UNSPECIFIED 01/24/2009    Patient Active Problem List   Diagnosis Date Noted  . Diabetes mellitus type 2 in obese ([place]) 01/17/2016  . Chronic neck pain 01/16/2015  . Palpitations 01/16/2015  . Anxiety state 01/16/2015  . LEUKOCYTOPENIA UNSPECIFIED 01/24/2009  . GASTROENTERITIS 12/26/2008  . SKIN TAG 11/29/2007  . ACUTE BRONCHITIS 07/20/2007  . NECK PAIN 06/25/2007  . ANEMIA-IRON DEFICIENCY 04/26/2007  . HTN (hypertension) 04/26/2007  . GERD 01/04/2007    Past Surgical History:  Procedure Laterality Date  . COLONOSCOPY  11/19/2016   per Dr. Fuller Plan, adenomatous polyps, repeat in 5 yrs        Family History  Problem Relation Age of Onset  . Arthritis Mother   . Colon cancer Mother     Social History   Tobacco Use  . Smoking status: Never Smoker  . Smokeless tobacco: Never Used  Substance Use Topics  . Alcohol use: No    Alcohol/week: 0.0 standard drinks  . Drug use: No    Home Medications Prior to Admission medications   Medication Sig Start Date End Date Taking? Authorizing Provider  esomeprazole (NEXIUM) 40 MG capsule  Take 1 capsule (40 mg total) by mouth daily. Patient taking differently: Take 40 mg by mouth daily as needed (heart burn).  05/01/17  Yes Laurey Morale, MD  losartan (COZAAR) 50 MG tablet Take 50 mg by mouth daily. 03/22/19  Yes [provider]  Multiple Vitamins-Minerals (CENTRUM SILVER PO) Take by mouth as needed.   Yes [provider]  benzonatate (TESSALON) 200 MG capsule Take 1 capsule (200 mg total) by mouth 2 (two) times daily as needed for cough. Patient not taking: Reported on 04/06/2018 03/11/18   Laurey Morale, MD  cephALEXin (KEFLEX) 500 MG capsule Take 1 capsule (500 mg total) by mouth 2 (two) times daily for 7 days. 06/10/19 06/17/19  Sherwood Gambler, MD  diclofenac (VOLTAREN) 75 MG EC tablet Take 1 tablet (75 mg total) by mouth 2 (two) times daily. Patient not taking: Reported on 04/06/2018 07/21/17   Laurey Morale, MD  promethazine (PHENERGAN) 25 MG tablet Take 1 tablet (25 mg total) by mouth every 6 (six) hours as needed for nausea or vomiting. Patient not taking: Reported on 06/10/2019 04/06/18   Laurey Morale, MD    Allergies    Lisinopril  Review of Systems   Review of Systems  Constitutional: Negative for fever.  Gastrointestinal: Negative for abdominal pain.  Genitourinary: Positive for difficulty urinating and hematuria. Negative for dysuria, flank pain and penile  pain.  Musculoskeletal: Negative for back pain.  All other systems reviewed and are negative.   Physical Exam Updated Vital Signs BP (!) 169/117   Pulse 95   Temp 98.2 F (36.8 C) (Oral)   Resp 18   Ht 5\' 11"  (1.803 m)   Wt 99.3 kg   SpO2 99%   BMI 30.54 kg/m   Physical Exam Vitals and nursing note reviewed.  Constitutional:      Appearance: He is well-developed. He is obese.  HENT:     Head: Normocephalic and atraumatic.     Right Ear: External ear normal.     Left Ear: External ear normal.     Nose: Nose normal.  Eyes:     General:        Right eye: No discharge.        Left  eye: No discharge.  Cardiovascular:     Rate and Rhythm: Normal rate and regular rhythm.     Heart sounds: Normal heart sounds.  Pulmonary:     Effort: Pulmonary effort is normal.     Breath sounds: Normal breath sounds.  Abdominal:     Palpations: Abdomen is soft.     Tenderness: There is no abdominal tenderness. There is no right CVA tenderness or left CVA tenderness.  Genitourinary:    Penis: Circumcised. No tenderness.      Testes:        Right: Tenderness not present.        Left: Tenderness not present.  Musculoskeletal:     Cervical back: Neck supple.  Skin:    General: Skin is warm and dry.  Neurological:     Mental Status: He is alert.  Psychiatric:        Mood and Affect: Mood is not anxious.     ED Results / Procedures / Treatments   Labs (all labs ordered are listed, but only abnormal results are displayed) Labs Reviewed  BASIC METABOLIC PANEL - Abnormal; Notable for the following components:      Result Value   Glucose, Bld 128 (*)    Creatinine, Ser 1.26 (*)    Calcium 8.8 (*)    GFR calc non Af Amer 59 (*)    All other components within normal limits  CBC WITH DIFFERENTIAL/PLATELET - Abnormal; Notable for the following components:   WBC 3.3 (*)    Hemoglobin 12.6 (*)    MCV 77.0 (*)    MCH 22.8 (*)    MCHC 29.6 (*)    RDW 18.2 (*)    Lymphs Abs 0.6 (*)    All other components within normal limits  URINALYSIS, ROUTINE W REFLEX MICROSCOPIC - Abnormal; Notable for the following components:   APPearance CLOUDY (*)    Hgb urine dipstick LARGE (*)    Protein, ur 100 (*)    RBC / HPF >50 (*)    Bacteria, UA FEW (*)    All other components within normal limits  URINE CULTURE    EKG None  Radiology No results found.  Procedures Procedures (including critical care time)  Medications Ordered in ED Medications  cephALEXin (KEFLEX) capsule 500 mg (has no administration in time range)  sodium chloride 0.9 % bolus 1,000 mL (1,000 mLs Intravenous New  Bag/Given 06/10/19 1022)    ED Course  I have reviewed the triage vital signs and the nursing notes.  Pertinent labs & imaging results that were available during my care of the patient were reviewed by me and  considered in my medical decision making (see chart for details).    MDM Rules/Calculators/A&P                      Patient had over 1 L of bloody urine come out after Foley catheter placement.  Initial work-up is unrevealing including stable renal function, hemoglobin, etc.  UA shows large amount of blood with some WBCs and few bacteria.  Given unclear cause of his hematuria I think putting on antibiotics is reasonable and will send for culture.  He has no flank pain to suggest ureteral or kidney stone.  Otherwise will need work-up by urology.  This did not sound like a difficult to pass Foley catheter and he has no typical prostate hypertrophy symptoms I do not think Flomax is really indicated at this time. Final Clinical Impression(s) / ED Diagnoses Final diagnoses:  Gross hematuria  Acute urinary retention    Rx / DC Orders ED Discharge Orders         Ordered    cephALEXin (KEFLEX) 500 MG capsule  2 times daily     06/10/19 1153           Sherwood Gambler, MD 06/10/19 1155

## 2019-06-11 LAB — URINE CULTURE: Culture: NO GROWTH

## 2019-06-20 ENCOUNTER — Encounter: Payer: Medicare PPO | Admitting: Family Medicine

## 2019-06-24 ENCOUNTER — Other Ambulatory Visit: Payer: Self-pay

## 2019-06-27 ENCOUNTER — Encounter: Payer: Self-pay | Admitting: Family Medicine

## 2019-06-27 ENCOUNTER — Other Ambulatory Visit: Payer: Self-pay

## 2019-06-27 ENCOUNTER — Ambulatory Visit (INDEPENDENT_AMBULATORY_CARE_PROVIDER_SITE_OTHER): Payer: Medicare PPO | Admitting: Family Medicine

## 2019-06-27 VITALS — BP 130/72 | HR 98 | Temp 97.8°F | Wt 225.2 lb

## 2019-06-27 DIAGNOSIS — Z0000 Encounter for general adult medical examination without abnormal findings: Secondary | ICD-10-CM | POA: Diagnosis not present

## 2019-06-27 DIAGNOSIS — E669 Obesity, unspecified: Secondary | ICD-10-CM | POA: Diagnosis not present

## 2019-06-27 DIAGNOSIS — R972 Elevated prostate specific antigen [PSA]: Secondary | ICD-10-CM | POA: Diagnosis not present

## 2019-06-27 DIAGNOSIS — E1169 Type 2 diabetes mellitus with other specified complication: Secondary | ICD-10-CM

## 2019-06-27 LAB — URINALYSIS, ROUTINE W REFLEX MICROSCOPIC
Bilirubin Urine: NEGATIVE
Ketones, ur: NEGATIVE
Nitrite: NEGATIVE
Specific Gravity, Urine: 1.015 (ref 1.000–1.030)
Total Protein, Urine: NEGATIVE
Urine Glucose: NEGATIVE
Urobilinogen, UA: 0.2 (ref 0.0–1.0)
pH: 6.5 (ref 5.0–8.0)

## 2019-06-27 LAB — LIPID PANEL
Cholesterol: 154 mg/dL (ref 0–200)
HDL: 36.2 mg/dL — ABNORMAL LOW (ref >39.00)
LDL Cholesterol: 93 mg/dL (ref 0–99)
NonHDL: 118.05
Total CHOL/HDL Ratio: 4
Triglycerides: 126 mg/dL (ref 0.0–149.0)
VLDL: 25.2 mg/dL (ref 0.0–40.0)

## 2019-06-27 LAB — CBC WITH DIFFERENTIAL/PLATELET
Basophils Absolute: 0.1 10*3/uL (ref 0.0–0.1)
Basophils Relative: 1.4 % (ref 0.0–3.0)
Eosinophils Absolute: 0.1 10*3/uL (ref 0.0–0.7)
Eosinophils Relative: 2.5 % (ref 0.0–5.0)
HCT: 38.7 % — ABNORMAL LOW (ref 39.0–52.0)
Hemoglobin: 12.2 g/dL — ABNORMAL LOW (ref 13.0–17.0)
Lymphocytes Relative: 32.1 % (ref 12.0–46.0)
Lymphs Abs: 1.2 10*3/uL (ref 0.7–4.0)
MCHC: 31.6 g/dL (ref 30.0–36.0)
MCV: 74 fl — ABNORMAL LOW (ref 78.0–100.0)
Monocytes Absolute: 0.6 10*3/uL (ref 0.1–1.0)
Monocytes Relative: 14.6 % — ABNORMAL HIGH (ref 3.0–12.0)
Neutro Abs: 1.9 10*3/uL (ref 1.4–7.7)
Neutrophils Relative %: 49.4 % (ref 43.0–77.0)
Platelets: 353 10*3/uL (ref 150.0–400.0)
RBC: 5.23 Mil/uL (ref 4.22–5.81)
RDW: 18.6 % — ABNORMAL HIGH (ref 11.5–15.5)
WBC: 3.8 10*3/uL — ABNORMAL LOW (ref 4.0–10.5)

## 2019-06-27 LAB — BASIC METABOLIC PANEL
BUN: 11 mg/dL (ref 6–23)
CO2: 30 mEq/L (ref 19–32)
Calcium: 8.9 mg/dL (ref 8.4–10.5)
Chloride: 104 mEq/L (ref 96–112)
Creatinine, Ser: 1.23 mg/dL (ref 0.40–1.50)
GFR: 71.18 mL/min (ref >60.00)
Glucose, Bld: 105 mg/dL — ABNORMAL HIGH (ref 70–99)
Potassium: 4.6 mEq/L (ref 3.5–5.1)
Sodium: 140 mEq/L (ref 135–145)

## 2019-06-27 LAB — HEPATIC FUNCTION PANEL
ALT: 21 U/L (ref 0–53)
AST: 19 U/L (ref 0–37)
Albumin: 3.7 g/dL (ref 3.5–5.2)
Alkaline Phosphatase: 71 U/L (ref 39–117)
Bilirubin, Direct: 0.1 mg/dL (ref 0.0–0.3)
Total Bilirubin: 0.3 mg/dL (ref 0.2–1.2)
Total Protein: 6.4 g/dL (ref 6.0–8.3)

## 2019-06-27 LAB — PSA: PSA: 6.14 ng/mL — ABNORMAL HIGH (ref 0.10–4.00)

## 2019-06-27 LAB — HEMOGLOBIN A1C: Hgb A1c MFr Bld: 6.6 % — ABNORMAL HIGH (ref 4.6–6.5)

## 2019-06-27 LAB — TSH: TSH: 2.64 u[IU]/mL (ref 0.35–4.50)

## 2019-06-27 MED ORDER — TAMSULOSIN HCL 0.4 MG PO CAPS: 0.4000 mg | ORAL_CAPSULE | Freq: Every day | ORAL | 3 refills | Status: DC

## 2019-06-27 MED ORDER — LOSARTAN POTASSIUM 50 MG PO TABS: 50.0000 mg | ORAL_TABLET | Freq: Every day | ORAL | 3 refills | Status: DC

## 2019-06-27 NOTE — Progress Notes (Signed)
Subjective:    Patient ID: Bryan Smith, male    DOB: 02/20/1956, 64 y.o.   MRN: [id number]  HPI Here for a well exam and also to review an ER visit on 06-10-19 for hematuria. He had no other symptoms at that time. His workup was negative except for the blood. He was put on Kelfex but a culture revealed no infection. A catheter was placed because he had retained 400 cc of urine after voiding. He saw Dr. Aleen Campi at Urology last week, and he removed the catheter. He started him on Flomax. He did not say anything about doing a cystoscopy. Suezanne Jacquet will see him back in a few weeks.    Review of Systems  Constitutional: Negative.   HENT: Negative.   Eyes: Negative.   Respiratory: Negative.   Cardiovascular: Negative.   Gastrointestinal: Negative.   Genitourinary: Positive for hematuria.  Musculoskeletal: Negative.   Skin: Negative.   Neurological: Negative.   Psychiatric/Behavioral: Negative.        Objective:   Physical Exam Constitutional:      General: He is not in acute distress.    Appearance: He is well-developed. He is not diaphoretic.  HENT:     Head: Normocephalic and atraumatic.     Right Ear: External ear normal.     Left Ear: External ear normal.     Nose: Nose normal.     Mouth/Throat:     Pharynx: No oropharyngeal exudate.  Eyes:     General: No scleral icterus.       Right eye: No discharge.        Left eye: No discharge.     Conjunctiva/sclera: Conjunctivae normal.     Pupils: Pupils are equal, round, and reactive to light.  Neck:     Thyroid: No thyromegaly.     Vascular: No JVD.     Trachea: No tracheal deviation.  Cardiovascular:     Rate and Rhythm: Normal rate and regular rhythm.     Heart sounds: Normal heart sounds. No murmur. No friction rub. No gallop.   Pulmonary:     Effort: Pulmonary effort is normal. No respiratory distress.     Breath sounds: Normal breath sounds. No wheezing or rales.  Chest:     Chest wall: No tenderness.   Abdominal:     General: Bowel sounds are normal. There is no distension.     Palpations: Abdomen is soft. There is no mass.     Tenderness: There is no abdominal tenderness. There is no guarding or rebound.  Genitourinary:    Penis: Normal. No tenderness.      Testes: Normal.     Prostate: Normal.     Rectum: Normal. Guaiac result negative.  Musculoskeletal:        General: No tenderness. Normal range of motion.     Cervical back: Neck supple.  Lymphadenopathy:     Cervical: No cervical adenopathy.  Skin:    General: Skin is warm and dry.     Coloration: Skin is not pale.     Findings: No erythema or rash.  Neurological:     Mental Status: He is alert and oriented to person, place, and time.     Cranial Nerves: No cranial nerve deficit.     Motor: No abnormal muscle tone.     Coordination: Coordination normal.     Deep Tendon Reflexes: Reflexes are normal and symmetric. Reflexes normal.  Psychiatric:  Behavior: Behavior normal.        Thought Content: Thought content normal.        Judgment: Judgment normal.           Assessment & Plan:  Well exam. We discussed diet and exercise. Get fasting labs. He will follow up with Urology.  Alysia Penna, MD

## 2019-06-28 NOTE — Telephone Encounter (Signed)
He can go ahead and get the Shingrix booster any time now

## 2019-06-28 NOTE — Addendum Note (Signed)
Addended by: Alysia Penna A on: 06/28/2019 07:59 AM   Modules accepted: Orders

## 2019-06-30 ENCOUNTER — Encounter: Payer: Self-pay | Admitting: Family Medicine

## 2019-07-01 ENCOUNTER — Encounter: Payer: Self-pay | Admitting: Family Medicine

## 2019-07-01 NOTE — Telephone Encounter (Signed)
Please advise. Also written for 50mg  in January. All other scripts before that were 25 mg.

## 2019-07-04 NOTE — Telephone Encounter (Signed)
Yes this was increased to 50 mg daily earlier this year

## 2019-07-08 ENCOUNTER — Encounter: Payer: Self-pay | Admitting: Family Medicine

## 2019-07-12 ENCOUNTER — Encounter: Payer: Self-pay | Admitting: Family Medicine

## 2019-07-15 MED ORDER — LOSARTAN POTASSIUM 50 MG PO TABS: 50.0000 mg | ORAL_TABLET | Freq: Every day | ORAL | 3 refills | Status: DC

## 2019-10-20 ENCOUNTER — Other Ambulatory Visit: Payer: Self-pay | Admitting: Family Medicine

## 2019-10-20 NOTE — Telephone Encounter (Signed)
Please advise, 50mg  is on the patients med list.

## 2019-11-13 ENCOUNTER — Encounter: Payer: Self-pay | Admitting: Family Medicine

## 2019-11-15 NOTE — Telephone Encounter (Signed)
He will need an OV

## 2019-11-16 ENCOUNTER — Telehealth (INDEPENDENT_AMBULATORY_CARE_PROVIDER_SITE_OTHER): Payer: Medicare PPO | Admitting: Family Medicine

## 2019-11-16 ENCOUNTER — Encounter: Payer: Self-pay | Admitting: Family Medicine

## 2019-11-16 DIAGNOSIS — J069 Acute upper respiratory infection, unspecified: Secondary | ICD-10-CM | POA: Diagnosis not present

## 2019-11-16 NOTE — Progress Notes (Signed)
Subjective:    Patient ID: Bryan Smith, male    DOB: 1956/01/24, 64 y.o.   MRN: [id number]  HPI Virtual Visit via Video Note  I connected with the patient on 11/16/19 at  2:00 PM EDT by a video enabled telemedicine application and verified that I am speaking with the correct person using two identifiers.  Location patient: home Location provider:work or home office Persons participating in the virtual visit: patient, provider  I discussed the limitations of evaluation and management by telemedicine and the availability of in person appointments. The patient expressed understanding and agreed to proceed.   HPI: Here asking about some URI symptoms that started 10 days ago. He has had some stuffy head, PND, and a dry cough. No fever or headache or SOB or chest pain. No body aches or NVD. He has taken some OTC decongestants, and now he feels much better. He asks about a flu shot.    ROS: See pertinent positives and negatives per HPI.  Past Medical History:  Diagnosis Date  . Allergy   . ANEMIA-IRON DEFICIENCY 04/26/2007  . Diabetes mellitus without complication ([place])   . ELEVATED BP READING WITHOUT DX HYPERTENSION 04/26/2007  . GERD 01/04/2007  . LEUKOCYTOPENIA UNSPECIFIED 01/24/2009    Past Surgical History:  Procedure Laterality Date  . COLONOSCOPY  11/19/2016   per Dr. Fuller Plan, adenomatous polyps, repeat in 5 yrs     Family History  Problem Relation Age of Onset  . Arthritis Mother   . Colon cancer Mother      Current Outpatient Medications:  .  diclofenac (VOLTAREN) 75 MG EC tablet, Take 1 tablet (75 mg total) by mouth 2 (two) times daily., Disp: 60 tablet, Rfl: 2 .  losartan (COZAAR) 25 MG tablet, TAKE 1 TABLET BY MOUTH EVERY DAY, Disp: 90 tablet, Rfl: 3 .  losartan (COZAAR) 50 MG tablet, Take 1 tablet (50 mg total) by mouth daily., Disp: 90 tablet, Rfl: 3 .  Multiple Vitamins-Minerals (CENTRUM SILVER PO), Take by mouth as needed., Disp: , Rfl:  .  promethazine  (PHENERGAN) 25 MG tablet, Take 1 tablet (25 mg total) by mouth every 6 (six) hours as needed for nausea or vomiting., Disp: 20 tablet, Rfl: 2 .  tamsulosin (FLOMAX) 0.4 MG CAPS capsule, Take 1 capsule (0.4 mg total) by mouth daily., Disp: 30 capsule, Rfl: 3  Current Facility-Administered Medications:  .  0.9 %  sodium chloride infusion, 500 mL, Intravenous, Continuous, Fuller Plan, Pricilla Riffle, MD  EXAM:  VITALS per patient if applicable:  GENERAL: alert, oriented, appears well and in no acute distress  HEENT: atraumatic, conjunttiva clear, no obvious abnormalities on inspection of external nose and ears  NECK: normal movements of the head and neck  LUNGS: on inspection no signs of respiratory distress, breathing rate appears normal, no obvious gross SOB, gasping or wheezing  CV: no obvious cyanosis  MS: moves all visible extremities without noticeable abnormality  PSYCH/NEURO: pleasant and cooperative, no obvious depression or anxiety, speech and thought processing grossly intact  ASSESSMENT AND PLAN: He has had a viral URI, and it sounds like heis almost over this. I advised him to wait several weeks before he gets the flu shot.  Alysia Penna, MD  Discussed the following assessment and plan:  No diagnosis found.     I discussed the assessment and treatment plan with the patient. The patient was provided an opportunity to ask questions and all were answered. The patient agreed with the plan and demonstrated  an understanding of the instructions.   The patient was advised to call back or seek an in-person evaluation if the symptoms worsen or if the condition fails to improve as anticipated.     Review of Systems     Objective:   Physical Exam        Assessment & Plan:

## 2019-11-29 ENCOUNTER — Telehealth: Payer: Self-pay | Admitting: Family Medicine

## 2019-11-29 ENCOUNTER — Encounter: Payer: Self-pay | Admitting: Family Medicine

## 2019-11-29 NOTE — Telephone Encounter (Signed)
Pt said he is still coughing up phlegm, nose running and a lot of mucus from that. He would like something called in maybe some antibiotics. He said it feels more like a chest cold than a head cold.  Please advise    [id number] [address] [place] [address]  [place], [place] [address]  Phone:  [phone] Fax:  [phone]

## 2019-11-30 NOTE — Telephone Encounter (Signed)
Please help pt schedule an appointment as Dr. Sarajane Jews is out of the office until Monday.

## 2019-12-01 ENCOUNTER — Telehealth: Payer: Medicare PPO | Admitting: Family Medicine

## 2019-12-01 DIAGNOSIS — R059 Cough, unspecified: Secondary | ICD-10-CM

## 2019-12-01 DIAGNOSIS — R05 Cough: Secondary | ICD-10-CM

## 2019-12-01 MED ORDER — BENZONATATE 100 MG PO CAPS: 100.0000 mg | ORAL_CAPSULE | Freq: Three times a day (TID) | ORAL | 0 refills | Status: DC | PRN

## 2019-12-01 MED ORDER — DOXYCYCLINE HYCLATE 100 MG PO TABS: 100.0000 mg | ORAL_TABLET | Freq: Two times a day (BID) | ORAL | 0 refills | Status: DC

## 2019-12-01 NOTE — Progress Notes (Signed)
Virtual Visit via Video Note  I connected with Bryan Smith  on 12/01/19 at 12:00 PM EDT by a video enabled telemedicine application and verified that I am speaking with the correct person using two identifiers.  Location patient: home, [place] Location provider:work or home office Persons participating in the virtual visit: patient, provider  I discussed the limitations of evaluation and management by telemedicine and the availability of in person appointments. The patient expressed understanding and agreed to proceed.   HPI:  Acute telemedicine visit for cough: -Onset: started about 2-3 weeks ago with a head cold, was improving some but then it seemed like it worsened the last 6 days with thicker phlegm, worsening cough, ? Little rattle in chests occ when takes a deep breath -Denies:fevers, CP, SOB, NVD, loss of taste or smell, hemoptysis -has been home, isolated -Has tried: chicken soup, robitussin -Pertinent past medical history: denies hx of lung disease, but reports used to get bronchitis or prolonged cough after illnesses -Pertinent medication allergies: lisinopril -COVID-19 vaccine status: fully vaccinated - pfizer  ROS: See pertinent positives and negatives per HPI.  Past Medical History:  Diagnosis Date  . Allergy   . ANEMIA-IRON DEFICIENCY 04/26/2007  . Diabetes mellitus without complication ([place])   . ELEVATED BP READING WITHOUT DX HYPERTENSION 04/26/2007  . GERD 01/04/2007  . LEUKOCYTOPENIA UNSPECIFIED 01/24/2009    Past Surgical History:  Procedure Laterality Date  . COLONOSCOPY  11/19/2016   per Dr. Fuller Plan, adenomatous polyps, repeat in 5 yrs      Current Outpatient Medications:  .  benzonatate (TESSALON PERLES) 100 MG capsule, Take 1 capsule (100 mg total) by mouth 3 (three) times daily as needed., Disp: 20 capsule, Rfl: 0 .  diclofenac (VOLTAREN) 75 MG EC tablet, Take 1 tablet (75 mg total) by mouth 2 (two) times daily., Disp: 60 tablet, Rfl: 2 .  doxycycline  (VIBRA-TABS) 100 MG tablet, Take 1 tablet (100 mg total) by mouth 2 (two) times daily., Disp: 14 tablet, Rfl: 0 .  losartan (COZAAR) 25 MG tablet, TAKE 1 TABLET BY MOUTH EVERY DAY, Disp: 90 tablet, Rfl: 3 .  losartan (COZAAR) 50 MG tablet, Take 1 tablet (50 mg total) by mouth daily., Disp: 90 tablet, Rfl: 3 .  Multiple Vitamins-Minerals (CENTRUM SILVER PO), Take by mouth as needed., Disp: , Rfl:  .  promethazine (PHENERGAN) 25 MG tablet, Take 1 tablet (25 mg total) by mouth every 6 (six) hours as needed for nausea or vomiting., Disp: 20 tablet, Rfl: 2 .  tamsulosin (FLOMAX) 0.4 MG CAPS capsule, Take 1 capsule (0.4 mg total) by mouth daily., Disp: 30 capsule, Rfl: 3  Current Facility-Administered Medications:  .  0.9 %  sodium chloride infusion, 500 mL, Intravenous, Continuous, Fuller Plan, Pricilla Riffle, MD  EXAM:  VITALS per patient if applicable: denies   GENERAL: alert, oriented, appears well and in no acute distress  HEENT: atraumatic, conjunttiva clear, no obvious abnormalities on inspection of external nose and ears  NECK: normal movements of the head and neck  LUNGS: on inspection no signs of respiratory distress, breathing rate appears normal, no obvious gross SOB, gasping or wheezing  CV: no obvious cyanosis  MS: moves all visible extremities without noticeable abnormality  PSYCH/NEURO: pleasant and cooperative, no obvious depression or anxiety, speech and thought processing grossly intact  ASSESSMENT AND PLAN:  Discussed the following assessment and plan:  Cough  -we discussed possible serious and likely etiologies, options for evaluation and workup, limitations of telemedicine visit vs in person visit,  treatment, treatment risks and precautions. Pt prefers to treat via telemedicine empirically rather than in person at this moment. Given duration of symptoms discussed possibility of 2ndary sinusitis, bronchitis or CAP vs new VURI vs other. He opted for empiric treatment with doxy  100mg  bidx 7 days and Tessalon for cough. Discussed his reported propensity for bronchitis type symptoms and possible steroids, but opted against for now due to his hx of hyperglycemia. Work/School slipped offered:  declined Scheduled follow up with PCP offered: Patient would like folllow up with PCP next week. Sent message to schedulers to assist and advised patient to contact PCP office to schedule if does not receive call back in next 24 hours. Advised to seek prompt follow up telemedicine visit or in person care if worsening, new symptoms arise, or if is not improving with treatment. Did let this patient know that I only do telemedicine on Tuesdays and Thursdays for [place]. Advised to schedule follow up visit with PCP or [facility] if any further questions or concerns to avoid delays in care.   I discussed the assessment and treatment plan with the patient. The patient was provided an opportunity to ask questions and all were answered. The patient agreed with the plan and demonstrated an understanding of the instructions.     [name], DO

## 2019-12-01 NOTE — Patient Instructions (Addendum)
-  I sent the medication(s) we discussed to your pharmacy: Meds ordered this encounter  Medications  . doxycycline (VIBRA-TABS) 100 MG tablet    Sig: Take 1 tablet (100 mg total) by mouth 2 (two) times daily.    Dispense:  14 tablet    Refill:  0  . benzonatate (TESSALON PERLES) 100 MG capsule    Sig: Take 1 capsule (100 mg total) by mouth 3 (three) times daily as needed.    Dispense:  20 capsule    Refill:  0    Follow up with your doctor next week. Please call to schedule.  -stay home while sick, and if you have [place] please stay home for a full 10 days since the onset of symptoms PLUS one day of no fever and feeling better  -[place] COVID19 testing information: [email or web address] OR [phone]   -stay hydrated, drink plenty of fluids and eat small healthy meals - avoid dairy  I hope you are feeling better soon! Seek care promptly if your symptoms worsen, new concerns arise or you are not improving with treatment.  Please Note: I only do telemedicine on Tuesdays and Thursdays for [place]. Please schedule follow up visit with PCP or [facility] if any further questions or concerns to avoid delays in care.

## 2019-12-03 DIAGNOSIS — Z20822 Contact with and (suspected) exposure to covid-19: Secondary | ICD-10-CM | POA: Diagnosis not present

## 2019-12-09 ENCOUNTER — Encounter: Payer: Self-pay | Admitting: Family Medicine

## 2019-12-13 NOTE — Telephone Encounter (Signed)
I suggest he go ahead and get the flu shot now, then wait 2 weeks before getting the Covid booster

## 2019-12-20 ENCOUNTER — Encounter: Payer: Self-pay | Admitting: Family Medicine

## 2019-12-20 ENCOUNTER — Other Ambulatory Visit: Payer: Self-pay | Admitting: Family Medicine

## 2019-12-22 MED ORDER — BENZONATATE 100 MG PO CAPS: 200.0000 mg | ORAL_CAPSULE | Freq: Three times a day (TID) | ORAL | 1 refills | Status: DC | PRN

## 2019-12-22 NOTE — Telephone Encounter (Signed)
I sent in a refill.

## 2020-01-02 ENCOUNTER — Encounter: Payer: Self-pay | Admitting: Family Medicine

## 2020-01-02 NOTE — Telephone Encounter (Signed)
Set up an in person OV so I can examine him and possibly get a CXR

## 2020-01-03 ENCOUNTER — Encounter: Payer: Self-pay | Admitting: Family Medicine

## 2020-01-03 ENCOUNTER — Other Ambulatory Visit: Payer: Self-pay

## 2020-01-03 ENCOUNTER — Ambulatory Visit: Payer: Medicare PPO | Admitting: Family Medicine

## 2020-01-03 VITALS — BP 124/74 | HR 97 | Temp 98.7°F | Ht 71.0 in | Wt 220.8 lb

## 2020-01-03 DIAGNOSIS — R059 Cough, unspecified: Secondary | ICD-10-CM

## 2020-01-03 MED ORDER — FLOVENT HFA 110 MCG/ACT IN AERO: 2.0000 | INHALATION_SPRAY | Freq: Two times a day (BID) | RESPIRATORY_TRACT | 0 refills | Status: DC

## 2020-01-03 NOTE — Telephone Encounter (Signed)
Patient is scheduled for this afternoon at 4 PM

## 2020-01-03 NOTE — Progress Notes (Signed)
   Subjective:    Patient ID: Bryan Smith, male    DOB: 04-04-1955, 64 y.o.   MRN: [id number]  HPI Here for 7 weeks of a dry cough. This started with viral URI symptoms of stuffy head and PND, but these have resolved. Most of the time he feels fine, but he has spells of coughing. No fover or wheezing or SOB. Benzonatate helps for a few hours. Today has actually been the best he has had in awhile.    Review of Systems  Constitutional: Negative.   HENT: Negative.   Eyes: Negative.   Respiratory: Positive for cough. Negative for choking, chest tightness, shortness of breath and wheezing.   Cardiovascular: Negative.        Objective:   Physical Exam Constitutional:      Appearance: Normal appearance. He is not ill-appearing.  Cardiovascular:     Rate and Rhythm: Normal rate and regular rhythm.     Pulses: Normal pulses.     Heart sounds: Normal heart sounds.  Pulmonary:     Effort: Pulmonary effort is normal.     Breath sounds: Normal breath sounds.  Lymphadenopathy:     Cervical: No cervical adenopathy.  Neurological:     Mental Status: He is alert.           Assessment & Plan:  His cough seems to be a lingering effect of the URI. He will try a Flovent inhaler BID to reduce bronchial inflammation. This should resolve soon.  Alysia Penna, MD

## 2020-01-13 ENCOUNTER — Encounter: Payer: Self-pay | Admitting: Family Medicine

## 2020-01-13 NOTE — Telephone Encounter (Signed)
Yes that should be fine

## 2020-01-17 DIAGNOSIS — R972 Elevated prostate specific antigen [PSA]: Secondary | ICD-10-CM | POA: Diagnosis not present

## 2020-03-09 ENCOUNTER — Other Ambulatory Visit: Payer: Self-pay | Admitting: Family Medicine

## 2020-03-13 ENCOUNTER — Encounter: Payer: Self-pay | Admitting: Family Medicine

## 2020-04-01 ENCOUNTER — Encounter: Payer: Self-pay | Admitting: Family Medicine

## 2020-04-02 ENCOUNTER — Other Ambulatory Visit: Payer: Self-pay

## 2020-04-02 MED ORDER — ESOMEPRAZOLE MAGNESIUM 40 MG PO CPDR: 40.0000 mg | DELAYED_RELEASE_CAPSULE | Freq: Every day | ORAL | 12 refills | Status: DC

## 2020-04-02 NOTE — Telephone Encounter (Signed)
Please refill this for one year  

## 2020-04-26 ENCOUNTER — Telehealth: Payer: Self-pay | Admitting: Family Medicine

## 2020-04-26 NOTE — Telephone Encounter (Signed)
Left message for patient to call back and schedule Medicare Annual Wellness Visit (AWV) either virtually or in office. No detailed message left  Last AWVI  No information   please schedule at anytime with [facility] Nurse Health Advisor 1 or 2   This should be a 45 minute visit.

## 2020-05-15 ENCOUNTER — Other Ambulatory Visit: Payer: Self-pay

## 2020-05-15 ENCOUNTER — Ambulatory Visit (INDEPENDENT_AMBULATORY_CARE_PROVIDER_SITE_OTHER): Payer: Medicare PPO

## 2020-05-15 VITALS — BP 118/78 | HR 80 | Temp 98.2°F | Wt 227.2 lb

## 2020-05-15 DIAGNOSIS — Z0000 Encounter for general adult medical examination without abnormal findings: Secondary | ICD-10-CM | POA: Diagnosis not present

## 2020-05-15 NOTE — Patient Instructions (Addendum)
Bryan Smith , Thank you for taking time to come for your Medicare Wellness Visit. I appreciate your ongoing commitment to your health goals. Please review the following plan we discussed and let me know if I can assist you in the future.   Screening recommendations/referrals: Colonoscopy: Done 11/19/16 Recommended yearly ophthalmology/optometry visit for glaucoma screening and checkup Recommended yearly dental visit for hygiene and checkup  Vaccinations: Influenza vaccine: Done 12/13/19 Pneumococcal vaccine: Due and discussed Tdap vaccine: Due and discussed Shingles vaccine: Done 01/10/19  & 06/29/19 Covid-19: Completed 1/23, 2/23, & 12/30/19  Advanced directives: Advance directive discussed with you today. I have provided a copy for you to complete at home and have notarized. Once this is complete please bring a copy in to our office so we can scan it into your chart.  Conditions/risks identified: Stay Healthy  Next appointment: Follow up in one year for your annual wellness visit.   Preventive Care 67 Years and Older, Male Preventive care refers to lifestyle choices and visits with your health care provider that can promote health and wellness. What does preventive care include?  A yearly physical exam. This is also called an annual well check.  Dental exams once or twice a year.  Routine eye exams. Ask your health care provider how often you should have your eyes checked.  Personal lifestyle choices, including:  Daily care of your teeth and gums.  Regular physical activity.  Eating a healthy diet.  Avoiding tobacco and drug use.  Limiting alcohol use.  Practicing safe sex.  Taking low doses of aspirin every day.  Taking vitamin and mineral supplements as recommended by your health care provider. What happens during an annual well check? The services and screenings done by your health care provider during your annual well check will depend on your age, overall health,  lifestyle risk factors, and family history of disease. Counseling  Your health care provider may ask you questions about your:  Alcohol use.  Tobacco use.  Drug use.  Emotional well-being.  Home and relationship well-being.  Sexual activity.  Eating habits.  History of falls.  Memory and ability to understand (cognition).  Work and work Statistician. Screening  You may have the following tests or measurements:  Height, weight, and BMI.  Blood pressure.  Lipid and cholesterol levels. These may be checked every 5 years, or more frequently if you are over 12 years old.  Skin check.  Lung cancer screening. You may have this screening every year starting at age 67 if you have a 30-pack-year history of smoking and currently smoke or have quit within the past 15 years.  Fecal occult blood test (FOBT) of the stool. You may have this test every year starting at age 67.  Flexible sigmoidoscopy or colonoscopy. You may have a sigmoidoscopy every 5 years or a colonoscopy every 10 years starting at age 37.  Prostate cancer screening. Recommendations will vary depending on your family history and other risks.  Hepatitis C blood test.  Hepatitis B blood test.  Sexually transmitted disease (STD) testing.  Diabetes screening. This is done by checking your blood sugar (glucose) after you have not eaten for a while (fasting). You may have this done every 1-3 years.  Abdominal aortic aneurysm (AAA) screening. You may need this if you are a current or former smoker.  Osteoporosis. You may be screened starting at age 15 if you are at high risk. Talk with your health care provider about your test results, treatment options,  and if necessary, the need for more tests. Vaccines  Your health care provider may recommend certain vaccines, such as:  Influenza vaccine. This is recommended every year.  Tetanus, diphtheria, and acellular pertussis (Tdap, Td) vaccine. You may need a Td booster  every 10 years.  Zoster vaccine. You may need this after age 40.  Pneumococcal 13-valent conjugate (PCV13) vaccine. One dose is recommended after age 64.  Pneumococcal polysaccharide (PPSV23) vaccine. One dose is recommended after age 24. Talk to your health care provider about which screenings and vaccines you need and how often you need them. This information is not intended to replace advice given to you by your health care provider. Make sure you discuss any questions you have with your health care provider. Document Released: 03/23/2015 Document Revised: 11/14/2015 Document Reviewed: 12/26/2014 Elsevier Interactive Patient Education  2017 [place] Prevention in the Home Falls can cause injuries. They can happen to people of all ages. There are many things you can do to make your home safe and to help prevent falls. What can I do on the outside of my home?  Regularly fix the edges of walkways and driveways and fix any cracks.  Remove anything that might make you trip as you walk through a door, such as a raised step or threshold.  Trim any bushes or trees on the path to your home.  Use bright outdoor lighting.  Clear any walking paths of anything that might make someone trip, such as rocks or tools.  Regularly check to see if handrails are loose or broken. Make sure that both sides of any steps have handrails.  Any raised decks and porches should have guardrails on the edges.  Have any leaves, snow, or ice cleared regularly.  Use sand or salt on walking paths during winter.  Clean up any spills in your garage right away. This includes oil or grease spills. What can I do in the bathroom?  Use night lights.  Install grab bars by the toilet and in the tub and shower. Do not use towel bars as grab bars.  Use non-skid mats or decals in the tub or shower.  If you need to sit down in the shower, use a plastic, non-slip stool.  Keep the floor dry. Clean up any  water that spills on the floor as soon as it happens.  Remove soap buildup in the tub or shower regularly.  Attach bath mats securely with double-sided non-slip rug tape.  Do not have throw rugs and other things on the floor that can make you trip. What can I do in the bedroom?  Use night lights.  Make sure that you have a light by your bed that is easy to reach.  Do not use any sheets or blankets that are too big for your bed. They should not hang down onto the floor.  Have a firm chair that has side arms. You can use this for support while you get dressed.  Do not have throw rugs and other things on the floor that can make you trip. What can I do in the kitchen?  Clean up any spills right away.  Avoid walking on wet floors.  Keep items that you use a lot in easy-to-reach places.  If you need to reach something above you, use a strong step stool that has a grab bar.  Keep electrical cords out of the way.  Do not use floor polish or wax that makes floors slippery. If  you must use wax, use non-skid floor wax.  Do not have throw rugs and other things on the floor that can make you trip. What can I do with my stairs?  Do not leave any items on the stairs.  Make sure that there are handrails on both sides of the stairs and use them. Fix handrails that are broken or loose. Make sure that handrails are as long as the stairways.  Check any carpeting to make sure that it is firmly attached to the stairs. Fix any carpet that is loose or worn.  Avoid having throw rugs at the top or bottom of the stairs. If you do have throw rugs, attach them to the floor with carpet tape.  Make sure that you have a light switch at the top of the stairs and the bottom of the stairs. If you do not have them, ask someone to add them for you. What else can I do to help prevent falls?  Wear shoes that:  Do not have high heels.  Have rubber bottoms.  Are comfortable and fit you well.  Are closed  at the toe. Do not wear sandals.  If you use a stepladder:  Make sure that it is fully opened. Do not climb a closed stepladder.  Make sure that both sides of the stepladder are locked into place.  Ask someone to hold it for you, if possible.  Clearly mark and make sure that you can see:  Any grab bars or handrails.  First and last steps.  Where the edge of each step is.  Use tools that help you move around (mobility aids) if they are needed. These include:  Canes.  Walkers.  Scooters.  Crutches.  Turn on the lights when you go into a dark area. Replace any light bulbs as soon as they burn out.  Set up your furniture so you have a clear path. Avoid moving your furniture around.  If any of your floors are uneven, fix them.  If there are any pets around you, be aware of where they are.  Review your medicines with your doctor. Some medicines can make you feel dizzy. This can increase your chance of falling. Ask your doctor what other things that you can do to help prevent falls. This information is not intended to replace advice given to you by your health care provider. Make sure you discuss any questions you have with your health care provider. Document Released: 12/21/2008 Document Revised: 08/02/2015 Document Reviewed: 03/31/2014 Elsevier Interactive Patient Education  2017 [employer].

## 2020-05-15 NOTE — Progress Notes (Signed)
Subjective:   Bryan Smith is a 65 y.o. male who presents for an Initial Medicare Annual Wellness Visit.  Review of Systems     Cardiac Risk Factors include: advanced age (>2men, >46 women);diabetes mellitus;male gender;hypertension;obesity (BMI >30kg/m2)     Objective:    Today's Vitals   05/15/20 1254  BP: 118/78  Pulse: 80  Temp: 98.2 F (36.8 C)  SpO2: 100%  Weight: 227 lb 3.2 oz (103.1 kg)  PainSc: 4    Body mass index is 31.69 kg/m.  Advanced Directives 05/15/2020 06/10/2019 11/19/2016 11/05/2016 02/05/2016  Does Patient Have a Medical Advance Directive? No No No No Yes  Does patient want to make changes to medical advance directive? - - - - No - Patient declined  Would patient like information on creating a medical advance directive? Yes (MAU/Ambulatory/Procedural Areas - Information given) - - - -    Current Medications (verified) Outpatient Encounter Medications as of 05/15/2020  Medication Sig  . esomeprazole (NEXIUM) 40 MG capsule Take 1 capsule (40 mg total) by mouth daily.  Marland Kitchen losartan (COZAAR) 50 MG tablet Take 1 tablet (50 mg total) by mouth daily.  . Multiple Vitamins-Minerals (CENTRUM SILVER PO) Take by mouth as needed.  . promethazine (PHENERGAN) 25 MG tablet Take 1 tablet (25 mg total) by mouth every 6 (six) hours as needed for nausea or vomiting.  . tamsulosin (FLOMAX) 0.4 MG CAPS capsule Take 1 capsule (0.4 mg total) by mouth daily.  . diclofenac (VOLTAREN) 75 MG EC tablet Take 1 tablet (75 mg total) by mouth 2 (two) times daily. (Patient not taking: Reported on 05/15/2020)  . [DISCONTINUED] benzonatate (TESSALON PERLES) 100 MG capsule Take 2 capsules (200 mg total) by mouth 3 (three) times daily as needed for cough. (Patient not taking: Reported on 05/15/2020)  . [DISCONTINUED] doxycycline (VIBRA-TABS) 100 MG tablet Take 1 tablet (100 mg total) by mouth 2 (two) times daily. (Patient not taking: Reported on 05/15/2020)  . [DISCONTINUED] fluticasone (FLOVENT  HFA) 110 MCG/ACT inhaler Inhale 2 puffs into the lungs in the morning and at bedtime.   Facility-Administered Encounter Medications as of 05/15/2020  Medication  . 0.9 %  sodium chloride infusion    Allergies (verified) Lisinopril   History: Past Medical History:  Diagnosis Date  . Allergy   . ANEMIA-IRON DEFICIENCY 04/26/2007  . Diabetes mellitus without complication ([place])   . ELEVATED BP READING WITHOUT DX HYPERTENSION 04/26/2007  . GERD 01/04/2007  . LEUKOCYTOPENIA UNSPECIFIED 01/24/2009   Past Surgical History:  Procedure Laterality Date  . COLONOSCOPY  11/19/2016   per Dr. Fuller Plan, adenomatous polyps, repeat in 5 yrs    Family History  Problem Relation Age of Onset  . Arthritis Mother   . Colon cancer Mother    Social History   Socioeconomic History  . Marital status: Single    Spouse name: Not on file  . Number of children: Not on file  . Years of education: Not on file  . Highest education level: Not on file  Occupational History  . Occupation: retired  Tobacco Use  . Smoking status: Never Smoker  . Smokeless tobacco: Never Used  Substance and Sexual Activity  . Alcohol use: No    Alcohol/week: 0.0 standard drinks  . Drug use: No  . Sexual activity: Not on file  Other Topics Concern  . Not on file  Social History Narrative  . Not on file   Social Determinants of Health   Financial Resource Strain: Low Risk   .  Difficulty of Paying Living Expenses: Not hard at all  Food Insecurity: No Food Insecurity  . Worried About Charity fundraiser in the Last Year: Never true  . Ran Out of Food in the Last Year: Never true  Transportation Needs: No Transportation Needs  . Lack of Transportation (Medical): No  . Lack of Transportation (Non-Medical): No  Physical Activity: Sufficiently Active  . Days of Exercise per Week: 4 days  . Minutes of Exercise per Session: 60 min  Stress: Stress Concern Present  . Feeling of Stress : To some extent  Social Connections:  Moderately Isolated  . Frequency of Communication with Friends and Family: More than three times a week  . Frequency of Social Gatherings with Friends and Family: More than three times a week  . Attends Religious Services: More than 4 times per year  . Active Member of Clubs or Organizations: No  . Attends Archivist Meetings: Never  . Marital Status: Never married    Tobacco Counseling Counseling given: Not Answered   Clinical Intake:  Pre-visit preparation completed: Yes  Pain : 0-10 Pain Score: 4  Pain Type: Chronic pain Pain Location: Neck (shoulders bilat) Pain Orientation: Left,Right Pain Descriptors / Indicators: Aching,Dull Pain Onset: 1 to 4 weeks ago Pain Frequency: Intermittent     BMI - recorded: 31.69 Nutritional Status: BMI > 30  Obese Nutritional Risks: None Diabetes: Yes CBG done?: No Did pt. bring in CBG monitor from home?: No  How often do you need to have someone help you when you read instructions, pamphlets, or other written materials from your doctor or pharmacy?: 1 - Never  Diabetic?Nutrition Risk Assessment:  Has the patient had any N/V/D within the last 2 months?  No  Does the patient have any non-healing wounds?  No  Has the patient had any unintentional weight loss or weight gain?  No   Diabetes:  Is the patient diabetic?  Yes  If diabetic, was a CBG obtained today?  No  Did the patient bring in their glucometer from home?  No  How often do you monitor your CBG's? N/A Financial Strains and Diabetes Management:  Are you having any financial strains with the device, your supplies or your medication? No .  Does the patient want to be seen by Chronic Care Management for management of their diabetes?  No  Would the patient like to be referred to a Nutritionist or for Diabetic Management?  No   Diabetic Exams:  Diabetic Eye Exam: Overdue for diabetic eye exam. Pt has been advised about the importance in completing this exam.  Patient advised to call and schedule an eye exam. Pt stated he had exam in 06/2019  Diabetic Foot Exam: Overdue, Pt has been advised about the importance in completing this exam. Pt is scheduled for diabetic foot exam on 06/27/20 at appt. Interpreter Needed?: No  Information entered by :: Charlott Rakes, LPN   Activities of Daily Living In your present state of health, do you have any difficulty performing the following activities: 05/15/2020  Hearing? N  Vision? N  Difficulty concentrating or making decisions? N  Walking or climbing stairs? N  Dressing or bathing? N  Doing errands, shopping? N  Preparing Food and eating ? N  Using the Toilet? N  In the past six months, have you accidently leaked urine? N  Do you have problems with loss of bowel control? N  Managing your Medications? N  Managing your Finances? N  Housekeeping  or managing your Housekeeping? N  Some recent data might be hidden    Patient Care Team: Laurey Morale, MD as PCP - General  Indicate any recent Medical Services you may have received from other than [facility] providers in the past year (date may be approximate).     Assessment:   This is a routine wellness examination for Bryan Smith.  Hearing/Vision screen  Hearing Screening   125Hz  250Hz  500Hz  1000Hz  2000Hz  3000Hz  4000Hz  6000Hz  8000Hz   Right ear:           Left ear:           Comments: Pt denies any hearing issues   Vision Screening Comments: Pt follows up with Dr Camillo Flaming for annual eye exams   Dietary issues and exercise activities discussed: Current Exercise Habits: Home exercise routine;Structured exercise class, Type of exercise: walking;treadmill;stretching;strength training/weights, Time (Minutes): > 60, Frequency (Times/Week): 4, Weekly Exercise (Minutes/Week): 0  Goals    . Patient Stated     Stay healthy       Depression Screen PHQ 2/9 Scores 05/15/2020 04/06/2018 02/19/2016 02/12/2016 02/05/2016  PHQ - 2 Score 1 0 0 0 0    Fall  Risk Fall Risk  05/15/2020 04/06/2018 02/19/2016 02/12/2016 02/05/2016  Falls in the past year? 0 0 No No No  Number falls in past yr: 0 - - - -  Injury with Fall? 0 - - - -  Risk for fall due to : Impaired vision - - - -  Follow up Falls prevention discussed - - - -    FALL RISK PREVENTION PERTAINING TO THE HOME:  Any stairs in or around the home? No  If so, are there any without handrails? No  Home free of loose throw rugs in walkways, pet beds, electrical cords, etc? Yes  Adequate lighting in your home to reduce risk of falls? Yes   ASSISTIVE DEVICES UTILIZED TO PREVENT FALLS:  Life alert? No  Use of a cane, walker or w/c? No  Grab bars in the bathroom? No  Shower chair or bench in shower? No  Elevated toilet seat or a handicapped toilet? No   TIMED UP AND GO:  Was the test performed? Yes .  Length of time to ambulate 10 feet: 10 sec.   Gait steady and fast without use of assistive device  Cognitive Function:     6CIT Screen 05/15/2020  What Year? 0 points  What month? 0 points  Count back from 20 0 points  Months in reverse 0 points  Repeat phrase 2 points    Immunizations Immunization History  Administered Date(s) Administered  . Fluad Quad(high Dose 65+) 12/21/2018  . Influenza,inj,Quad PF,6+ Mos 01/17/2016, 12/10/2016, 12/24/2017  . Influenza-Unspecified 12/13/2019  . PFIZER(Purple Top)SARS-COV-2 Vaccination 04/02/2019, 05/03/2019, 12/30/2019  . Pneumococcal Conjugate-13 04/06/2018  . Zoster 08/14/2014  . Zoster Recombinat (Shingrix) 01/10/2019, 06/29/2019    TDAP status: Due, Education has been provided regarding the importance of this vaccine. Advised may receive this vaccine at local pharmacy or Health Dept. Aware to provide a copy of the vaccination record if obtained from local pharmacy or Health Dept. Verbalized acceptance and understanding.  Flu Vaccine status: Due, Education has been provided regarding the importance of this vaccine. Advised may  receive this vaccine at local pharmacy or Health Dept. Aware to provide a copy of the vaccination record if obtained from local pharmacy or Health Dept. Verbalized acceptance and understanding.  Pneumococcal vaccine status: Due, Education has been provided regarding the  importance of this vaccine. Advised may receive this vaccine at local pharmacy or Health Dept. Aware to provide a copy of the vaccination record if obtained from local pharmacy or Health Dept. Verbalized acceptance and understanding.  Covid-19 vaccine status: Completed vaccines  Qualifies for Shingles Vaccine? Yes   Zostavax completed Yes   Shingrix Completed?: Yes  Screening Tests Health Maintenance  Topic Date Due  . Hepatitis C Screening  Never done  . FOOT EXAM  Never done  . TETANUS/TDAP  Never done  . PNA vac Low Risk Adult (2 of 2 - PPSV23) 04/07/2019  . HEMOGLOBIN A1C  12/27/2019  . OPHTHALMOLOGY EXAM  06/08/2020  . COLONOSCOPY (Pts 45-51yrs Insurance coverage will need to be confirmed)  11/19/2021  . INFLUENZA VACCINE  Completed  . COVID-19 Vaccine  Completed  . HPV VACCINES  Aged Out    Health Maintenance  Health Maintenance Due  Topic Date Due  . Hepatitis C Screening  Never done  . FOOT EXAM  Never done  . TETANUS/TDAP  Never done  . PNA vac Low Risk Adult (2 of 2 - PPSV23) 04/07/2019  . HEMOGLOBIN A1C  12/27/2019    Colorectal cancer screening: Type of screening: Colonoscopy. Completed 11/19/16. Repeat every 5 years  Additional Screening:  Hepatitis C Screening: does qualify  Vision Screening: Recommended annual ophthalmology exams for early detection of glaucoma and other disorders of the eye. Is the patient up to date with their annual eye exam?  Yes  Who is the provider or what is the name of the office in which the patient attends annual eye exams? Dr Camillo Flaming If pt is not established with a provider, would they like to be referred to a provider to establish care? No .   Dental  Screening: Recommended annual dental exams for proper oral hygiene  Community Resource Referral / Chronic Care Management: CRR required this visit?  No   CCM required this visit?  No      Plan:     I have personally reviewed and noted the following in the patient's chart:   . Medical and social history . Use of alcohol, tobacco or illicit drugs  . Current medications and supplements . Functional ability and status . Nutritional status . Physical activity . Advanced directives . List of other physicians . Hospitalizations, surgeries, and ER visits in previous 12 months . Vitals . Screenings to include cognitive, depression, and falls . Referrals and appointments  In addition, I have reviewed and discussed with patient certain preventive protocols, quality metrics, and best practice recommendations. A written personalized care plan for preventive services as well as general preventive health recommendations were provided to patient.     Willette Brace, LPN   07/15/2618   Nurse Notes: None

## 2020-05-20 ENCOUNTER — Encounter: Payer: Self-pay | Admitting: Family Medicine

## 2020-05-21 ENCOUNTER — Other Ambulatory Visit: Payer: Self-pay

## 2020-05-21 ENCOUNTER — Other Ambulatory Visit: Payer: Self-pay | Admitting: Family Medicine

## 2020-05-21 NOTE — Telephone Encounter (Signed)
Set up an in person OV so I can see him

## 2020-05-22 ENCOUNTER — Ambulatory Visit: Payer: Medicare PPO | Admitting: Family Medicine

## 2020-05-22 ENCOUNTER — Encounter: Payer: Self-pay | Admitting: Family Medicine

## 2020-05-22 VITALS — BP 128/80 | HR 82 | Temp 98.2°F | Wt 227.0 lb

## 2020-05-22 DIAGNOSIS — M542 Cervicalgia: Secondary | ICD-10-CM | POA: Diagnosis not present

## 2020-05-22 MED ORDER — CYCLOBENZAPRINE HCL 10 MG PO TABS: 10.0000 mg | ORAL_TABLET | Freq: Three times a day (TID) | ORAL | 2 refills | Status: DC | PRN

## 2020-05-22 MED ORDER — METHYLPREDNISOLONE 4 MG PO TBPK: ORAL_TABLET | ORAL | 0 refills | Status: DC

## 2020-05-22 NOTE — Progress Notes (Signed)
   Subjective:    Patient ID: Bryan Smith, male    DOB: 1955/06/09, 65 y.o.   MRN: [id number]  HPI Here for 4 days of mild pain in the back of the neck and some burning that shoots down the left arm. No weakness. No recent trauma. He had similar symptoms form a pinched nerve about 15 years ago. He is using heat and a TENS unit he has at home.    Review of Systems  Constitutional: Negative.   Respiratory: Negative.   Cardiovascular: Negative.   Musculoskeletal: Positive for neck pain.       Objective:   Physical Exam Constitutional:      General: He is not in acute distress. Cardiovascular:     Rate and Rhythm: Normal rate and regular rhythm.     Pulses: Normal pulses.     Heart sounds: Normal heart sounds.  Pulmonary:     Effort: Pulmonary effort is normal.     Breath sounds: Normal breath sounds.  Musculoskeletal:     Comments: Mildly tender to the left of the spine at the C5 level, and the tenderness extends to the top of the left shoulder. ROM of the neck and shoulder are intact.   Neurological:     Mental Status: He is alert.           Assessment & Plan:  Pinched nerve in the neck. He will apply heat and he will take a Medrol dose pack. Add Flexeril as needed.  Alysia Penna, MD

## 2020-05-28 ENCOUNTER — Encounter: Payer: Self-pay | Admitting: Family Medicine

## 2020-05-28 DIAGNOSIS — M542 Cervicalgia: Secondary | ICD-10-CM

## 2020-05-29 ENCOUNTER — Ambulatory Visit (INDEPENDENT_AMBULATORY_CARE_PROVIDER_SITE_OTHER): Payer: Medicare PPO

## 2020-05-29 ENCOUNTER — Other Ambulatory Visit: Payer: Self-pay

## 2020-05-29 ENCOUNTER — Other Ambulatory Visit: Payer: Medicare PPO

## 2020-05-29 DIAGNOSIS — M542 Cervicalgia: Secondary | ICD-10-CM

## 2020-05-29 NOTE — Telephone Encounter (Signed)
I ordered Xrays of his cervical spine, so he can make an appt for these sometime soon

## 2020-05-30 ENCOUNTER — Encounter: Payer: Self-pay | Admitting: Family Medicine

## 2020-05-30 DIAGNOSIS — M542 Cervicalgia: Secondary | ICD-10-CM

## 2020-05-31 NOTE — Telephone Encounter (Signed)
The most effective treatment would probably be physical therapy. If he wishes, I can refer him to PT

## 2020-06-01 NOTE — Telephone Encounter (Signed)
Yes more than likely in the same clinic as before. I did the referral

## 2020-06-07 ENCOUNTER — Ambulatory Visit: Payer: Medicare PPO | Attending: Family Medicine | Admitting: Physical Therapy

## 2020-06-07 ENCOUNTER — Encounter: Payer: Self-pay | Admitting: Physical Therapy

## 2020-06-07 ENCOUNTER — Other Ambulatory Visit: Payer: Self-pay

## 2020-06-07 DIAGNOSIS — M5412 Radiculopathy, cervical region: Secondary | ICD-10-CM | POA: Insufficient documentation

## 2020-06-07 NOTE — Therapy (Signed)
[facility] [address], [place], [place], [address] Phone: [phone]   Fax:  [phone]  Physical Therapy Evaluation  Patient Details  Name: Bryan Smith MRN: [id number] Date of Birth: Jan 31, 1956 Referring Provider (PT): Dr. Alysia Penna   Encounter Date: 06/07/2020   PT End of Session - 06/07/20 1845    Visit Number 1    Number of Visits 12    Date for PT Re-Evaluation 08/02/20    Authorization Type [employer]    PT Start Time 1230    PT Stop Time 1316    PT Time Calculation (min) 46 min    Activity Tolerance Patient tolerated treatment well           Past Medical History:  Diagnosis Date  . Allergy   . ANEMIA-IRON DEFICIENCY 04/26/2007  . Diabetes mellitus without complication ([place])   . ELEVATED BP READING WITHOUT DX HYPERTENSION 04/26/2007  . GERD 01/04/2007  . LEUKOCYTOPENIA UNSPECIFIED 01/24/2009    Past Surgical History:  Procedure Laterality Date  . COLONOSCOPY  11/19/2016   per Dr. Fuller Plan, adenomatous polyps, repeat in 5 yrs     There were no vitals filed for this visit.    Subjective Assessment - 06/07/20 1236    Subjective Neck pain for 2 weeks. Thought it was from stress.  Central neck and right UE muscle spasm/twitching.  Sitting aggravates.  Looking up relieves temporarily.  Right UE symptoms but occassional tingle in left fingers.    Pertinent History doing less intense at the gym;  no pain while working out but returns when I get to the car;  Had PT at this facility for neck pain several years ago    Limitations House hold activities    How long can you sit comfortably? 15 minutes    How long can you walk comfortably? no issue    Diagnostic tests x-rays negative    Patient Stated Goals get rid of tingling and spasms so I can get back to tennis    Currently in Pain? Yes    Pain Score 6     Pain Location Neck    Pain Orientation Right    Pain Type Acute pain    Aggravating Factors   sitting    Pain Relieving Factors looking up; massage; portable TENS; has home traction (supine)              [facility] PT Assessment - 06/07/20 0001      Assessment   Medical Diagnosis cervicalgia    Referring Provider (PT) Dr. Alysia Penna    Onset Date/Surgical Date --   2 weeks   Next MD Visit for annual physical    Prior Therapy yes for neck UBE, traction, TENs;      Precautions   Precautions None      Restrictions   Weight Bearing Restrictions No      Balance Screen   Has the patient fallen in the past 6 months No    Has the patient had a decrease in activity level because of a fear of falling?  No    Is the patient reluctant to leave their home because of a fear of falling?  No      Home Ec[place] residence      Prior Function   Level of Independence Independent    Vocation Retired    Education officer, community; working out; enjoys tech things      Observation/Other  Assessments   Focus on Therapeutic Outcomes (FOTO)  64%      AROM   Overall AROM Comments centralization with cervical retraction and extensions; decreased cervical joint hypomobility    Cervical Flexion 40    Cervical Extension 63    Cervical - Right Side Bend 30    Cervical - Left Side Bend 40    Cervical - Right Rotation 60    Cervical - Left Rotation 50      Strength   Overall Strength Within functional limits for tasks performed      Special Tests   Other special tests negative upper limb tension test      Spurling's   Findings Negative      Distraction Test   Findngs Positive                      Objective measurements completed on examination: See above findings.               PT Education - 06/07/20 1354    Education Details seated cervical retraction and retraction/extension; centralization principle; basic info on DN    Person(s) Educated Patient    Methods Explanation;Demonstration;Handout    Comprehension Returned  demonstration;Verbalized understanding            PT Short Term Goals - 06/07/20 1903      PT SHORT TERM GOAL #1   Title The patient will demonstrate basic self care measures and exercises to decrease neck and UE symptoms    Time 4    Period Weeks    Status New    Target Date 07/05/20      PT SHORT TERM GOAL #2   Title The patient will report a 30% reduction in neck pain and right UE muscle spasm/twitches while sitting    Time 4    Period Weeks    Status New      PT SHORT TERM GOAL #3   Title Improved cervical right sidebending to 40 degrees and left rotation to 60 degrees needed for return to tennis    Time 4    Period Weeks    Status New             PT Long Term Goals - 06/07/20 1906      PT LONG TERM GOAL #1   Title The patient will be independent in a safe self progression of HEP    Time 8    Period Weeks    Status New    Target Date 08/02/20      PT LONG TERM GOAL #2   Title The patient will report a 60% reduction in neck and right UE symptoms with sitting    Time 8    Period Weeks    Status New      PT LONG TERM GOAL #3   Title The patient will have cervical and UE mobility and strength needed to return to tennis    Time 8    Period Weeks    Status New      PT LONG TERM GOAL #4   Title FOTO functional outcome score improved from 64% to 75%    Time 8    Period Weeks    Status New                  Plan - 06/07/20 1847    Clinical Impression Statement The patient reports a 2 week history of lower neck pain and  right upper arm muscle 'spasm and twitching".  He reports no specific mechanism of injury but feels anxiety and stress caused by multiple deaths of family members and friends over the last several months has contributed.  He does have a history of neck pain several years ago which resolved with PT at this facility.  He reports his symptoms are aggravated with sitting and better with movement, looking up and with using his home traction  and TENs unit.  His cervical ROM is WFLS although some asymmetry with sidebending and rotation.  Repeated movement testing indicates a possible preference for cervical retraction and extension. General cervical joint hypomobility.   Negative Spurlings and upper limb tesion test.  + distraction test.  After testing, the patient has visible lateral deltoid muscle fasciculations for 1-2 minutes before resolving.  He would benefit from PT to address these deficits and help him return to playiing tennis.    Personal Factors and Comorbidities Past/Current Experience;Comorbidity 1    Comorbidities previous weekend    Examination-Activity Limitations Lift;Other;Sit    Examination-Participation Restrictions Other    Stability/Clinical Decision Making Stable/Uncomplicated    Clinical Decision Making Low    Rehab Potential Good    PT Frequency 2x / week    PT Duration 8 weeks    PT Treatment/Interventions ADLs/Self Care Home Management;Cryotherapy;Electrical Stimulation;Iontophoresis 4mg /ml Dexamethasone;Moist Heat;Traction;Ultrasound;Neuromuscular re-education;Therapeutic exercise;Therapeutic activities;Patient/family education;Manual techniques;Dry needling;Taping;Spinal Manipulations    PT Next Visit Plan Trial of cervical retractions, retraction/extension; start UE neural gliding;  manual therapy cervical joint mobs;  may benefit from DN if neural symptoms ruled out    PT [place]    Recommended Other Services has supine home cervical traction and TENS unit    Consulted and Agree with Plan of Care Patient           Patient will benefit from skilled therapeutic intervention in order to improve the following deficits and impairments:  Pain,Decreased range of motion,Impaired UE functional use,Increased muscle spasms,Hypomobility  Visit Diagnosis: Radiculopathy, cervical region - Plan: PT plan of care cert/re-cert     Problem List Patient Active Problem List   Diagnosis Date  Noted  . Diabetes mellitus type 2 in obese ([place]) 01/17/2016  . Chronic neck pain 01/16/2015  . Palpitations 01/16/2015  . Anxiety state 01/16/2015  . LEUKOCYTOPENIA UNSPECIFIED 01/24/2009  . GASTROENTERITIS 12/26/2008  . SKIN TAG 11/29/2007  . ACUTE BRONCHITIS 07/20/2007  . NECK PAIN 06/25/2007  . ANEMIA-IRON DEFICIENCY 04/26/2007  . HTN (hypertension) 04/26/2007  . GERD 01/04/2007   Ruben Im, PT 06/07/20 7:15 PM Phone: [phone] Fax: [phone] Alvera Singh 06/07/2020, 7:14 PM  [facility] [address] [place], [place], [address] Phone: [phone]   Fax:  [phone]  Name: JAQUIN COY MRN: [id number] Date of Birth: 1955-06-19

## 2020-06-07 NOTE — Patient Instructions (Signed)
Access Code: [email or web address] URL: [email or web address] Date: 06/07/2020 Prepared by: Ruben Im  Exercises Seated Passive Cervical Retraction - 3-4 x daily - 7 x weekly - 1 sets - 10 reps Seated Cervical Retraction and Extension - 3-4 x daily - 7 x weekly - 1 sets - 10 reps

## 2020-06-13 ENCOUNTER — Ambulatory Visit: Payer: Medicare PPO | Attending: Family Medicine

## 2020-06-13 ENCOUNTER — Other Ambulatory Visit: Payer: Self-pay

## 2020-06-13 DIAGNOSIS — M5412 Radiculopathy, cervical region: Secondary | ICD-10-CM | POA: Insufficient documentation

## 2020-06-13 NOTE — Patient Instructions (Signed)
Access Code: [email or web address] URL: [email or web address] Date: 06/13/2020 Prepared by: Claiborne Billings   Seated Upper Trapezius Stretch - 3 x daily - 7 x weekly - 1 sets - 3 reps - 20 hold Supine Shoulder Horizontal Abduction with Resistance - 2 x daily - 7 x weekly - 10 reps - 2 sets Supine Bilateral Shoulder External Rotation with Resistance - 2 x daily - 7 x weekly - 10 reps - 2 sets Supine PNF D2 Flexion with Resistance - 2 x daily - 7 x weekly - 10 reps - 2 sets Cervical Rotation Prone on Elbows - 1 x daily - 7 x weekly - 3 sets - 10 reps

## 2020-06-13 NOTE — Therapy (Signed)
[facility] [address] [place], [place], [address] Phone: [phone]   Fax:  [phone]  Physical Therapy Treatment  Patient Details  Name: Bryan Smith MRN: [id number] Date of Birth: [birth year] Referring Provider (PT): Dr. Alysia Penna   Encounter Date: 06/13/2020   PT End of Session - 06/13/20 1527    Visit Number 2    Number of Visits [place] visits 06/07/20-07/30/20    Authorization - Visit Number 2    Authorization - Number of Visits 12    PT Start Time 7169    PT Stop Time 1528    PT Time Calculation (min) 42 min    Activity Tolerance Patient tolerated treatment well    Behavior During Therapy [facility] for tasks assessed/performed           Past Medical History:  Diagnosis Date  . Allergy   . ANEMIA-IRON DEFICIENCY 04/26/2007  . Diabetes mellitus without complication ([employer])   . ELEVATED BP READING WITHOUT DX HYPERTENSION 04/26/2007  . GERD 01/04/2007  . LEUKOCYTOPENIA UNSPECIFIED 01/24/2009    Past Surgical History:  Procedure Laterality Date  . COLONOSCOPY  11/19/2016   per Dr. Fuller Plan, adenomatous polyps, repeat in 5 yrs     There were no vitals filed for this visit.   Subjective Assessment - 06/13/20 1448    Subjective The tingling and spasms in my neck and arms have stopped for the most part.  I'm using my home traction unit every other day.    Currently in Pain? Yes    Pain Location Neck    Pain Orientation Left    Pain Descriptors / Indicators Burning    Pain Type Acute pain    Pain Onset More than a month ago    Pain Frequency Intermittent    Aggravating Factors  stress, sitting for too long    Pain Relieving Factors home traction, TENs unit                             [facility] Adult PT Treatment/Exercise - 06/13/20 0001      Exercises   Exercises Neck;Shoulder      Neck Exercises: Machines for Strengthening   UBE (Upper Arm Bike) Level  2.0 x6 minutes (3/3)- PT present to discuss progress      Neck Exercises: Seated   Neck Retraction 10 reps;5 secs    Neck Retraction Limitations retraction with extension x10      Neck Exercises: Supine   Neck Retraction 10 reps      Shoulder Exercises: Supine   Horizontal ABduction Strengthening;Both;20 reps;Theraband    Theraband Level (Shoulder Horizontal ABduction) Level 2 (Red)    External Rotation Strengthening;Both;10 reps    Theraband Level (Shoulder External Rotation) Level 2 (Red)    Diagonals Strengthening;Both;20 reps;Theraband    Theraband Level (Shoulder Diagonals) Level 2 (Red)      Shoulder Exercises: Prone   Other Prone Exercises quadrant rotation x10      Manual Therapy   Manual Therapy Joint mobilization;Soft tissue mobilization    Joint Mobilization sideglides, quadrant stretch with mobs at end range bilaterally      Neck Exercises: Stretches   Upper Trapezius Stretch 2 reps;Left;Right                  PT Education - 06/13/20 1507    Education Details Access Code: [email or web address]  Person(s) Educated Patient    Methods Explanation;Demonstration;Handout    Comprehension Verbalized understanding;Returned demonstration            PT Short Term Goals - 06/07/20 1903      PT SHORT TERM GOAL #1   Title The patient will demonstrate basic self care measures and exercises to decrease neck and UE symptoms    Time 4    Period Weeks    Status New    Target Date 07/05/20      PT SHORT TERM GOAL #2   Title The patient will report a 30% reduction in neck pain and right UE muscle spasm/twitches while sitting    Time 4    Period Weeks    Status New      PT SHORT TERM GOAL #3   Title Improved cervical right sidebending to 40 degrees and left rotation to 60 degrees needed for return to tennis    Time 4    Period Weeks    Status New             PT Long Term Goals - 06/07/20 1906      PT LONG TERM GOAL #1   Title The patient will be independent in  a safe self progression of HEP    Time 8    Period Weeks    Status New    Target Date 08/02/20      PT LONG TERM GOAL #2   Title The patient will report a 60% reduction in neck and right UE symptoms with sitting    Time 8    Period Weeks    Status New      PT LONG TERM GOAL #3   Title The patient will have cervical and UE mobility and strength needed to return to tennis    Time 8    Period Weeks    Status New      PT LONG TERM GOAL #4   Title FOTO functional outcome score improved from 64% to 75%    Time 8    Period Weeks    Status New                 Plan - 06/13/20 1529    Clinical Impression Statement First time follow-up after evaluation today. Pt reports 30-40% reduction in neck/upper trap pain since last session.  Pt has been doing initial HEP and using cervical traction.  PT added postural strength in supine and cervical flexibility stretches.  Pt tolerated all well without increased pain and pt required tactile cues for alignment and to reduce Upper trap activation.  Pt with reduced cervical mobility on the Rt with mobs and had good response to mobs with improved perception of flexibility by pt.  Pt will continue to benefit from skilled PT to address cervical pain and radiculopathy.    PT Treatment/Interventions ADLs/Self Care Home Management;Cryotherapy;Electrical Stimulation;Iontophoresis 4mg /ml Dexamethasone;Moist Heat;Traction;Ultrasound;Neuromuscular re-education;Therapeutic exercise;Therapeutic activities;Patient/family education;Manual techniques;Dry needling;Taping;Spinal Manipulations    PT Next Visit Plan continue mobs, review HEP, DN to upper traps    PT Home Exercise Plan [email or web address]    Consulted and Agree with Plan of Care Patient           Patient will benefit from skilled therapeutic intervention in order to improve the following deficits and impairments:  Pain,Decreased range of motion,Impaired UE functional use,Increased muscle  spasms,Hypomobility  Visit Diagnosis: Radiculopathy, cervical region     Problem List Patient Active Problem List   Diagnosis Date Noted  .  Diabetes mellitus type 2 in obese ([place]) 01/17/2016  . Chronic neck pain 01/16/2015  . Palpitations 01/16/2015  . Anxiety state 01/16/2015  . LEUKOCYTOPENIA UNSPECIFIED 01/24/2009  . GASTROENTERITIS 12/26/2008  . SKIN TAG 11/29/2007  . ACUTE BRONCHITIS 07/20/2007  . NECK PAIN 06/25/2007  . ANEMIA-IRON DEFICIENCY 04/26/2007  . HTN (hypertension) 04/26/2007  . GERD 01/04/2007     Bryan Smith, PT 06/13/20 3:31 PM  [facility] [address], [place], [place], [address] Phone: [phone]   Fax:  [phone]  Name: Bryan Smith MRN: [id number] Date of Birth: [birth year]

## 2020-06-19 ENCOUNTER — Ambulatory Visit: Payer: Medicare PPO

## 2020-06-19 ENCOUNTER — Other Ambulatory Visit: Payer: Self-pay

## 2020-06-19 DIAGNOSIS — M5412 Radiculopathy, cervical region: Secondary | ICD-10-CM | POA: Diagnosis not present

## 2020-06-19 NOTE — Therapy (Signed)
[facility] [address] [place], [place], [address] Phone: [phone]   Fax:  [phone]  Physical Therapy Treatment  Patient Details  Name: Bryan Smith MRN: [id number] Date of Birth: September 08, 1955 Referring Provider (Bryan Smith): Dr. Alysia Penna   Encounter Date: 06/19/2020   Bryan Smith End of Session - 06/19/20 1305    Visit Number 3    Date for Bryan Smith Re-Evaluation 08/02/20    Authorization Type [employer]- 12 visits 06/07/20-07/30/20    Authorization - Visit Number 3    Authorization - Number of Visits 12    Bryan Smith Start Time 1225    Bryan Smith Stop Time 1304    Bryan Smith Time Calculation (min) 39 min    Activity Tolerance Patient tolerated treatment well    Behavior During Therapy [facility] for tasks assessed/performed           Past Medical History:  Diagnosis Date  . Allergy   . ANEMIA-IRON DEFICIENCY 04/26/2007  . Diabetes mellitus without complication ([place])   . ELEVATED BP READING WITHOUT DX HYPERTENSION 04/26/2007  . GERD 01/04/2007  . LEUKOCYTOPENIA UNSPECIFIED 01/24/2009    Past Surgical History:  Procedure Laterality Date  . COLONOSCOPY  11/19/2016   per Dr. Fuller Plan, adenomatous polyps, repeat in 5 yrs     There were no vitals filed for this visit.   Subjective Assessment - 06/19/20 1226    Subjective The arm pain is gone.  I had a little bit of pain in the base of my neck up to 3/10.  I want to try some hitting on the tennis court this week.    Currently in Pain? No/denies   up to 6/10 for 3-4/10- just with sitting   Pain Location Neck    Pain Orientation Left    Pain Descriptors / Indicators Burning    Pain Onset More than a month ago    Pain Frequency Intermittent    Aggravating Factors  stress, sitting for too long    Pain Relieving Factors home traction, TENs unit              [facility] Bryan Smith Assessment - 06/19/20 0001      AROM   Cervical - Right Side Bend 40    Cervical - Left Side Bend 40    Cervical - Right Rotation 65     Cervical - Left Rotation 60                         [facility] Adult Bryan Smith Treatment/Exercise - 06/19/20 0001      Exercises   Exercises Neck;Shoulder      Neck Exercises: Machines for Strengthening   UBE (Upper Arm Bike) Level 2.0 x6 minutes (3/3)- Bryan Smith present to discuss progress      Neck Exercises: Seated   Neck Retraction 10 reps;5 secs      Shoulder Exercises: Supine   Horizontal ABduction Strengthening;Both;20 reps;Theraband    Theraband Level (Shoulder Horizontal ABduction) Level 2 (Red)    External Rotation Strengthening;Both;10 reps    Theraband Level (Shoulder External Rotation) Level 2 (Red)    Diagonals Strengthening;Both;20 reps;Theraband    Theraband Level (Shoulder Diagonals) Level 2 (Red)      Shoulder Exercises: Prone   Other Prone Exercises quadrant rotation x10      Manual Therapy   Manual Therapy Joint mobilization;Soft tissue mobilization    Manual therapy comments elongation to bil upper traps and cervical paraspinals    Joint Mobilization  sideglides, quadrant stretch with mobs at end range bilaterally                    Bryan Smith Short Term Goals - 06/19/20 1228      Bryan Smith SHORT TERM GOAL #1   Title The patient will demonstrate basic self care measures and exercises to decrease neck and UE symptoms    Status On-going      Bryan Smith SHORT TERM GOAL #2   Title The patient will report a 30% reduction in neck pain and right UE muscle spasm/twitches while sitting    Baseline 80%    Status Achieved             Bryan Smith Long Term Goals - 06/07/20 1906      Bryan Smith LONG TERM GOAL #1   Title The patient will be independent in a safe self progression of HEP    Time 8    Period Weeks    Status New    Target Date 08/02/20      Bryan Smith LONG TERM GOAL #2   Title The patient will report a 60% reduction in neck and right UE symptoms with sitting    Time 8    Period Weeks    Status New      Bryan Smith LONG TERM GOAL #3   Title The patient will have cervical and UE  mobility and strength needed to return to tennis    Time 8    Period Weeks    Status New      Bryan Smith LONG TERM GOAL #4   Title FOTO functional outcome score improved from 64% to 75%    Time 8    Period Weeks    Status New                 Plan - 06/19/20 1306    Clinical Impression Statement Bryan Smith continues to report that his arm pain has resolved.  Bryan Smith with continued pain at the base of the neck and rates this pain as 6/10 max pain.  Bryan Smith has been doing  HEP and using cervical traction.  Bryan Smith demonstrated supine theraband exercises with good form.  Bryan Smith with reduced cervical mobility on the Rt with mobs and had good response to mobs with improved perception of flexibility by Bryan Smith. Bryan Smith has improvements in cervical sidebending and rotation.  Bryan Smith will continue to benefit from skilled Bryan Smith to address cervical pain and radiculopathy.    Rehab Potential Good    Bryan Smith Frequency 2x / week    Bryan Smith Duration 8 weeks    Bryan Smith Treatment/Interventions ADLs/Self Care Home Management;Cryotherapy;Electrical Stimulation;Iontophoresis 4mg /ml Dexamethasone;Moist Heat;Traction;Ultrasound;Neuromuscular re-education;Therapeutic exercise;Therapeutic activities;Patient/family education;Manual techniques;Dry needling;Taping;Spinal Manipulations    Bryan Smith Next Visit Plan continue mobs,  DN to upper traps if needed, postural strength, cervical/thoracic mobility    Bryan Smith Home Exercise Plan [email or web address]    Consulted and Agree with Plan of Care Patient           Patient will benefit from skilled therapeutic intervention in order to improve the following deficits and impairments:  Pain,Decreased range of motion,Impaired UE functional use,Increased muscle spasms,Hypomobility  Visit Diagnosis: Radiculopathy, cervical region     Problem List Patient Active Problem List   Diagnosis Date Noted  . Diabetes mellitus type 2 in obese ([place]) 01/17/2016  . Chronic neck pain 01/16/2015  . Palpitations 01/16/2015  . Anxiety state 01/16/2015  .  LEUKOCYTOPENIA UNSPECIFIED 01/24/2009  . GASTROENTERITIS 12/26/2008  . SKIN TAG 11/29/2007  . ACUTE  BRONCHITIS 07/20/2007  . NECK PAIN 06/25/2007  . ANEMIA-IRON DEFICIENCY 04/26/2007  . HTN (hypertension) 04/26/2007  . GERD 01/04/2007    Bryan Smith, Bryan Smith 06/19/20 1:08 PM  [facility] [address], [place], [place], [address] Phone: [phone]   Fax:  [phone]  Name: Bryan Smith MRN: [id number] Date of Birth: Jul 03, 1955

## 2020-06-22 ENCOUNTER — Encounter: Payer: Self-pay | Admitting: Family Medicine

## 2020-06-25 NOTE — Telephone Encounter (Signed)
Copy of Advance Directive notarized copy of pt healthcare POA was received  printed and given to Ms Joycelyn Schmid front office for filing. Pt notified

## 2020-06-27 ENCOUNTER — Ambulatory Visit (INDEPENDENT_AMBULATORY_CARE_PROVIDER_SITE_OTHER): Payer: Medicare PPO | Admitting: Family Medicine

## 2020-06-27 ENCOUNTER — Other Ambulatory Visit: Payer: Self-pay

## 2020-06-27 ENCOUNTER — Encounter: Payer: Self-pay | Admitting: Family Medicine

## 2020-06-27 VITALS — BP 126/84 | HR 87 | Temp 98.0°F | Ht 71.0 in | Wt 225.0 lb

## 2020-06-27 DIAGNOSIS — D509 Iron deficiency anemia, unspecified: Secondary | ICD-10-CM

## 2020-06-27 DIAGNOSIS — D709 Neutropenia, unspecified: Secondary | ICD-10-CM | POA: Diagnosis not present

## 2020-06-27 DIAGNOSIS — E669 Obesity, unspecified: Secondary | ICD-10-CM | POA: Diagnosis not present

## 2020-06-27 DIAGNOSIS — Z0000 Encounter for general adult medical examination without abnormal findings: Secondary | ICD-10-CM

## 2020-06-27 DIAGNOSIS — Z23 Encounter for immunization: Secondary | ICD-10-CM | POA: Diagnosis not present

## 2020-06-27 DIAGNOSIS — E1169 Type 2 diabetes mellitus with other specified complication: Secondary | ICD-10-CM

## 2020-06-27 LAB — LIPID PANEL
Cholesterol: 163 mg/dL (ref 0–200)
HDL: 41.5 mg/dL (ref >39.00)
LDL Cholesterol: 96 mg/dL (ref 0–99)
NonHDL: 121.75
Total CHOL/HDL Ratio: 4
Triglycerides: 127 mg/dL (ref 0.0–149.0)
VLDL: 25.4 mg/dL (ref 0.0–40.0)

## 2020-06-27 LAB — BASIC METABOLIC PANEL
BUN: 13 mg/dL (ref 6–23)
CO2: 26 mEq/L (ref 19–32)
Calcium: 9 mg/dL (ref 8.4–10.5)
Chloride: 106 mEq/L (ref 96–112)
Creatinine, Ser: 1.2 mg/dL (ref 0.40–1.50)
GFR: 62.69 mL/min (ref >60.00)
Glucose, Bld: 112 mg/dL — ABNORMAL HIGH (ref 70–99)
Potassium: 4.7 mEq/L (ref 3.5–5.1)
Sodium: 140 mEq/L (ref 135–145)

## 2020-06-27 LAB — HEPATIC FUNCTION PANEL
ALT: 15 U/L (ref 0–53)
AST: 14 U/L (ref 0–37)
Albumin: 3.6 g/dL (ref 3.5–5.2)
Alkaline Phosphatase: 66 U/L (ref 39–117)
Bilirubin, Direct: 0.1 mg/dL (ref 0.0–0.3)
Total Bilirubin: 0.4 mg/dL (ref 0.2–1.2)
Total Protein: 5.9 g/dL — ABNORMAL LOW (ref 6.0–8.3)

## 2020-06-27 LAB — CBC WITH DIFFERENTIAL/PLATELET
Basophils Absolute: 0 10*3/uL (ref 0.0–0.1)
Basophils Relative: 1.1 % (ref 0.0–3.0)
Eosinophils Absolute: 0.1 10*3/uL (ref 0.0–0.7)
Eosinophils Relative: 2.2 % (ref 0.0–5.0)
HCT: 38.4 % — ABNORMAL LOW (ref 39.0–52.0)
Hemoglobin: 11.9 g/dL — ABNORMAL LOW (ref 13.0–17.0)
Lymphocytes Relative: 26.7 % (ref 12.0–46.0)
Lymphs Abs: 0.7 10*3/uL (ref 0.7–4.0)
MCHC: 31 g/dL (ref 30.0–36.0)
MCV: 69.5 fl — ABNORMAL LOW (ref 78.0–100.0)
Monocytes Absolute: 0.5 10*3/uL (ref 0.1–1.0)
Monocytes Relative: 19.6 % — ABNORMAL HIGH (ref 3.0–12.0)
Neutro Abs: 1.3 10*3/uL — ABNORMAL LOW (ref 1.4–7.7)
Neutrophils Relative %: 50.4 % (ref 43.0–77.0)
Platelets: 297 10*3/uL (ref 150.0–400.0)
RBC: 5.53 Mil/uL (ref 4.22–5.81)
RDW: 21.6 % — ABNORMAL HIGH (ref 11.5–15.5)
WBC: 2.5 10*3/uL — ABNORMAL LOW (ref 4.0–10.5)

## 2020-06-27 LAB — TSH: TSH: 2.06 u[IU]/mL (ref 0.35–4.50)

## 2020-06-27 LAB — HEMOGLOBIN A1C: Hgb A1c MFr Bld: 6.8 % — ABNORMAL HIGH (ref 4.6–6.5)

## 2020-06-27 LAB — PSA: PSA: 2.03 ng/mL (ref 0.10–4.00)

## 2020-06-27 NOTE — Addendum Note (Signed)
Addended by: Janann Colonel on: 06/27/2020 09:38 AM   Modules accepted: Orders

## 2020-06-27 NOTE — Addendum Note (Signed)
Addended by: Wyvonne Lenz on: 06/27/2020 09:47 AM   Modules accepted: Orders

## 2020-06-27 NOTE — Progress Notes (Signed)
Subjective:    Patient ID: Bryan Smith, male    DOB: May 25, 1955, 65 y.o.   MRN: [id number]  HPI Here for a well exam. He feels well. He was here last month for neck pain, but he is responding well to PT. We sent him to Dr. Lovena Neighbours (Urology) last year for an elevated PSA to 6.14 and there was some discussion about a prostate biopsy. However Dr. Lovena Neighbours felt the elevation may have been the result of irritation from an indwelling catheter. They simply rechecked the PSA in Novemeber, and apparently it was back to normal (I do not have that office note). He still works out 3 days a week.    Review of Systems  Constitutional: Negative.   HENT: Negative.   Eyes: Negative.   Respiratory: Negative.   Cardiovascular: Negative.   Gastrointestinal: Negative.   Genitourinary: Negative.   Musculoskeletal: Negative.   Skin: Negative.   Neurological: Negative.   Psychiatric/Behavioral: Negative.        Objective:   Physical Exam Constitutional:      General: He is not in acute distress.    Appearance: He is well-developed. He is obese. He is not diaphoretic.  HENT:     Head: Normocephalic and atraumatic.     Right Ear: External ear normal.     Left Ear: External ear normal.     Nose: Nose normal.     Mouth/Throat:     Pharynx: No oropharyngeal exudate.  Eyes:     General: No scleral icterus.       Right eye: No discharge.        Left eye: No discharge.     Conjunctiva/sclera: Conjunctivae normal.     Pupils: Pupils are equal, round, and reactive to light.  Neck:     Thyroid: No thyromegaly.     Vascular: No JVD.     Trachea: No tracheal deviation.  Cardiovascular:     Rate and Rhythm: Normal rate and regular rhythm.     Heart sounds: Normal heart sounds. No murmur heard. No friction rub. No gallop.   Pulmonary:     Effort: Pulmonary effort is normal. No respiratory distress.     Breath sounds: Normal breath sounds. No wheezing or rales.  Chest:     Chest wall: No  tenderness.  Abdominal:     General: Bowel sounds are normal. There is no distension.     Palpations: Abdomen is soft. There is no mass.     Tenderness: There is no abdominal tenderness. There is no guarding or rebound.  Genitourinary:    Penis: No tenderness.   Musculoskeletal:        General: No tenderness. Normal range of motion.     Cervical back: Neck supple.  Lymphadenopathy:     Cervical: No cervical adenopathy.  Skin:    General: Skin is warm and dry.     Coloration: Skin is not pale.     Findings: No erythema or rash.  Neurological:     Mental Status: He is alert and oriented to person, place, and time.     Cranial Nerves: No cranial nerve deficit.     Motor: No abnormal muscle tone.     Coordination: Coordination normal.     Deep Tendon Reflexes: Reflexes are normal and symmetric. Reflexes normal.  Psychiatric:        Behavior: Behavior normal.        Thought Content: Thought content normal.  Judgment: Judgment normal.           Assessment & Plan:  Well exam. We discussed diet and exercise. Get fasting labs, including a PSA. He will see Dr. Lovena Neighbours again next month for a prostate exam.  Alysia Penna, MD

## 2020-06-28 ENCOUNTER — Telehealth: Payer: Self-pay | Admitting: Physician Assistant

## 2020-06-28 NOTE — Addendum Note (Signed)
Addended by: Alysia Penna A on: 06/28/2020 07:51 AM   Modules accepted: Orders

## 2020-06-28 NOTE — Telephone Encounter (Signed)
Received a new hem referral from Dr. Sarajane Jews for IDA/Neutropenia. Bryan Smith returned my call and has been scheduled to see [name] on 4/27 at 1:30pm w/labs at 1pm. Pt aware to arrive 20 minutes early.

## 2020-07-03 ENCOUNTER — Ambulatory Visit: Payer: Medicare PPO

## 2020-07-03 ENCOUNTER — Other Ambulatory Visit: Payer: Self-pay

## 2020-07-03 DIAGNOSIS — M5412 Radiculopathy, cervical region: Secondary | ICD-10-CM | POA: Diagnosis not present

## 2020-07-03 NOTE — Progress Notes (Signed)
[place] Telephone:([phone]   Fax:([phone]) [phone]  CONSULT NOTE  REFERRING PHYSICIAN: Dr. Sarajane Jews  REASON FOR CONSULTATION:  Leukocytopenia/Anemia   HPI Bryan Smith is a 65 y.o. male with a past medical history significant for hypertension, diabetes, GERD, and iron deficiency anemia is referred to the clinic for evaluation neutropenia and anemia.    The patient was seen by his primary care provider on 06/27/2020 for a wellness visit.  He had lab work performed that day which showed leukocytopenia with a total white blood cell count low at 2.5.  His absolute neutrophil count was low at 1.3.  His monocyte count was elevated.  The patient's hemoglobin was slightly low at 11.9.  His MCV was low at 69.5.  His platelet count was within normal limits.  Per chart review, the oldest records available to me are from 2008.  It appears that the patient has had persistent leukocytopenia since that time.  The patient is also had a few episodes of mild microcytic anemia with the lowest hemoglobin of 11.9.  The patient was referred to the clinic for evaluation and recommendations regarding these findings.  The patient was actually seen in the clinic in the past by Dr. Truddie Coco. He estimates this was bout 12 years ago, although the records are not available to me. He states he was placed on high dose iron and his labs improved.   Per patient report, he has had mild anemia his been on and off since college. His last colonoscopy was performed in 2018 under the care of Dr. Fuller Plan from [facility] which showed 3 small polyps, some diverticula, and small internal hemorrhoids. The pathology was consistent with tubular adenoma and hyperplastic polyp. He is due for a repeat colonoscopy next year.   Of note, the patient donates blood every 10 weeks or so. He also had been taking a multivitamin daily but stopped back in December 2021. He resumed taking this earlier this week. He denies other known  vitamin deficiencies.    Regarding anemia, he denies needing a blood transfusion or an iron infusion.  Overall, the patient is asymptomatic. Denies significant fatigue.He denies any shortness of breath with exertion, lightheadedness, or palpitations. The patient denies any abnormal bleeding or bruising including epistaxis, gingival bleeding, hemoptysis, hematemesis, melena, or hematuria. He does not take blood thinners or aspirin. He rarely takes NSAIDs. He estimates he eats red meat about 1x per week. He does not eat a lot of iron rich food due to patient preference. He denies any diarrhea, constipation, or abdominal pain. He denies nausea or vomiting recently but he takes phenergan if he has nausea/vomiting. He deniesfrequent infections. He denies any history of any other viral infections such as hepatitis, HIV, or mono. He denies night sweats or unexplained weight loss.  He denies fevers, or chills. He denies history of autoimmune disorders. He does not take herbal supplements.   The patient's mother passed away at the age of 85 due to colorectal cancer.  The patient's father passed away due to failure to thrive and old age at the age of 71.  The patient has 2 sisters that are anemic.   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  He is not married does not have any children.  He denies any drug or tobacco use.  The patient estimates that he drinks 1 glass of alcohol every 4 weeks.   HPI  Past Medical History:  Diagnosis Date  .  Allergy   . ANEMIA-IRON DEFICIENCY 04/26/2007  . Diabetes mellitus without complication ([place])   . GERD 01/04/2007  . Hypertension   . LEUKOCYTOPENIA UNSPECIFIED 01/24/2009    Past Surgical History:  Procedure Laterality Date  . COLONOSCOPY  11/19/2016   per Dr. Fuller Plan, adenomatous polyps, repeat in 5 yrs     Family History  Problem Relation Age of Onset  . Arthritis Mother   . Colon cancer Mother     Social  History Social History   Tobacco Use  . Smoking status: Never Smoker  . Smokeless tobacco: Never Used  Substance Use Topics  . Alcohol use: No    Alcohol/week: 0.0 standard drinks  . Drug use: No    Allergies  Allergen Reactions  . Lisinopril     cough    Current Outpatient Medications  Medication Sig Dispense Refill  . esomeprazole (NEXIUM) 40 MG capsule Take 1 capsule (40 mg total) by mouth daily. 30 capsule 12  . losartan (COZAAR) 50 MG tablet TAKE 1 TABLET BY MOUTH EVERY DAY 90 tablet 1  . Multiple Vitamins-Minerals (CENTRUM SILVER PO) Take by mouth as needed.    . promethazine (PHENERGAN) 25 MG tablet Take 1 tablet (25 mg total) by mouth every 6 (six) hours as needed for nausea or vomiting. 20 tablet 2  . tamsulosin (FLOMAX) 0.4 MG CAPS capsule Take 1 capsule (0.4 mg total) by mouth daily. 30 capsule 3   Current Facility-Administered Medications  Medication Dose Route Frequency Provider Last Rate Last Admin  . 0.9 %  sodium chloride infusion  500 mL Intravenous Continuous Ladene Artist, MD        REVIEW OF SYSTEMS:   Review of Systems  Constitutional: Negative for appetite change, chills, fatigue, fever and unexpected weight change.  HENT: Negative for mouth sores, nosebleeds, sore throat and trouble swallowing.   Eyes: Negative for eye problems and icterus.  Respiratory: Negative for cough, hemoptysis, shortness of breath and wheezing.   Cardiovascular: Negative for chest pain and leg swelling.  Gastrointestinal: Negative for abdominal pain, constipation, diarrhea, nausea and vomiting.  Genitourinary: Negative for bladder incontinence, difficulty urinating, dysuria, frequency and hematuria.   Musculoskeletal: Negative for back pain, gait problem, neck pain and neck stiffness.  Skin: Negative for itching and rash.  Neurological: Negative for dizziness, extremity weakness, gait problem, headaches, light-headedness and seizures.  Hematological: Negative for  adenopathy. Does not bruise/bleed easily.  Psychiatric/Behavioral: Negative for confusion, depression and sleep disturbance. The patient is not nervous/anxious.     PHYSICAL EXAMINATION:  There were no vitals taken for this visit.  ECOG PERFORMANCE STATUS: 1 - Symptomatic but completely ambulatory  Physical Exam  Constitutional: Oriented to person, place, and time and well-developed, well-nourished, and in no distress.  HENT:  Head: Normocephalic and atraumatic.  Mouth/Throat: Oropharynx is clear and moist. No oropharyngeal exudate.  Eyes: Conjunctivae are normal. Right eye exhibits no discharge. Left eye exhibits no discharge. No scleral icterus.  Neck: Normal range of motion. Neck supple.  Cardiovascular: Normal rate, regular rhythm, normal heart sounds and intact distal pulses.   Pulmonary/Chest: Effort normal and breath sounds normal. No respiratory distress. No wheezes. No rales.  Abdominal: Soft. Bowel sounds are normal. Exhibits no distension and no mass. There is no tenderness.  Musculoskeletal: Normal range of motion. Exhibits no edema.  Lymphadenopathy:    No cervical adenopathy.  Neurological: Alert and oriented to person, place, and time. Exhibits normal muscle tone. Gait normal. Coordination normal.  Skin: Skin is  warm and dry. No rash noted. Not diaphoretic. No erythema. No pallor.  Psychiatric: Mood, memory and judgment normal.  Vitals reviewed.  LABORATORY DATA: Lab Results  Component Value Date   WBC 2.5 (L) 06/27/2020   HGB 11.9 (L) 06/27/2020   HCT 38.4 (L) 06/27/2020   MCV 69.5 (L) 06/27/2020   PLT 297.0 06/27/2020      Chemistry      Component Value Date/Time   NA 140 06/27/2020 0931   K 4.7 06/27/2020 0931   CL 106 06/27/2020 0931   CO2 26 06/27/2020 0931   BUN 13 06/27/2020 0931   CREATININE 1.20 06/27/2020 0931      Component Value Date/Time   CALCIUM 9.0 06/27/2020 0931   ALKPHOS 66 06/27/2020 0931   AST 14 06/27/2020 0931   ALT 15  06/27/2020 0931   BILITOT 0.4 06/27/2020 0931       RADIOGRAPHIC STUDIES: No results found.  ASSESSMENT: This is a very pleasant 65 year old African-American male referred to the clinic for evaluation of anemia and leukocytopenia   PLAN: The patient was seen with Dr. Julien Nordmann today.  The patient had several lab studies performed including CBC, CMP, LDH, hepatitis panel, ANA, rheumatoid factor, HIV testing, iron studies, ferritin, vitamin B12, and folate testing.  The patient's CBC shows a low end of normal WBC count at 4.1 and normal neutrophil count at 2.2.  The patient shows slight anemia with a hemoglobin of 11.8 and an MCV low at 73.  His CMP and LDH are unremarkable except for a slightly elevated creatinine. His ferritin is low at 6 and his iron saturation is low at 11%. His TIBC is slightly elevated and total iron is on the low end of normal at 45. His other lab studies are pending at this time.  The anemia is likely secondary to frequent blood donation which is causing iron deficiency. Recommend he start taking iron supplements. He can take prescription or oral iron tablets. He is going to try oral iron supplements but if he decides he wants a prescription, he stated he will call us back. He was encouraged to take iron with vitamin C to help with absorption. He was also given a handout on iron rich food. Dr. Julien Nordmann believes that his leukocytopenia was likely reactive but we will call the patient if there are any abnormalities in his pending lab studies.   The patient is up-to-date on his colonoscopy but we will arrange for the patient to have stool cards performed to rule out any GI blood loss given his anemia and family history of CRC.    If the results are negative, no further work-up is necessary.  If the stool cards show evidence of GI blood loss, would recommend the patient follow-up with his gastroenterologist.  We will arrange for a follow up in 2-3 months for repeat CBC, iron  studies, and ferritin.   The patient voices understanding of current disease status and treatment options and is in agreement with the current care plan.  All questions were answered. The patient knows to call the clinic with any problems, questions or concerns. We can certainly see the patient much sooner if necessary.  Thank you so much for allowing me to participate in the care of Bryan Smith. I will continue to follow up the patient with you and assist in his care.   Disclaimer: This note was dictated with voice recognition software. Similar sounding words can inadvertently be transcribed and may not be corrected  upon review.   Jnae Thomaston L Yu Peggs July 03, 2020, 9:02 PM  ADDENDUM: Hematology/Oncology Attending: I had a face-to-face encounter with the patient today.  I reviewed his record, labs and recommended his care plan.  This is a very pleasant 65 years old African-American male who was referred to [place] today for evaluation of iron deficiency anemia as well as leukocytopenia.  The patient has a history of diabetes mellitus, GERD and hypertension.  He mentions that he has a known history of anemia for many years and was seen by Dr. Truddie Coco at the cancer center more than 10 years ago for similar problem.  He was treated with IV iron infusion at that time. The patient was seen recently by his primary care physician and during his blood work he was noted to have leukocytopenia with total white blood count of 2.5.  He also had anemia with hemoglobin of 11.9 and MCV of 69.5% and platelets count were normal. His previous gastrointestinal blood work included colonoscopy in 2018 that was unremarkable except for 3 small polyps as well as small internal hemorrhoids. We order several studies today on this patient including repeat CBC which showed normal white blood count of 4.1.  He has persistent anemia with hemoglobin of 11.8 and hematocrit 39.8% with MCV of 73.0.  His ferritin level is  low at 6.  Serum iron was 45 and iron saturation 11%.  His serum folate and vitamin B12 level were normal. I recommended for the patient to continue on the oral iron tablet for now.  He did not have any intolerance to the iron supplements. He was also encouraged to increase the iron rich diet. Will check his stool for Hemoccult and if positive will refer him to gastroenterology. We will see the patient back for follow-up visit in 2-3 months with repeat CBC, iron study and ferritin and if no improvement in his ferritin level or hemoglobin or hematocrit, will consider The patient for iron infusion. The patient agreed to the current plan. He was advised to call immediately if he has any concerning symptoms in the interval.  The total time spent in the appointment was 60 minutes. Disclaimer: This note was dictated with voice recognition software. Similar sounding words can inadvertently be transcribed and may be missed upon review. Eilleen Kempf, MD 07/04/20

## 2020-07-03 NOTE — Therapy (Signed)
Gastroenterology [facility] [address] [place], [place], [address] Phone: [phone]   Fax:  [phone]  Physical Therapy Treatment  Patient Details  Name: Bryan Smith MRN: [id number] Date of Birth: 02-06-1956 Referring Provider (PT): Dr. Alysia Penna   Encounter Date: 07/03/2020   PT End of Session - 07/03/20 1057    Visit Number 4    Date for PT Re-Evaluation 08/02/20    Authorization Type [employer]- 12 visits 06/07/20-07/30/20    Authorization - Visit Number 4    Authorization - Number of Visits 12    PT Start Time 1016    PT Stop Time 1057    PT Time Calculation (min) 41 min    Activity Tolerance Patient tolerated treatment well    Behavior During Therapy [facility] for tasks assessed/performed           Past Medical History:  Diagnosis Date  . Allergy   . ANEMIA-IRON DEFICIENCY 04/26/2007  . Diabetes mellitus without complication ([place])   . GERD 01/04/2007  . Hypertension   . LEUKOCYTOPENIA UNSPECIFIED 01/24/2009    Past Surgical History:  Procedure Laterality Date  . COLONOSCOPY  11/19/2016   per Dr. Fuller Plan, adenomatous polyps, repeat in 5 yrs     There were no vitals filed for this visit.   Subjective Assessment - 07/03/20 1017    Subjective I was doing really well and then I went to a track meet and sat too long.  I had some neck and arm pain after this.  It lasted a few hours and was gone the next day.  I haven't tried playing tennis yet.  I feel 90% better.    Currently in Pain? No/denies    Pain Score --   after sitting too long 6/10- brief                            [facility] Adult PT Treatment/Exercise - 07/03/20 0001      Neck Exercises: Machines for Strengthening   UBE (Upper Arm Bike) Level 2.0 x6 minutes (3/3)- PT present to discuss progress      Neck Exercises: Seated   Neck Retraction 10 reps;5 secs      Shoulder Exercises: Seated   Horizontal ABduction Strengthening;Both;20  reps;Theraband    Theraband Level (Shoulder Horizontal ABduction) Level 2 (Red)    External Rotation Strengthening;Both;20 reps;Theraband    Theraband Level (Shoulder External Rotation) Level 2 (Red)    Diagonals Strengthening;Both;20 reps;Theraband    Theraband Level (Shoulder Diagonals) Level 2 (Red)    Other Seated Exercises 3 way raises 2#: 2x10      Manual Therapy   Manual Therapy Joint mobilization;Soft tissue mobilization    Manual therapy comments elongation to bil upper traps and cervical paraspinals    Joint Mobilization sideglides, quadrant stretch with mobs at end range bilaterally      Neck Exercises: Stretches   Upper Trapezius Stretch 2 reps;Left;Right                    PT Short Term Goals - 07/03/20 1020      PT SHORT TERM GOAL #1   Title The patient will demonstrate basic self care measures and exercises to decrease neck and UE symptoms    Status Achieved      PT SHORT TERM GOAL #2   Title The patient will report a 30% reduction in neck pain and right UE muscle  spasm/twitches while sitting    Status Achieved      PT SHORT TERM GOAL #3   Title Improved cervical right sidebending to 40 degrees and left rotation to 60 degrees needed for return to tennis    Status On-going             PT Long Term Goals - 07/03/20 1020      PT LONG TERM GOAL #1   Title The patient will be independent in a safe self progression of HEP    Status On-going      PT LONG TERM GOAL #2   Title The patient will report a 60% reduction in neck and right UE symptoms with sitting    Baseline 90% overall improvement but increased pain with sitting    Status On-going      PT LONG TERM GOAL #3   Title The patient will have cervical and UE mobility and strength needed to return to tennis    Status On-going                 Plan - 07/03/20 1030    Clinical Impression Statement Pt continues to report that his arm pain has resolved.  Pt reports that he had 6/10 neck  and some Rt UE radicular pain after sitting for a long period of time.  Pain resolved quickly and has not returned.  Pt performed all theraband exercises in sitting today vs supine and did so with good form.  Pt with reduced cervical mobility on the Rt with mobs and had good response to mobs with reduced tension and improved tissue mobility.  Pt reports 90% overall reduction in cervical and Rt UE symptoms and continues to have intermittent pain when sitting long periods. Pt will continue to benefit from skilled PT to address cervical pain and radiculopathy.    Rehab Potential Good    PT Frequency 2x / week    PT Duration 8 weeks    PT Treatment/Interventions ADLs/Self Care Home Management;Cryotherapy;Electrical Stimulation;Iontophoresis 4mg /ml Dexamethasone;Moist Heat;Traction;Ultrasound;Neuromuscular re-education;Therapeutic exercise;Therapeutic activities;Patient/family education;Manual techniques;Dry needling;Taping;Spinal Manipulations    PT Next Visit Plan continue mobs,  DN to upper traps if needed, postural strength, cervical/thoracic mobility    PT Home Exercise Plan [email or web address]    Consulted and Agree with Plan of Care Patient           Patient will benefit from skilled therapeutic intervention in order to improve the following deficits and impairments:  Pain,Decreased range of motion,Impaired UE functional use,Increased muscle spasms,Hypomobility  Visit Diagnosis: Radiculopathy, cervical region     Problem List Patient Active Problem List   Diagnosis Date Noted  . Diabetes mellitus type 2 in obese ([place]) 01/17/2016  . Chronic neck pain 01/16/2015  . Palpitations 01/16/2015  . Anxiety state 01/16/2015  . Leukocytopenia 01/24/2009  . SKIN TAG 11/29/2007  . ANEMIA-IRON DEFICIENCY 04/26/2007  . HTN (hypertension) 04/26/2007  . GERD 01/04/2007     Sigurd Sos, PT 07/03/20 11:01 AM  [facility] [address] [place], [place], [address] Phone: [phone]   Fax:  [phone]  Name: Bryan Smith MRN: [id number] Date of Birth: 23-Apr-1955

## 2020-07-04 ENCOUNTER — Inpatient Hospital Stay: Payer: Medicare PPO

## 2020-07-04 ENCOUNTER — Other Ambulatory Visit: Payer: Self-pay | Admitting: Physician Assistant

## 2020-07-04 ENCOUNTER — Inpatient Hospital Stay: Payer: Medicare PPO | Attending: Physician Assistant | Admitting: Physician Assistant

## 2020-07-04 VITALS — BP 149/85 | HR 110 | Temp 97.1°F | Resp 18 | Wt 225.3 lb

## 2020-07-04 DIAGNOSIS — Z8719 Personal history of other diseases of the digestive system: Secondary | ICD-10-CM | POA: Diagnosis not present

## 2020-07-04 DIAGNOSIS — D509 Iron deficiency anemia, unspecified: Secondary | ICD-10-CM | POA: Insufficient documentation

## 2020-07-04 DIAGNOSIS — D709 Neutropenia, unspecified: Secondary | ICD-10-CM | POA: Insufficient documentation

## 2020-07-04 DIAGNOSIS — Z800 Family history of malignant neoplasm of digestive organs: Secondary | ICD-10-CM | POA: Insufficient documentation

## 2020-07-04 DIAGNOSIS — R7989 Other specified abnormal findings of blood chemistry: Secondary | ICD-10-CM | POA: Diagnosis not present

## 2020-07-04 DIAGNOSIS — D72819 Decreased white blood cell count, unspecified: Secondary | ICD-10-CM | POA: Diagnosis not present

## 2020-07-04 LAB — CMP (CANCER CENTER ONLY)
ALT: 16 U/L (ref 0–44)
AST: 20 U/L (ref 15–41)
Albumin: 3.3 g/dL — ABNORMAL LOW (ref 3.5–5.0)
Alkaline Phosphatase: 77 U/L (ref 38–126)
Anion gap: 8 (ref 5–15)
BUN: 11 mg/dL (ref 8–23)
CO2: 26 mmol/L (ref 22–32)
Calcium: 8.6 mg/dL — ABNORMAL LOW (ref 8.9–10.3)
Chloride: 108 mmol/L (ref 98–111)
Creatinine: 1.3 mg/dL — ABNORMAL HIGH (ref 0.61–1.24)
GFR, Estimated: >60 mL/min (ref >60)
Glucose, Bld: 111 mg/dL — ABNORMAL HIGH (ref 70–99)
Potassium: 4.1 mmol/L (ref 3.5–5.1)
Sodium: 142 mmol/L (ref 135–145)
Total Bilirubin: 0.3 mg/dL (ref 0.3–1.2)
Total Protein: 6.3 g/dL — ABNORMAL LOW (ref 6.5–8.1)

## 2020-07-04 LAB — HEPATITIS PANEL, ACUTE
HCV Ab: NONREACTIVE
Hep A IgM: NONREACTIVE
Hep B C IgM: NONREACTIVE
Hepatitis B Surface Ag: NONREACTIVE

## 2020-07-04 LAB — CBC WITH DIFFERENTIAL (CANCER CENTER ONLY)
Abs Immature Granulocytes: 0.02 10*3/uL (ref 0.00–0.07)
Basophils Absolute: 0 10*3/uL (ref 0.0–0.1)
Basophils Relative: 1 %
Eosinophils Absolute: 0.1 10*3/uL (ref 0.0–0.5)
Eosinophils Relative: 2 %
HCT: 39.8 % (ref 39.0–52.0)
Hemoglobin: 11.8 g/dL — ABNORMAL LOW (ref 13.0–17.0)
Immature Granulocytes: 1 %
Lymphocytes Relative: 29 %
Lymphs Abs: 1.2 10*3/uL (ref 0.7–4.0)
MCH: 21.7 pg — ABNORMAL LOW (ref 26.0–34.0)
MCHC: 29.6 g/dL — ABNORMAL LOW (ref 30.0–36.0)
MCV: 73 fL — ABNORMAL LOW (ref 80.0–100.0)
Monocytes Absolute: 0.6 10*3/uL (ref 0.1–1.0)
Monocytes Relative: 14 %
Neutro Abs: 2.2 10*3/uL (ref 1.7–7.7)
Neutrophils Relative %: 53 %
Platelet Count: 279 10*3/uL (ref 150–400)
RBC: 5.45 MIL/uL (ref 4.22–5.81)
RDW: 21.8 % — ABNORMAL HIGH (ref 11.5–15.5)
WBC Count: 4.1 10*3/uL (ref 4.0–10.5)
nRBC: 0 % (ref 0.0–0.2)

## 2020-07-04 LAB — IRON AND TIBC
Iron: 45 ug/dL (ref 42–163)
Saturation Ratios: 11 % — ABNORMAL LOW (ref 20–55)
TIBC: 420 ug/dL — ABNORMAL HIGH (ref 202–409)
UIBC: 375 ug/dL (ref 117–376)

## 2020-07-04 LAB — FERRITIN: Ferritin: 6 ng/mL — ABNORMAL LOW (ref 24–336)

## 2020-07-04 LAB — HIV ANTIBODY (ROUTINE TESTING W REFLEX): HIV Screen 4th Generation wRfx: NONREACTIVE

## 2020-07-04 LAB — VITAMIN B12: Vitamin B-12: 465 pg/mL (ref 180–914)

## 2020-07-04 LAB — FOLATE: Folate: 13.6 ng/mL (ref >5.9)

## 2020-07-04 LAB — LACTATE DEHYDROGENASE: LDH: 180 U/L (ref 98–192)

## 2020-07-04 NOTE — Patient Instructions (Signed)

## 2020-07-05 ENCOUNTER — Ambulatory Visit: Payer: Medicare PPO | Admitting: Physical Therapy

## 2020-07-05 ENCOUNTER — Other Ambulatory Visit: Payer: Self-pay

## 2020-07-05 DIAGNOSIS — M5412 Radiculopathy, cervical region: Secondary | ICD-10-CM | POA: Diagnosis not present

## 2020-07-05 LAB — RHEUMATOID FACTOR: Rheumatoid fact SerPl-aCnc: <10 IU/mL (ref <14.0)

## 2020-07-05 LAB — ANTINUCLEAR ANTIBODIES, IFA: ANA Ab, IFA: NEGATIVE

## 2020-07-05 NOTE — Patient Instructions (Signed)
Access Code: [email or web address] URL: [email or web address] Date: 07/05/2020 Prepared by: Ruben Im  Exercises Seated Passive Cervical Retraction - 3-4 x daily - 7 x weekly - 1 sets - 10 reps Seated Cervical Retraction and Extension - 3-4 x daily - 7 x weekly - 1 sets - 10 reps Seated Upper Trapezius Stretch - 3 x daily - 7 x weekly - 1 sets - 3 reps - 20 hold Supine Shoulder Horizontal Abduction with Resistance - 2 x daily - 7 x weekly - 10 reps - 2 sets Supine Bilateral Shoulder External Rotation with Resistance - 2 x daily - 7 x weekly - 10 reps - 2 sets Supine PNF D2 Flexion with Resistance - 2 x daily - 7 x weekly - 10 reps - 2 sets Cervical Rotation Prone on Elbows - 1 x daily - 7 x weekly - 3 sets - 10 reps Standing Low Trap Setting with Resistance at Wall - 1 x daily - 7 x weekly - 1 sets - 10 reps Wall Scoops - 1 x daily - 7 x weekly - 1 sets - 10 reps Wall Clock with Theraband - 1 x daily - 7 x weekly - 1 sets - 10 reps Push Up on Table - 1 x daily - 7 x weekly - 1 sets - 10 reps Single Arm Bent Over Shoulder Horizontal Abduction with Dumbbell - Palm Down - 1 x daily - 7 x weekly - 1 sets - 10 reps

## 2020-07-05 NOTE — Therapy (Signed)
[facility] [address], [place], [place], [address] Phone: [phone]   Fax:  [phone]  Physical Therapy Treatment  Patient Details  Name: Bryan Smith MRN: [id number] Date of Birth: 14-Feb-1956 Referring Provider (PT): Dr. Alysia Penna   Encounter Date: 07/05/2020   PT End of Session - 07/05/20 1345    Visit Number 5    Number of Visits 12    Date for PT Re-Evaluation 08/02/20    Authorization Type [employer]- 12 visits 06/07/20-07/30/20    Authorization - Visit Number 5    Authorization - Number of Visits 12    PT Start Time 1230    PT Stop Time 1310    PT Time Calculation (min) 40 min    Activity Tolerance Patient tolerated treatment well           Past Medical History:  Diagnosis Date  . Allergy   . ANEMIA-IRON DEFICIENCY 04/26/2007  . Diabetes mellitus without complication ([place])   . GERD 01/04/2007  . Hypertension   . LEUKOCYTOPENIA UNSPECIFIED 01/24/2009    Past Surgical History:  Procedure Laterality Date  . COLONOSCOPY  11/19/2016   per Dr. Fuller Plan, adenomatous polyps, repeat in 5 yrs     There were no vitals filed for this visit.   Subjective Assessment - 07/05/20 1233    Subjective I had an awkward position sleeping b/c my neck is really stiff.  No more UE symptoms.  I'm between 90-95% better.    Pertinent History doing less intense at the gym;  no pain while working out but returns when I get to the car;  Had PT at this facility for neck pain several years ago    Patient Stated Goals get rid of tingling and spasms so I can get back to tennis    Currently in Pain? Yes    Pain Score 1     Pain Location Neck    Pain Orientation Left                             [facility] Adult PT Treatment/Exercise - 07/05/20 0001      Neck Exercises: Machines for Strengthening   UBE (Upper Arm Bike) Level 2.0 x6 minutes (3/3)- PT present to discuss progress      Neck Exercises: Theraband    Other Theraband Exercises green loop UE wall slides with lift off 2x5    Other Theraband Exercises green loop scoops 5x; wall clocks with green loop 2x5      Neck Exercises: Standing   Other Standing Exercises red band row and rotate 10x2 right/left    Other Standing Exercises counter push ups 2x 10      Neck Exercises: Prone   Other Prone Exercise 3# 3 ways:extension, horizontal abduction, scaption 10x                  PT Education - 07/05/20 1344    Education Details wall ex's with red band: UE flexion lift offs, scoops, clocks; quadruped horiztontal abduction; counter push ups    Person(s) Educated Patient    Methods Explanation;Demonstration;Handout    Comprehension Returned demonstration;Verbalized understanding            PT Short Term Goals - 07/03/20 1020      PT SHORT TERM GOAL #1   Title The patient will demonstrate basic self care measures and exercises to decrease neck and UE symptoms  Status Achieved      PT SHORT TERM GOAL #2   Title The patient will report a 30% reduction in neck pain and right UE muscle spasm/twitches while sitting    Status Achieved      PT SHORT TERM GOAL #3   Title Improved cervical right sidebending to 40 degrees and left rotation to 60 degrees needed for return to tennis    Status On-going             PT Long Term Goals - 07/03/20 1020      PT LONG TERM GOAL #1   Title The patient will be independent in a safe self progression of HEP    Status On-going      PT LONG TERM GOAL #2   Title The patient will report a 60% reduction in neck and right UE symptoms with sitting    Baseline 90% overall improvement but increased pain with sitting    Status On-going      PT LONG TERM GOAL #3   Title The patient will have cervical and UE mobility and strength needed to return to tennis    Status On-going                 Plan - 07/05/20 1346    Clinical Impression Statement The patient reports initial stiffness from  sleeping awkwardly is resolved with exercise during treatment session.  He is able to progress with intensity of resistive strengthening including targeted periscapular muscles to help with return to tennis.  He rates his overall improvement between 90-95%.  Therapist monitoring response and providing cues for activation of targeted muscles.    Personal Factors and Comorbidities Past/Current Experience;Comorbidity 1    Rehab Potential Good    PT Frequency 2x / week    PT Duration 8 weeks    PT Treatment/Interventions ADLs/Self Care Home Management;Cryotherapy;Electrical Stimulation;Iontophoresis 4mg /ml Dexamethasone;Moist Heat;Traction;Ultrasound;Neuromuscular re-education;Therapeutic exercise;Therapeutic activities;Patient/family education;Manual techniques;Dry needling;Taping;Spinal Manipulations    PT Next Visit Plan recheck cervical ROM for STG;  manual therapy if needed;  postural strength, cervical/thoracic mobility; scapular muscle strength;  if continues to have minimal pain may be ready to decrease treatment frequency    PT Home Exercise Plan [facility]           Patient will benefit from skilled therapeutic intervention in order to improve the following deficits and impairments:  Pain,Decreased range of motion,Impaired UE functional use,Increased muscle spasms,Hypomobility  Visit Diagnosis: Radiculopathy, cervical region     Problem List Patient Active Problem List   Diagnosis Date Noted  . Diabetes mellitus type 2 in obese ([place]) 01/17/2016  . Chronic neck pain 01/16/2015  . Palpitations 01/16/2015  . Anxiety state 01/16/2015  . Leukocytopenia 01/24/2009  . SKIN TAG 11/29/2007  . ANEMIA-IRON DEFICIENCY 04/26/2007  . HTN (hypertension) 04/26/2007  . GERD 01/04/2007   Ruben Im, PT 07/05/20 1:51 PM Phone: [phone] Fax: [phone] Alvera Singh 07/05/2020, 1:51 PM  [facility] [address] [place], [place], [address] Phone: [phone]   Fax:  [phone]  Name: VAHAN WADSWORTH MRN: [id number] Date of Birth: 1955-09-14

## 2020-07-06 ENCOUNTER — Telehealth: Payer: Self-pay | Admitting: Physician Assistant

## 2020-07-06 LAB — PROTEIN ELECTROPHORESIS, SERUM, WITH REFLEX
A/G Ratio: 1.1 (ref 0.7–1.7)
Albumin ELP: 3.1 g/dL (ref 2.9–4.4)
Alpha-1-Globulin: 0.1 g/dL (ref 0.0–0.4)
Alpha-2-Globulin: 0.8 g/dL (ref 0.4–1.0)
Beta Globulin: 1.1 g/dL (ref 0.7–1.3)
Gamma Globulin: 0.7 g/dL (ref 0.4–1.8)
Globulin, Total: 2.7 g/dL (ref 2.2–3.9)
Total Protein ELP: 5.8 g/dL — ABNORMAL LOW (ref 6.0–8.5)

## 2020-07-06 NOTE — Telephone Encounter (Signed)
Scheduled per los. Called and left msg. Mailed printout  °

## 2020-07-10 ENCOUNTER — Other Ambulatory Visit: Payer: Self-pay

## 2020-07-10 ENCOUNTER — Telehealth: Payer: Self-pay | Admitting: Physician Assistant

## 2020-07-10 DIAGNOSIS — R972 Elevated prostate specific antigen [PSA]: Secondary | ICD-10-CM | POA: Diagnosis not present

## 2020-07-10 DIAGNOSIS — D709 Neutropenia, unspecified: Secondary | ICD-10-CM

## 2020-07-10 DIAGNOSIS — D509 Iron deficiency anemia, unspecified: Secondary | ICD-10-CM

## 2020-07-10 LAB — OCCULT BLOOD X 1 CARD TO LAB, STOOL
Fecal Occult Bld: NEGATIVE
Fecal Occult Bld: NEGATIVE
Fecal Occult Bld: NEGATIVE

## 2020-07-10 LAB — PSA: PSA: 1.85

## 2020-07-10 NOTE — Telephone Encounter (Signed)
I called to let him know his stool cards were negative for blood.

## 2020-07-12 ENCOUNTER — Ambulatory Visit: Payer: Medicare PPO | Attending: Family Medicine | Admitting: Physical Therapy

## 2020-07-12 ENCOUNTER — Other Ambulatory Visit: Payer: Self-pay

## 2020-07-12 DIAGNOSIS — M5412 Radiculopathy, cervical region: Secondary | ICD-10-CM | POA: Diagnosis not present

## 2020-07-12 NOTE — Therapy (Signed)
[facility] [address] [place], [place], [address] Phone: [phone]   Fax:  [phone]  Physical Therapy Treatment  Patient Details  Name: Bryan Smith MRN: [id number] Date of Birth: 1955-12-06 Referring Provider (PT): Dr. Alysia Penna   Encounter Date: 07/12/2020   PT End of Session - 07/12/20 1956    Visit Number 6    Number of Visits 12    Date for PT Re-Evaluation 08/02/20    Authorization Type [employer]- 12 visits 06/07/20-07/30/20    Authorization - Visit Number 6    Authorization - Number of Visits 12    PT Start Time 4270    PT Stop Time 1055    PT Time Calculation (min) 40 min    Activity Tolerance Patient tolerated treatment well           Past Medical History:  Diagnosis Date  . Allergy   . ANEMIA-IRON DEFICIENCY 04/26/2007  . Diabetes mellitus without complication ([place])   . GERD 01/04/2007  . Hypertension   . LEUKOCYTOPENIA UNSPECIFIED 01/24/2009    Past Surgical History:  Procedure Laterality Date  . COLONOSCOPY  11/19/2016   per Dr. Fuller Plan, adenomatous polyps, repeat in 5 yrs     There were no vitals filed for this visit.   Subjective Assessment - 07/12/20 1020    Subjective Going to hit some tennis balls today.  Everything was good except a really sore arm from booster shot.    Pertinent History doing less intense at the gym;  no pain while working out but returns when I get to the car;  Had PT at this facility for neck pain several years ago    Currently in Pain? No/denies    Pain Score 0-No pain   just tightness on the left side where shot was   Pain Location Neck    Pain Orientation Left              [facility] PT Assessment - 07/12/20 0001      Assessment   Medical Diagnosis cervicalgia    Referring Provider (PT) Dr. Alysia Penna      Observation/Other Assessments   Focus on Therapeutic Outcomes (FOTO)  82%      AROM   Cervical Flexion 46    Cervical Extension 45     Cervical - Right Side Bend 40    Cervical - Left Side Bend 40    Cervical - Right Rotation 50    Cervical - Left Rotation 50      Strength   Overall Strength Within functional limits for tasks performed                         [facility] Adult PT Treatment/Exercise - 07/12/20 0001      Neck Exercises: Machines for Strengthening   UBE (Upper Arm Bike) Level 2.0 x6 minutes (3/3)- PT present to discuss progress while discussing progress/status      Neck Exercises: Seated   Cervical Rotation Right;Left;5 reps      Neck Exercises: Supine   Other Supine Exercise pool noodle thoracic extension at multiple levels 5-8x each level      Neck Exercises: Sidelying   Other Sidelying Exercise open books 15x right/left      Neck Exercises: Prone   Other Prone Exercise quadruped thoracic extension with childs pose and added UE lift 2 sets of 5  PT Education - 07/12/20 1955    Education Details open books, thoracic extension on pool noodle; childs pose thoacic extension with arm lift    Person(s) Educated Patient    Methods Explanation;Demonstration;Handout    Comprehension Returned demonstration;Verbalized understanding            PT Short Term Goals - 07/12/20 1022      PT SHORT TERM GOAL #1   Title The patient will demonstrate basic self care measures and exercises to decrease neck and UE symptoms    Status Achieved      PT SHORT TERM GOAL #2   Title The patient will report a 30% reduction in neck pain and right UE muscle spasm/twitches while sitting    Status Achieved      PT SHORT TERM GOAL #3   Title Improved cervical right sidebending to 40 degrees and left rotation to 60 degrees needed for return to tennis    Status Achieved             PT Long Term Goals - 07/12/20 2002      PT LONG TERM GOAL #1   Title The patient will be independent in a safe self progression of HEP    Time 8    Period Weeks    Status On-going      PT LONG  TERM GOAL #2   Title The patient will report a 60% reduction in neck and right UE symptoms with sitting    Status Achieved      PT LONG TERM GOAL #3   Title The patient will have cervical and UE mobility and strength needed to return to tennis    Time 8    Period Weeks    Status On-going      PT LONG TERM GOAL #4   Title FOTO functional outcome score improved from 64% to 75%    Status Achieved                 Plan - 07/12/20 1956    Clinical Impression Statement The patient reports no neck pain today and doing well overall.  No peripheral symptoms in the last 2 weeks.    His FOTO score has significantly improved from 64% to 82% meeting long term goal.  Cervical ROM is WFLs overall with some limitation with rotation however he does not feel limited by this.  He is eager to test his ability to hit some tennis balls today after session.  He is progressing very well with rehab goals.  Anticipate readiness for discharge in next few visits.    Personal Factors and Comorbidities Past/Current Experience;Comorbidity 1    Stability/Clinical Decision Making Stable/Uncomplicated    Rehab Potential Good    PT Frequency 2x / week    PT Duration 8 weeks    PT Treatment/Interventions ADLs/Self Care Home Management;Cryotherapy;Electrical Stimulation;Iontophoresis 4mg /ml Dexamethasone;Moist Heat;Traction;Ultrasound;Neuromuscular re-education;Therapeutic exercise;Therapeutic activities;Patient/family education;Manual techniques;Dry needling;Taping;Spinal Manipulations    PT Next Visit Plan see how tennis went;  manual therapy if needed;  postural strength, cervical/thoracic mobility; scapular muscle strength;  if continues to have minimal pain may be ready to decrease treatment frequency or discharge in next few visits    PT [place]           Patient will benefit from skilled therapeutic intervention in order to improve the following deficits and impairments:  Pain,Decreased  range of motion,Impaired UE functional use,Increased muscle spasms,Hypomobility  Visit Diagnosis: Radiculopathy, cervical region  Problem List Patient Active Problem List   Diagnosis Date Noted  . Diabetes mellitus type 2 in obese ([place]) 01/17/2016  . Chronic neck pain 01/16/2015  . Palpitations 01/16/2015  . Anxiety state 01/16/2015  . Leukocytopenia 01/24/2009  . SKIN TAG 11/29/2007  . ANEMIA-IRON DEFICIENCY 04/26/2007  . HTN (hypertension) 04/26/2007  . GERD 01/04/2007   Ruben Im, PT 07/12/20 8:03 PM Phone: [phone] Fax: [phone] Alvera Singh 07/12/2020, 8:03 PM  [facility] [address], [place], [place], [address] Phone: [phone]   Fax:  [phone]  Name: Bryan Smith MRN: [id number] Date of Birth: 1955/04/03

## 2020-07-12 NOTE — Patient Instructions (Signed)
Access Code: [email or web address] URL: [email or web address] Date: 07/12/2020 Prepared by: Ruben Im  Exercises Seated Passive Cervical Retraction - 3-4 x daily - 7 x weekly - 1 sets - 10 reps Seated Cervical Retraction and Extension - 3-4 x daily - 7 x weekly - 1 sets - 10 reps Seated Upper Trapezius Stretch - 3 x daily - 7 x weekly - 1 sets - 3 reps - 20 hold Supine Shoulder Horizontal Abduction with Resistance - 2 x daily - 7 x weekly - 10 reps - 2 sets Supine Bilateral Shoulder External Rotation with Resistance - 2 x daily - 7 x weekly - 10 reps - 2 sets Supine PNF D2 Flexion with Resistance - 2 x daily - 7 x weekly - 10 reps - 2 sets Cervical Rotation Prone on Elbows - 1 x daily - 7 x weekly - 3 sets - 10 reps Standing Low Trap Setting with Resistance at Wall - 1 x daily - 7 x weekly - 1 sets - 10 reps Wall Scoops - 1 x daily - 7 x weekly - 1 sets - 10 reps Wall Clock with Theraband - 1 x daily - 7 x weekly - 1 sets - 10 reps Push Up on Table - 1 x daily - 7 x weekly - 1 sets - 10 reps Single Arm Bent Over Shoulder Horizontal Abduction with Dumbbell - Palm Down - 1 x daily - 7 x weekly - 1 sets - 10 reps Sidelying Open Book Thoracic Rotation with Knee on Foam Roll - 1 x daily - 7 x weekly - 1 sets - 10 reps Thoracic Extension Mobilization with Noodle - 1 x daily - 7 x weekly - 1 sets - 10 reps Quadruped Thoracic Spine Extension - 1 x daily - 7 x weekly - 1 sets - 10 reps

## 2020-07-17 ENCOUNTER — Ambulatory Visit: Payer: Medicare PPO

## 2020-07-17 ENCOUNTER — Other Ambulatory Visit: Payer: Self-pay

## 2020-07-17 DIAGNOSIS — M5412 Radiculopathy, cervical region: Secondary | ICD-10-CM

## 2020-07-17 NOTE — Therapy (Signed)
[facility] [address], [place], [place], [address] Phone: [phone]   Fax:  [phone]  Physical Therapy Treatment  Patient Details  Name: Bryan Smith MRN: [id number] Date of Birth: [birth year] Referring Provider (PT): Dr. Alysia Penna   Encounter Date: 07/17/2020   PT End of Session - 07/17/20 1059    Visit Number 7    Date for PT Re-Evaluation 08/02/20    Authorization Type [employer]- 12 visits 06/07/20-07/30/20    Authorization - Visit Number 7    Authorization - Number of Visits 12    Progress Note Due on Visit 10    PT Start Time 8416    PT Stop Time 1055    PT Time Calculation (min) 40 min    Activity Tolerance Patient tolerated treatment well    Behavior During Therapy [facility] for tasks assessed/performed           Past Medical History:  Diagnosis Date  . Allergy   . ANEMIA-IRON DEFICIENCY 04/26/2007  . Diabetes mellitus without complication ([place])   . GERD 01/04/2007  . Hypertension   . LEUKOCYTOPENIA UNSPECIFIED 01/24/2009    Past Surgical History:  Procedure Laterality Date  . COLONOSCOPY  11/19/2016   per Dr. Fuller Plan, adenomatous polyps, repeat in 5 yrs     There were no vitals filed for this visit.   Subjective Assessment - 07/17/20 1018    Subjective I played tennis after a left here.  I didn't have any pain after.  On Sunday, I felt pain on the Lt side of my neck/shoulder- 3/10 and brief.    Pertinent History doing less intense at the gym;  no pain while working out but returns when I get to the car;  Had PT at this facility for neck pain several years ago    Currently in Pain? No/denies                             [facility] Adult PT Treatment/Exercise - 07/17/20 0001      Neck Exercises: Machines for Strengthening   UBE (Upper Arm Bike) Level 2.0 x6 minutes (3/3)- PT present to discuss progress while discussing progress/status      Neck Exercises: Theraband   Other  Theraband Exercises red loop UE wall slides with lift off 2x5    Other Theraband Exercises red loop scoops 5x; wall clocks with green loop 2x5      Neck Exercises: Sidelying   Other Sidelying Exercise open books 15x right/left      Shoulder Exercises: Seated   Other Seated Exercises 3 way raises: 2# 2x10 eac      Manual Therapy   Manual Therapy Soft tissue mobilization    Manual therapy comments elongation to bil upper traps and cervical paraspinals                    PT Short Term Goals - 07/12/20 1022      PT SHORT TERM GOAL #1   Title The patient will demonstrate basic self care measures and exercises to decrease neck and UE symptoms    Status Achieved      PT SHORT TERM GOAL #2   Title The patient will report a 30% reduction in neck pain and right UE muscle spasm/twitches while sitting    Status Achieved      PT SHORT TERM GOAL #3   Title Improved cervical right sidebending to  40 degrees and left rotation to 60 degrees needed for return to tennis    Status Achieved             PT Long Term Goals - 07/12/20 2002      PT LONG TERM GOAL #1   Title The patient will be independent in a safe self progression of HEP    Time 8    Period Weeks    Status On-going      PT LONG TERM GOAL #2   Title The patient will report a 60% reduction in neck and right UE symptoms with sitting    Status Achieved      PT LONG TERM GOAL #3   Title The patient will have cervical and UE mobility and strength needed to return to tennis    Time 8    Period Weeks    Status On-going      PT LONG TERM GOAL #4   Title FOTO functional outcome score improved from 64% to 75%    Status Achieved                 Plan - 07/17/20 1101    Clinical Impression Statement Pt played tennis last week and did well.  Some muscular fatigue and soreness after but no increase in pain.  Pt with Lt upper trap/neck pain 2 days ago that was 3/10 and was brief.  No peripheral symptoms in the last  3 weeks and pt is using traction a few days a week.    His FOTO score was significantly improved from 64 to 82 last week, meeting long term goal.  Cervical ROM is WFLs overall with some limitation with rotation however he does not feel limited by this. Pt demonstrates fatigue with scapular band exercises at the wall.  Pt with tension in Lt>Rt upper traps and responded well to manual therapy.  Pt will reduce frequency to 1x/wk due to feeling better overall.  Pt plans to play a tennis match soon    PT Frequency 2x / week    PT Duration 8 weeks    PT Treatment/Interventions ADLs/Self Care Home Management;Cryotherapy;Electrical Stimulation;Iontophoresis 4mg /ml Dexamethasone;Moist Heat;Traction;Ultrasound;Neuromuscular re-education;Therapeutic exercise;Therapeutic activities;Patient/family education;Manual techniques;Dry needling;Taping;Spinal Manipulations    PT Next Visit Plan probable D/C soon.  See how tennis goes again this week    Consulted and Agree with Plan of Care Patient           Patient will benefit from skilled therapeutic intervention in order to improve the following deficits and impairments:  Pain,Decreased range of motion,Impaired UE functional use,Increased muscle spasms,Hypomobility  Visit Diagnosis: Radiculopathy, cervical region     Problem List Patient Active Problem List   Diagnosis Date Noted  . Diabetes mellitus type 2 in obese ([place]) 01/17/2016  . Chronic neck pain 01/16/2015  . Palpitations 01/16/2015  . Anxiety state 01/16/2015  . Leukocytopenia 01/24/2009  . SKIN TAG 11/29/2007  . ANEMIA-IRON DEFICIENCY 04/26/2007  . HTN (hypertension) 04/26/2007  . GERD 01/04/2007     Sigurd Sos, PT 07/17/20 11:02 AM  [facility] [address] [place], [place], [address] Phone: [phone]   Fax:  [phone]  Name: Bryan Smith MRN: [id number] Date of Birth: 1955/08/08

## 2020-07-23 DIAGNOSIS — R338 Other retention of urine: Secondary | ICD-10-CM | POA: Diagnosis not present

## 2020-07-23 DIAGNOSIS — N401 Enlarged prostate with lower urinary tract symptoms: Secondary | ICD-10-CM | POA: Diagnosis not present

## 2020-07-23 DIAGNOSIS — R972 Elevated prostate specific antigen [PSA]: Secondary | ICD-10-CM | POA: Diagnosis not present

## 2020-07-24 ENCOUNTER — Other Ambulatory Visit: Payer: Self-pay

## 2020-07-24 ENCOUNTER — Ambulatory Visit: Payer: Medicare PPO

## 2020-07-24 DIAGNOSIS — M5412 Radiculopathy, cervical region: Secondary | ICD-10-CM

## 2020-07-24 NOTE — Therapy (Signed)
[facility] [address], [place], [place], [address] Phone: [phone]   Fax:  [phone]  Physical Therapy Treatment  Patient Details  Name: Bryan Smith MRN: [id number] Date of Birth: 1955-11-06 Referring Provider (PT): Dr. Alysia Penna   Encounter Date: 07/24/2020   PT End of Session - 07/24/20 1058    Visit Number 8    Date for PT Re-Evaluation 08/02/20    Authorization Type [employer]- 12 visits 06/07/20-07/30/20    Authorization - Visit Number 8    Authorization - Number of Visits 12    Progress Note Due on Visit 10    PT Start Time 1016    PT Stop Time 1050    PT Time Calculation (min) 34 min    Activity Tolerance Patient tolerated treatment well    Behavior During Therapy [facility] for tasks assessed/performed           Past Medical History:  Diagnosis Date  . Allergy   . ANEMIA-IRON DEFICIENCY 04/26/2007  . Diabetes mellitus without complication ([place])   . GERD 01/04/2007  . Hypertension   . LEUKOCYTOPENIA UNSPECIFIED 01/24/2009    Past Surgical History:  Procedure Laterality Date  . COLONOSCOPY  11/19/2016   per Dr. Fuller Plan, adenomatous polyps, repeat in 5 yrs     There were no vitals filed for this visit.   Subjective Assessment - 07/24/20 1021    Subjective I haven't had a chance to play tennis.    Currently in Pain? No/denies              [facility] PT Assessment - 07/24/20 0001      Assessment   Medical Diagnosis cervicalgia    Referring Provider (PT) Dr. Alysia Penna      Observation/Other Assessments   Focus on Therapeutic Outcomes (FOTO)  82      AROM   Cervical Extension 45    Cervical - Right Side Bend 40    Cervical - Left Side Bend 40    Cervical - Right Rotation 50    Cervical - Left Rotation 50                         [facility] Adult PT Treatment/Exercise - 07/24/20 0001      Neck Exercises: Machines for Strengthening   UBE (Upper Arm Bike) Level 2.0 x6  minutes (3/3)- PT present to discuss progress while discussing progress/status      Neck Exercises: Theraband   Other Theraband Exercises red loop UE wall slides with lift off 2x5    Other Theraband Exercises red loop scoops 5x; wall clocks with red loop 2x5      Neck Exercises: Standing   Other Standing Exercises counter push ups 2x 10      Neck Exercises: Sidelying   Other Sidelying Exercise open books 15x right/left      Shoulder Exercises: Seated   Other Seated Exercises 3 way raises: 2# 2x10 eac                    PT Short Term Goals - 07/12/20 1022      PT SHORT TERM GOAL #1   Title The patient will demonstrate basic self care measures and exercises to decrease neck and UE symptoms    Status Achieved      PT SHORT TERM GOAL #2   Title The patient will report a 30% reduction in neck pain and right UE  muscle spasm/twitches while sitting    Status Achieved      PT SHORT TERM GOAL #3   Title Improved cervical right sidebending to 40 degrees and left rotation to 60 degrees needed for return to tennis    Status Achieved             PT Long Term Goals - 07/24/20 1031      PT LONG TERM GOAL #1   Title The patient will be independent in a safe self progression of HEP    Status On-going      PT LONG TERM GOAL #2   Title The patient will report a 60% reduction in neck and right UE symptoms with sitting    Baseline 90% overall improvement but increased pain with sitting    Status Achieved      PT LONG TERM GOAL #3   Title The patient will have cervical and UE mobility and strength needed to return to tennis    Baseline pt has not played much tennis- will try to do this before next visit to determine progress toward goal and readiness for D/C    Status On-going                 Plan - 07/24/20 1040    Clinical Impression Statement Pt has not had a chance to play more tennis since last session.  PT encouraged pt to try this before next session to assess  his goal.  Pt experiences intermittent cervical tension with activity and this is brief.  Cervical ROM is WFLs overall with some limitation with rotation however he does not feel limited by this. Pt demonstrates fatigue with scapular band exercises at the wall.  Pt with tension in Lt>Rt upper traps and responded well to manual therapy.  Pt will reduce frequency to 1x/wk due to feeling better overall.  Pt plans to play a tennis match soon and probable D/C to HEP in 1-2 sessions.    PT Frequency 2x / week    PT Duration 8 weeks    PT Treatment/Interventions ADLs/Self Care Home Management;Cryotherapy;Electrical Stimulation;Iontophoresis 4mg /ml Dexamethasone;Moist Heat;Traction;Ultrasound;Neuromuscular re-education;Therapeutic exercise;Therapeutic activities;Patient/family education;Manual techniques;Dry needling;Taping;Spinal Manipulations    PT Next Visit Plan probable D/C soon.  See how tennis goes again this week    PT [place] and Agree with Plan of Care Patient           Patient will benefit from skilled therapeutic intervention in order to improve the following deficits and impairments:  Pain,Decreased range of motion,Impaired UE functional use,Increased muscle spasms,Hypomobility  Visit Diagnosis: Radiculopathy, cervical region     Problem List Patient Active Problem List   Diagnosis Date Noted  . Diabetes mellitus type 2 in obese ([place]) 01/17/2016  . Chronic neck pain 01/16/2015  . Palpitations 01/16/2015  . Anxiety state 01/16/2015  . Leukocytopenia 01/24/2009  . SKIN TAG 11/29/2007  . ANEMIA-IRON DEFICIENCY 04/26/2007  . HTN (hypertension) 04/26/2007  . GERD 01/04/2007     Sigurd Sos, PT 07/24/20 11:01 AM  [facility] [address] [place], [place], [address] Phone: [phone]   Fax:  [phone]  Name: Bryan Smith MRN: [id number] Date of Birth: [birth year]

## 2020-07-31 ENCOUNTER — Other Ambulatory Visit: Payer: Self-pay

## 2020-07-31 ENCOUNTER — Ambulatory Visit: Payer: Medicare PPO

## 2020-07-31 DIAGNOSIS — M5412 Radiculopathy, cervical region: Secondary | ICD-10-CM | POA: Diagnosis not present

## 2020-07-31 NOTE — Therapy (Signed)
[facility] [address], [place], [place], [address] Phone: [phone]   Fax:  [phone]  Physical Therapy Treatment  Patient Details  Name: Bryan Smith MRN: [id number] Date of Birth: [birth year] Referring Provider (PT): Dr. Alysia Penna   Encounter Date: 07/31/2020   PT End of Session - 07/31/20 1041    Visit Number 9    Date for PT Re-Evaluation 08/10/20    Authorization Type [employer]- 12 visits 06/07/20-07/30/20, 3 visits 07/31/20-08/10/20    Authorization - Visit Number 1    Authorization - Number of Visits 3    Progress Note Due on Visit 10    PT Start Time 1016    PT Stop Time 1058    PT Time Calculation (min) 42 min    Activity Tolerance Patient tolerated treatment well    Behavior During Therapy [facility] for tasks assessed/performed           Past Medical History:  Diagnosis Date  . Allergy   . ANEMIA-IRON DEFICIENCY 04/26/2007  . Diabetes mellitus without complication ([place])   . GERD 01/04/2007  . Hypertension   . LEUKOCYTOPENIA UNSPECIFIED 01/24/2009    Past Surgical History:  Procedure Laterality Date  . COLONOSCOPY  11/19/2016   per Dr. Fuller Plan, adenomatous polyps, repeat in 5 yrs     There were no vitals filed for this visit.   Subjective Assessment - 07/31/20 1020    Subjective I had a minor set-back.  I had 2 days of pain (5/10) in my neck, Lt shoulder blade and a little bit down my arm.  I don't know what I did to cause.    Pain Score 0-No pain   up to 5/10 in neck and scapula for 2 days last week   Pain Location Neck    Pain Orientation Left    Pain Type Acute pain              [facility] PT Assessment - 07/31/20 0001      Assessment   Medical Diagnosis cervicalgia    Referring Provider (PT) Dr. Alysia Penna      Observation/Other Assessments   Focus on Therapeutic Outcomes (FOTO)  82      AROM   Cervical Extension 45    Cervical - Right Side Bend 40    Cervical - Left Side Bend  40    Cervical - Right Rotation 50    Cervical - Left Rotation 50                         [facility] Adult PT Treatment/Exercise - 07/31/20 0001      Neck Exercises: Machines for Strengthening   UBE (Upper Arm Bike) Level 2.0 x6 minutes (3/3)- PT present to discuss progress while discussing progress/status      Neck Exercises: Sidelying   Other Sidelying Exercise open books 15x right/left      Shoulder Exercises: Seated   Other Seated Exercises 3 way raises: 2# 2x10 eac      Manual Therapy   Manual Therapy Soft tissue mobilization    Manual therapy comments elongation to bil upper traps and cervical paraspinals    Joint Mobilization sideglides, quadrant stretch with mobs at end range bilaterally                    PT Short Term Goals - 07/12/20 1022      PT SHORT TERM GOAL #1  Title The patient will demonstrate basic self care measures and exercises to decrease neck and UE symptoms    Status Achieved      PT SHORT TERM GOAL #2   Title The patient will report a 30% reduction in neck pain and right UE muscle spasm/twitches while sitting    Status Achieved      PT SHORT TERM GOAL #3   Title Improved cervical right sidebending to 40 degrees and left rotation to 60 degrees needed for return to tennis    Status Achieved             PT Long Term Goals - 07/31/20 1104      PT LONG TERM GOAL #1   Title The patient will be independent in a safe self progression of HEP    Status On-going      PT LONG TERM GOAL #2   Title The patient will report a 60% reduction in neck and right UE symptoms with sitting    Baseline 90% overall improvement but increased pain with sitting.  Had 2 day flare up last week    Status On-going      PT LONG TERM GOAL #3   Title The patient will have cervical and UE mobility and strength needed to return to tennis    Baseline pt has not played much tennis- will try to do this before next visit to determine progress toward goal  and readiness for D/C    Status On-going      PT LONG TERM GOAL #4   Title FOTO functional outcome score improved from 64% to 75%    Status Achieved                 Plan - 07/31/20 1033    Clinical Impression Statement Pt had a flare-up of symptoms over the weekend with unknown cause.  Pt with 5/10 neck and Lt scapular pain x 2 days.  Pain has resolved now and pt has not been able to play tennis due to this flare-up.  PT encouraged pt to try playing tennis this week.  Pt with tension in bil neck and Lt scapular muscles today and had good response to manual today with improved tissue mobility and reduced stiffness.  Pt will try tennis and continue with flexibility and postural strength to address symptoms.  2 more visit approved.    PT Frequency 1x / week    PT Duration 3 weeks    PT Treatment/Interventions ADLs/Self Care Home Management;Cryotherapy;Electrical Stimulation;Iontophoresis 4mg /ml Dexamethasone;Moist Heat;Traction;Ultrasound;Neuromuscular re-education;Therapeutic exercise;Therapeutic activities;Patient/family education;Manual techniques;Dry needling;Taping;Spinal Manipulations    PT Next Visit Plan 2 more sessions.  See how pt responded to DN.  See how tennis goes again this week.  10th visit summary.    PT Home Exercise Plan [facility]    Recommended Other Services recert sent 5/45/62    Consulted and Agree with Plan of Care Patient           Patient will benefit from skilled therapeutic intervention in order to improve the following deficits and impairments:  Pain,Decreased range of motion,Impaired UE functional use,Increased muscle spasms,Hypomobility  Visit Diagnosis: Radiculopathy, cervical region - Plan: PT plan of care cert/re-cert     Problem List Patient Active Problem List   Diagnosis Date Noted  . Diabetes mellitus type 2 in obese ([place]) 01/17/2016  . Chronic neck pain 01/16/2015  . Palpitations 01/16/2015  . Anxiety state 01/16/2015  . Leukocytopenia  01/24/2009  . SKIN TAG 11/29/2007  .  ANEMIA-IRON DEFICIENCY 04/26/2007  . HTN (hypertension) 04/26/2007  . GERD 01/04/2007     Sigurd Sos, PT 07/31/20 11:11 AM  [facility] [address] [place], [place], [address] Phone: [phone]   Fax:  [phone]  Name: JUSTIN MEISENHEIMER MRN: [id number] Date of Birth: [birth year]

## 2020-08-02 ENCOUNTER — Encounter: Payer: Medicare PPO | Admitting: Physical Therapy

## 2020-08-07 ENCOUNTER — Other Ambulatory Visit: Payer: Self-pay

## 2020-08-07 ENCOUNTER — Ambulatory Visit: Payer: Medicare PPO

## 2020-08-07 DIAGNOSIS — M5412 Radiculopathy, cervical region: Secondary | ICD-10-CM | POA: Diagnosis not present

## 2020-08-07 NOTE — Therapy (Addendum)
[facility] [address], [place], [place], [address] Phone: [phone]   Fax:  [phone]  Physical Therapy Treatment  Patient Details  Name: Bryan Smith MRN: [id number] Date of Birth: 11-23-1955 Referring Provider (PT): Dr. Alysia Penna   Encounter Date: 08/07/2020  Progress Note Reporting Period 06/07/20 to 08/07/20  See note below for Objective Data and Assessment of Progress/Goals.             PT End of Session - 08/07/20 1056    Visit Number 10    Date for PT Re-Evaluation 08/10/20    Authorization Type [employer]- 12 visits 06/07/20-07/30/20, 3 visits 07/31/20-08/10/20    Authorization - Visit Number 2    Authorization - Number of Visits 3    Progress Note Due on Visit 10    PT Start Time 1017    PT Stop Time 1058    PT Time Calculation (min) 41 min    Activity Tolerance Patient tolerated treatment well    Behavior During Therapy W[facility] for tasks assessed/performed           Past Medical History:  Diagnosis Date  . Allergy   . ANEMIA-IRON DEFICIENCY 04/26/2007  . Diabetes mellitus without complication ([place])   . GERD 01/04/2007  . Hypertension   . LEUKOCYTOPENIA UNSPECIFIED 01/24/2009    Past Surgical History:  Procedure Laterality Date  . COLONOSCOPY  11/19/2016   per Dr. Fuller Plan, adenomatous polyps, repeat in 5 yrs     There were no vitals filed for this visit.   Subjective Assessment - 08/07/20 1020    Subjective I played tennis and I did good.  Only muscle soreness as a result.  I had a twinge a couple of days later but it didn't last.    Currently in Pain? No/denies              [facility] PT Assessment - 08/07/20 0001      Assessment   Medical Diagnosis cervicalgia    Referring Provider (PT) Dr. Alysia Penna      Home Environment   Living Environment Private residence      Observation/Other Assessments   Focus on Therapeutic Outcomes (FOTO)  82      AROM   Cervical - Right Side  Bend 40    Cervical - Left Side Bend 40    Cervical - Right Rotation 50    Cervical - Left Rotation 50                         [facility] Adult PT Treatment/Exercise - 08/07/20 0001      Neck Exercises: Machines for Strengthening   UBE (Upper Arm Bike) Level 2.0 x6 minutes (3/3)- PT present to discuss progress while discussing progress/status      Neck Exercises: Theraband   Other Theraband Exercises yellow loop UE wall slides with lift off 2x5    Other Theraband Exercises yellow loop scoops 5x; wall clocks with red loop 2x5      Neck Exercises: Standing   Other Standing Exercises counter push ups 2x 10      Neck Exercises: Sidelying   Other Sidelying Exercise open books 15x right/left      Shoulder Exercises: Seated   Other Seated Exercises 3 way raises: 2# 2x10 eac      Manual Therapy   Manual Therapy Soft tissue mobilization    Manual therapy comments elongation to bil upper traps and cervical  paraspinals    Joint Mobilization sideglides, quadrant stretch with mobs at end range bilaterally                    PT Short Term Goals - 07/12/20 1022      PT SHORT TERM GOAL #1   Title The patient will demonstrate basic self care measures and exercises to decrease neck and UE symptoms    Status Achieved      PT SHORT TERM GOAL #2   Title The patient will report a 30% reduction in neck pain and right UE muscle spasm/twitches while sitting    Status Achieved      PT SHORT TERM GOAL #3   Title Improved cervical right sidebending to 40 degrees and left rotation to 60 degrees needed for return to tennis    Status Achieved             PT Long Term Goals - 08/07/20 1027      PT LONG TERM GOAL #1   Title The patient will be independent in a safe self progression of HEP    Status On-going      PT LONG TERM GOAL #2   Title The patient will report a 60% reduction in neck and right UE symptoms with sitting      PT LONG TERM GOAL #3   Title The patient  will have cervical and UE mobility and strength needed to return to tennis    Baseline played tennis and didn't have any difficulty      PT LONG TERM GOAL #4   Title FOTO functional outcome score improved from 64% to 75%    Status Achieved                 Plan - 08/07/20 1032    Clinical Impression Statement Pt reports 90% overall improvement in symptoms since the start of care.  Pt was able to play tennis last week without increased pain.  Pt reports muscle soreness but no reproduction of radiculopathy.  Pt is working on postural strength and alignment and endurance.  Pt with mild tension and trigger points in bil neck and upper traps and demonstrates improved mobility in the cervical spine.  Pt will attend 1 more session to finalize and work on tissue mobility.    Rehab Potential Good    PT Frequency 1x / week    PT Duration 3 weeks    PT Treatment/Interventions ADLs/Self Care Home Management;Cryotherapy;Electrical Stimulation;Iontophoresis 42m/ml Dexamethasone;Moist Heat;Traction;Ultrasound;Neuromuscular re-education;Therapeutic exercise;Therapeutic activities;Patient/family education;Manual techniques;Dry needling;Taping;Spinal Manipulations           Patient will benefit from skilled therapeutic intervention in order to improve the following deficits and impairments:  Pain,Decreased range of motion,Impaired UE functional use,Increased muscle spasms,Hypomobility  Visit Diagnosis: Radiculopathy, cervical region     Problem List Patient Active Problem List   Diagnosis Date Noted  . Diabetes [place] 2 in obese ([place] 01/17/2016  . Chronic neck pain 01/16/2015  . Palpitations 01/16/2015  . Anxiety state 01/16/2015  . Leukocytopenia 01/24/2009  . SKIN TAG 11/29/2007  . ANEMIA-IRON DEFICIENCY 04/26/2007  . HTN (hypertension) 04/26/2007  . GERD 01/04/2007    KSigurd Sos PT 08/07/20 11:01 AM PHYSICAL THERAPY DISCHARGE SUMMARY  Visits from Start of Care:  10  Current functional level related to goals / functional outcomes: See above for current status.  Pt had to cancel his final PT session.     Remaining deficits: See above for current status.  No significant limitations at this time.    Education / Equipment: HEP, posture/body mechanics  Plan: Patient agrees to discharge.  Patient goals were partially met. Patient is being discharged due to being pleased with the current functional level.  ?????        Sigurd Sos, PT 08/09/20 7:50 AM  [facility] [address] [place], [place], [address] Phone: [phone]   Fax:  [phone]  Name: Bryan Smith MRN: [id number] Date of Birth: [birth year]

## 2020-08-09 ENCOUNTER — Encounter: Payer: Medicare PPO | Admitting: Physical Therapy

## 2020-09-14 NOTE — Progress Notes (Signed)
[facility] OFFICE PROGRESS NOTE  Laurey Mor[address], [place] [id number]  DIAGNOSIS: Leukocytopenia/Anemia from frequent blood donation   PRIOR THERAPY: None  CURRENT THERAPY: Oral Iron Supplements  INTERVAL HISTORY: Bryan Smith 65 y.o. male returns to the clinic today for a follow-up visit.  The patient was last seen in the clinic on 07/04/2020.  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 He is asymptomatic and denies frequent infections. The patient also had new mild iron deficiency anemia which was likely secondary to frequent blood donations.  The patient has since stopped donating blood and has been taking iron supplements.  He has been compliant with this and takes 65 mg p.o. daily.  The patient is asymptomatic.  He denies any lightheadedness, chest pain, palpitations, pallor, dyspnea on exertion, fatigue, or decreased exercise tolerance.  He has not changed his dietary habits to increase his intake of iron rich food. The patient is here today for evaluation and repeat blood work.    MEDICAL HISTORY: Past Medical History:  Diagnosis Date   Allergy    ANEMIA-IRON DEFICIENCY 04/26/2007   Diabetes mellitus without complication ([place])    GERD 01/04/2007   Hypertension    LEUKOCYTOPENIA UNSPECIFIED 01/24/2009    ALLERGIES:  is allergic to lisinopril.  MEDICATIONS:  Current Outpatient Medications  Medication Sig Dispense Refill   esomeprazole (NEXIUM) 40 MG capsule Take 1 capsule (40 mg total) by mouth daily. 30 capsule 12   Ferrous Sulfate (IRON) 325 (65 Fe) MG TABS Take by mouth.     losartan (COZAAR) 50 MG tablet TAKE 1 TABLET BY MOUTH EVERY DAY 90 tablet 1   Multiple Vitamins-Minerals (CENTRUM SILVER PO) Take by mouth as needed.     promethazine (PHENERGAN) 25 MG tablet Take 1 tablet (25 mg total) by mouth every 6 (six) hours as needed for nausea or vomiting. (Patient not taking: Reported  on 09/17/2020) 20 tablet 2   Current Facility-Administered Medications  Medication Dose Route Frequency Provider Last Rate Last Admin   0.9 %  sodium chloride infusion  500 mL Intravenous Continuous Ladene Artist, MD        SURGICAL HISTORY:  Past Surgical History:  Procedure Laterality Date   COLONOSCOPY  11/19/2016   per Dr. Fuller Plan, adenomatous polyps, repeat in 5 yrs     REVIEW OF SYSTEMS:   Review of Systems  Constitutional: Negative for appetite change, chills, fatigue, fever and unexpected weight change.  HENT: Negative for mouth sores, nosebleeds, sore throat and trouble swallowing.   Eyes: Negative for eye problems and icterus.  Respiratory: Negative for cough, hemoptysis, shortness of breath and wheezing.   Cardiovascular: Negative for chest pain and leg swelling.  Gastrointestinal: Negative for abdominal pain, constipation, diarrhea, nausea and vomiting.  Genitourinary: Negative for bladder incontinence, difficulty urinating, dysuria, frequency and hematuria.   Musculoskeletal: Negative for back pain, gait problem, neck pain and neck stiffness.  Skin: Negative for itching and rash.  Neurological: Negative for dizziness, extremity weakness, gait problem, headaches, light-headedness and seizures.  Hematological: Negative for adenopathy. Does not bruise/bleed easily.  Psychiatric/Behavioral: Negative for confusion, depression and sleep disturbance. The patient is not nervous/anxious.     PHYSICAL EXAMINATION:  Blood pressure (!) 149/97, pulse 83, temperature 98.2 F (36.8 C), temperature source Oral, resp. rate 17, weight 224 lb 9.6 oz (101.9 kg), SpO2 100 %.  ECOG PERFORMANCE STATUS: 0 - Asymptomatic  Physical Exam  Constitutional: Oriented to  person, place, and time and well-developed, well-nourished, and in no distress. No distress.  HENT:  Head: Normocephalic and atraumatic.  Mouth/Throat: Oropharynx is clear and moist. No oropharyngeal exudate.  Eyes:  Conjunctivae are normal. Right eye exhibits no discharge. Left eye exhibits no discharge. No scleral icterus.  Neck: Normal range of motion. Neck supple.  Cardiovascular: Normal rate, regular rhythm, normal heart sounds and intact distal pulses.   Pulmonary/Chest: Effort normal and breath sounds normal. No respiratory distress. No wheezes. No rales.  Abdominal: Soft. Bowel sounds are normal. Exhibits no distension and no mass. There is no tenderness.  Musculoskeletal: Normal range of motion. Exhibits no edema.  Lymphadenopathy:    No cervical adenopathy.  Neurological: Alert and oriented to person, place, and time. Exhibits normal muscle tone. Gait normal. Coordination normal.  Skin: Skin is warm and dry. No rash noted. Not diaphoretic. No erythema. No pallor.  Psychiatric: Mood, memory and judgment normal.  Vitals reviewed.  LABORATORY DATA: Lab Results  Component Value Date   WBC 3.2 (L) 09/17/2020   HGB 14.2 09/17/2020   HCT 45.1 09/17/2020   MCV 78.3 (L) 09/17/2020   PLT 228 09/17/2020      Chemistry      Component Value Date/Time   NA 142 07/04/2020 1322   K 4.1 07/04/2020 1322   CL 108 07/04/2020 1322   CO2 26 07/04/2020 1322   BUN 11 07/04/2020 1322   CREATININE 1.30 (H) 07/04/2020 1322      Component Value Date/Time   CALCIUM 8.6 (L) 07/04/2020 1322   ALKPHOS 77 07/04/2020 1322   AST 20 07/04/2020 1322   ALT 16 07/04/2020 1322   BILITOT 0.3 07/04/2020 1322       RADIOGRAPHIC STUDIES:  No results found.   ASSESSMENT/PLAN:  This is a very pleasant 65 year old African-American male referred to the clinic for evaluation of anemia and leukocytopenia.  The patient's anemia was likely secondary to frequent blood donation.  The patient's leukocytopenia is likely reactive as his other studies were within normal limits and his leukocytopenia is mild and he is asymptomatic.  The patient had repeat lab studies including CBC, ferritin, and iron studies performed.  The  patient's labs today show improved Hbg to 14.2. His WBC continue to be low at 3.2. His MCV is low but is improving from 73.0 to 78.3. His iron studies are improved and within normal limits. His ferritin improved from 6 to 25.   I recommends that the patient continue taking his oral iron supplement.   We will see him back for follow-up visit in 3 months for evaluation for repeat CBC, iron studies, and ferritin.  The patient was advised to call immediately if he has any concerning symptoms in the interval. The patient voices understanding of current disease status and treatment options and is in agreement with the current care plan. All questions were answered. The patient knows to call the clinic with any problems, questions or concerns. We can certainly see the patient much sooner if necessary       Orders Placed This Encounter  Procedures   CBC with Differential ([facility] Only)    Standing Status:   Future    Standing Expiration Date:   09/17/2021   Ferritin    Standing Status:   Future    Standing Expiration Date:   09/17/2021   Iron and TIBC    Standing Status:   Future    Standing Expiration Date:   09/17/2021  The total time spent in the appointment was 20-29 minutes.   Lovelle Lema L Mansa Willers, PA-C 09/17/20

## 2020-09-17 ENCOUNTER — Inpatient Hospital Stay: Payer: Medicare PPO

## 2020-09-17 ENCOUNTER — Inpatient Hospital Stay: Payer: Medicare PPO | Attending: Physician Assistant | Admitting: Physician Assistant

## 2020-09-17 ENCOUNTER — Other Ambulatory Visit: Payer: Self-pay

## 2020-09-17 VITALS — BP 149/97 | HR 83 | Temp 98.2°F | Resp 17 | Wt 224.6 lb

## 2020-09-17 DIAGNOSIS — D509 Iron deficiency anemia, unspecified: Secondary | ICD-10-CM | POA: Insufficient documentation

## 2020-09-17 DIAGNOSIS — D72819 Decreased white blood cell count, unspecified: Secondary | ICD-10-CM | POA: Diagnosis not present

## 2020-09-17 LAB — CBC WITH DIFFERENTIAL (CANCER CENTER ONLY)
Abs Immature Granulocytes: 0.01 10*3/uL (ref 0.00–0.07)
Basophils Absolute: 0 10*3/uL (ref 0.0–0.1)
Basophils Relative: 1 %
Eosinophils Absolute: 0.1 10*3/uL (ref 0.0–0.5)
Eosinophils Relative: 3 %
HCT: 45.1 % (ref 39.0–52.0)
Hemoglobin: 14.2 g/dL (ref 13.0–17.0)
Immature Granulocytes: 0 %
Lymphocytes Relative: 26 %
Lymphs Abs: 0.8 10*3/uL (ref 0.7–4.0)
MCH: 24.7 pg — ABNORMAL LOW (ref 26.0–34.0)
MCHC: 31.5 g/dL (ref 30.0–36.0)
MCV: 78.3 fL — ABNORMAL LOW (ref 80.0–100.0)
Monocytes Absolute: 0.5 10*3/uL (ref 0.1–1.0)
Monocytes Relative: 14 %
Neutro Abs: 1.8 10*3/uL (ref 1.7–7.7)
Neutrophils Relative %: 56 %
Platelet Count: 228 10*3/uL (ref 150–400)
RBC: 5.76 MIL/uL (ref 4.22–5.81)
RDW: 19.7 % — ABNORMAL HIGH (ref 11.5–15.5)
WBC Count: 3.2 10*3/uL — ABNORMAL LOW (ref 4.0–10.5)
nRBC: 0 % (ref 0.0–0.2)

## 2020-09-17 LAB — IRON AND TIBC
Iron: 78 ug/dL (ref 42–163)
Saturation Ratios: 24 % (ref 20–55)
TIBC: 328 ug/dL (ref 202–409)
UIBC: 249 ug/dL (ref 117–376)

## 2020-09-17 LAB — FERRITIN: Ferritin: 25 ng/mL (ref 24–336)

## 2020-09-19 ENCOUNTER — Telehealth: Payer: Self-pay | Admitting: Internal Medicine

## 2020-09-19 NOTE — Telephone Encounter (Signed)
Scheduled per los. Called and left msg. Mailed printout  °

## 2020-10-13 ENCOUNTER — Encounter: Payer: Self-pay | Admitting: Family Medicine

## 2020-10-15 ENCOUNTER — Telehealth: Payer: Medicare PPO | Admitting: Family Medicine

## 2020-10-15 ENCOUNTER — Encounter: Payer: Self-pay | Admitting: Family Medicine

## 2020-10-15 VITALS — Wt 224.0 lb

## 2020-10-15 DIAGNOSIS — R197 Diarrhea, unspecified: Secondary | ICD-10-CM

## 2020-10-15 MED ORDER — DIPHENOXYLATE-ATROPINE 2.5-0.025 MG PO TABS: 2.0000 | ORAL_TABLET | Freq: Four times a day (QID) | ORAL | 0 refills | Status: DC | PRN

## 2020-10-15 MED ORDER — PROMETHAZINE HCL 25 MG PO TABS: 25.0000 mg | ORAL_TABLET | ORAL | 2 refills | Status: DC | PRN

## 2020-10-15 NOTE — Progress Notes (Signed)
Subjective:    Patient ID: Bryan Smith, male    DOB: Oct 27, 1955, 65 y.o.   MRN: [id number]  HPI Here for 4 days of watery diarrhea. He feels fine otherwise. No fever or abdominal pain. No nausea or vomiting. No ST or cough or body aches. He has tried Imodium but this has not helped much. He has not travelled recently. He has not visited anyone in a hospital or a nursing home recently. No recent antibiotics or medication changes.  Virtual Visit via Video Note  I connected with the patient on 10/15/20 at  3:45 PM EDT by a video enabled telemedicine application and verified that I am speaking with the correct person using two identifiers.  Location patient: home Location provider:work or home office Persons participating in the virtual visit: patient, provider  I discussed the limitations of evaluation and management by telemedicine and the availability of in person appointments. The patient expressed understanding and agreed to proceed.   HPI:    ROS: See pertinent positives and negatives per HPI.  Past Medical History:  Diagnosis Date   Allergy    ANEMIA-IRON DEFICIENCY 04/26/2007   Diabetes mellitus without complication ([place])    GERD 01/04/2007   Hypertension    LEUKOCYTOPENIA UNSPECIFIED 01/24/2009    Past Surgical History:  Procedure Laterality Date   COLONOSCOPY  11/19/2016   per Dr. Fuller Plan, adenomatous polyps, repeat in 5 yrs     Family History  Problem Relation Age of Onset   Arthritis Mother    Colon cancer Mother      Current Outpatient Medications:    diphenoxylate-atropine (LOMOTIL) 2.5-0.025 MG tablet, Take 2 tablets by mouth 4 (four) times daily as needed for diarrhea or loose stools., Disp: 60 tablet, Rfl: 0   esomeprazole (NEXIUM) 40 MG capsule, Take 1 capsule (40 mg total) by mouth daily., Disp: 30 capsule, Rfl: 12   Ferrous Sulfate (IRON) 325 (65 Fe) MG TABS, Take by mouth., Disp: , Rfl:    losartan (COZAAR) 50 MG tablet, TAKE 1 TABLET BY MOUTH  EVERY DAY, Disp: 90 tablet, Rfl: 1   Multiple Vitamins-Minerals (CENTRUM SILVER PO), Take by mouth as needed., Disp: , Rfl:    promethazine (PHENERGAN) 25 MG tablet, Take 1 tablet (25 mg total) by mouth every 4 (four) hours as needed for nausea or vomiting., Disp: 60 tablet, Rfl: 2  Current Facility-Administered Medications:    0.9 %  sodium chloride infusion, 500 mL, Intravenous, Continuous, Fuller Plan, Pricilla Riffle, MD  EXAM:  VITALS per patient if applicable:  GENERAL: alert, oriented, appears well and in no acute distress  HEENT: atraumatic, conjunttiva clear, no obvious abnormalities on inspection of external nose and ears  NECK: normal movements of the head and neck  LUNGS: on inspection no signs of respiratory distress, breathing rate appears normal, no obvious gross SOB, gasping or wheezing  CV: no obvious cyanosis  MS: moves all visible extremities without noticeable abnormality  PSYCH/NEURO: pleasant and cooperative, no obvious depression or anxiety, speech and thought processing grossly intact  ASSESSMENT AND PLAN: Diarrhea, likely of a viral origin. This should be self limited. He can try Lomotil as needed. He will follow up with [place] if this has not resolved on several days. I do not think this is due to a Covid-19 infection, but I asked him to test for this at home and then report back to [place].  Alysia Penna, MD  Discussed the following assessment and plan:  No diagnosis found.  I discussed the assessment and treatment plan with the patient. The patient was provided an opportunity to ask questions and all were answered. The patient agreed with the plan and demonstrated an understanding of the instructions.   The patient was advised to call back or seek an in-person evaluation if the symptoms worsen or if the condition fails to improve as anticipated.      Review of Systems     Objective:   Physical Exam        Assessment & Plan:

## 2020-10-15 NOTE — Telephone Encounter (Signed)
Done

## 2020-10-15 NOTE — Progress Notes (Signed)
   Subjective:    Patient ID: Bryan Smith, male    DOB: May 08, 1955, 65 y.o.   MRN: [id number]  HPI    Review of Systems     Objective:   Physical Exam        Assessment & Plan:

## 2020-11-07 ENCOUNTER — Other Ambulatory Visit: Payer: Self-pay | Admitting: Family Medicine

## 2020-11-13 DIAGNOSIS — H40012 Open angle with borderline findings, low risk, left eye: Secondary | ICD-10-CM | POA: Diagnosis not present

## 2020-11-20 ENCOUNTER — Encounter: Payer: Self-pay | Admitting: Family Medicine

## 2020-11-20 NOTE — Telephone Encounter (Signed)
Please advise 

## 2020-11-20 NOTE — Telephone Encounter (Signed)
No I would advise him to separate these out by 2 weeks to avoid duplicating any side effects. Get the flu shot first and then the Covid shot

## 2020-12-14 NOTE — Progress Notes (Signed)
[facility] OFF[facility] PROGRESS NOTE  Bryan Smith, [place] [id number]  DIAGNOSIS: Leukocytopenia/Anemia from frequent blood donation   PRIOR THERAPY: None   CURRENT THERAPY: Oral Iron Supplements  INTERVAL HISTORY: Bryan Smith TREAT 65 y.o. male returns to the clinic today for a follow-up visit.  The patient was first seen in the clinic on 07/04/2020.  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 He is asymptomatic and denies frequent infections. The patient also had new mild iron deficiency anemia which was likely secondary to frequent blood donations.  The patient has since stopped donating blood and has been taking iron supplements.  He has been compliant with this and takes 65 mg p.o. daily.  When he was seen for follow-up in July 2022, his anemia had improved.  The patient is asymptomatic.  He denies any lightheadedness, chest pain, palpitations, pallor, dyspnea on exertion, fatigue, or decreased exercise tolerance.  The patient is here today for evaluation and repeat blood work.   MEDICAL HISTORY: Past Medical History:  Diagnosis Date   Allergy    ANEMIA-IRON DEFICIENCY 04/26/2007   Diabetes mellitus without complication ([place])    GERD 01/04/2007   Hypertension    LEUKOCYTOPENIA UNSPECIFIED 01/24/2009    ALLERGIES:  is allergic to lisinopril.  MEDICATIONS:  Current Outpatient Medications  Medication Sig Dispense Refill   diphenoxylate-atropine (LOMOTIL) 2.5-0.025 MG tablet Take 2 tablets by mouth 4 (four) times daily as needed for diarrhea or loose stools. 60 tablet 0   esomeprazole (NEXIUM) 40 MG capsule Take 1 capsule (40 mg total) by mouth daily. 30 capsule 12   Ferrous Sulfate (IRON) 325 (65 Fe) MG TABS Take by mouth.     losartan (COZAAR) 50 MG tablet TAKE 1 TABLET BY MOUTH EVERY DAY 90 tablet 1   Multiple Vitamins-Minerals (CENTRUM SILVER PO) Take by mouth as needed.     promethazine  (PHENERGAN) 25 MG tablet Take 1 tablet (25 mg total) by mouth every 4 (four) hours as needed for nausea or vomiting. 60 tablet 2   Current Facility-Administered Medications  Medication Dose Route Frequency Provider Last Rate Last Admin   0.9 %  sodium chloride infusion  500 mL Intravenous Continuous Ladene Artist, MD        SURGICAL HISTORY:  Past Surgical History:  Procedure Laterality Date   COLONOSCOPY  11/19/2016   per Dr. Fuller Plan, adenomatous polyps, repeat in 5 yrs     REVIEW OF SYSTEMS:   Review of Systems  Constitutional: Negative for appetite change, chills, fatigue, fever and unexpected weight change.  HENT:   Negative for mouth sores, nosebleeds, sore throat and trouble swallowing.   Eyes: Negative for eye problems and icterus.  Respiratory: Negative for cough, hemoptysis, shortness of breath and wheezing.   Cardiovascular: Negative for chest pain and leg swelling.  Gastrointestinal: Negative for abdominal pain, constipation, diarrhea, nausea and vomiting.  Genitourinary: Negative for bladder incontinence, difficulty urinating, dysuria, frequency and hematuria.   Musculoskeletal: Negative for back pain, gait problem, neck pain and neck stiffness.  Skin: Negative for itching and rash.  Neurological: Negative for dizziness, extremity weakness, gait problem, headaches, light-headedness and seizures.  Hematological: Negative for adenopathy. Does not bruise/bleed easily.  Psychiatric/Behavioral: Negative for confusion, depression and sleep disturbance. The patient is not nervous/anxious.     PHYSICAL EXAMINATION:  Blood pressure (!) 134/92, pulse 69, temperature (!) 97 F (36.1 C), temperature source Tympanic, resp. rate 19, weight 223  lb 5 oz (101.3 kg), SpO2 100 %.  ECOG PERFORMANCE STATUS: 1  Physical Exam  Constitutional: Oriented to person, place, and time and well-developed, well-nourished, and in no distress.  HENT:  Head: Normocephalic and atraumatic.   Mouth/Throat: Oropharynx is clear and moist. No oropharyngeal exudate.  Eyes: Conjunctivae are normal. Right eye exhibits no discharge. Left eye exhibits no discharge. No scleral icterus.  Neck: Normal range of motion. Neck supple.  Cardiovascular: Normal rate, regular rhythm, normal heart sounds and intact distal pulses.   Pulmonary/Chest: Effort normal and breath sounds normal. No respiratory distress. No wheezes. No rales.  Abdominal: Soft. Bowel sounds are normal. Exhibits no distension and no mass. There is no tenderness.  Musculoskeletal: Normal range of motion. Exhibits no edema.  Lymphadenopathy:    No cervical adenopathy.  Neurological: Alert and oriented to person, place, and time. Exhibits normal muscle tone. Gait normal. Coordination normal.  Skin: Skin is warm and dry. No rash noted. Not diaphoretic. No erythema. No pallor.  Psychiatric: Mood, memory and judgment normal.  Vitals reviewed.  LABORATORY DATA: Lab Results  Component Value Date   WBC 3.7 (L) 12/20/2020   HGB 15.0 12/20/2020   HCT 47.0 12/20/2020   MCV 81.5 12/20/2020   PLT 255 12/20/2020      Chemistry      Component Value Date/Time   NA 142 07/04/2020 1322   K 4.1 07/04/2020 1322   CL 108 07/04/2020 1322   CO2 26 07/04/2020 1322   BUN 11 07/04/2020 1322   CREATININE 1.30 (H) 07/04/2020 1322      Component Value Date/Time   CALCIUM 8.6 (L) 07/04/2020 1322   ALKPHOS 77 07/04/2020 1322   AST 20 07/04/2020 1322   ALT 16 07/04/2020 1322   BILITOT 0.3 07/04/2020 1322       RADIOGRAPHIC STUDIES:  No results found.   ASSESSMENT/PLAN:  This is a very pleasant 65 year old African-American male referred to the clinic for evaluation of anemia and leukocytopenia.  The patient's anemia was likely secondary to frequent blood donation.  The patient's leukocytopenia is likely reactive as his other studies were within normal limits and his leukocytopenia is mild and he is asymptomatic without frequent  infections.  The patient had repeat lab studies including CBC, ferritin, and iron studies performed.  The patient's labs today show improved Hbg to 15.0. His WBC continues to be slightly low at 3.7. His MCV is improved to 81.5. His iron studies are pending.    I recommends that the patient continue taking his oral iron supplement.    I will call him once I have the results back to determine when his next follow up visit is needed, if any. We will also let him know when it is safe to donate blood based on his iron studies.   The patient's blood pressure was slightly high today which he is concerned about. We will recheck it. If he has continued elevated blood pressure not to goal, advised the patient to follow up with his PCP for adjusted BP management.   The patient was advised to call immediately if he has any concerning symptoms in the interval. The patient voices understanding of current disease status and treatment options and is in agreement with the current care plan. All questions were answered. The patient knows to call the clinic with any problems, questions or concerns. We can certainly see the patient much sooner if necessary      No orders of the defined types were  placed in this encounter.     The total time spent in the appointment was 20-29 minutes.   Dorine Duffey L Chancy Claros, PA-C 12/20/20

## 2020-12-20 ENCOUNTER — Inpatient Hospital Stay: Payer: Medicare PPO | Attending: Physician Assistant | Admitting: Physician Assistant

## 2020-12-20 ENCOUNTER — Inpatient Hospital Stay: Payer: Medicare PPO

## 2020-12-20 ENCOUNTER — Other Ambulatory Visit: Payer: Self-pay

## 2020-12-20 VITALS — BP 134/92 | HR 69 | Temp 97.0°F | Resp 19 | Wt 223.3 lb

## 2020-12-20 DIAGNOSIS — D72819 Decreased white blood cell count, unspecified: Secondary | ICD-10-CM | POA: Diagnosis not present

## 2020-12-20 DIAGNOSIS — D509 Iron deficiency anemia, unspecified: Secondary | ICD-10-CM | POA: Insufficient documentation

## 2020-12-20 LAB — CBC WITH DIFFERENTIAL (CANCER CENTER ONLY)
Abs Immature Granulocytes: 0.01 10*3/uL (ref 0.00–0.07)
Basophils Absolute: 0 10*3/uL (ref 0.0–0.1)
Basophils Relative: 1 %
Eosinophils Absolute: 0.1 10*3/uL (ref 0.0–0.5)
Eosinophils Relative: 2 %
HCT: 47 % (ref 39.0–52.0)
Hemoglobin: 15 g/dL (ref 13.0–17.0)
Immature Granulocytes: 0 %
Lymphocytes Relative: 31 %
Lymphs Abs: 1.1 10*3/uL (ref 0.7–4.0)
MCH: 26 pg (ref 26.0–34.0)
MCHC: 31.9 g/dL (ref 30.0–36.0)
MCV: 81.5 fL (ref 80.0–100.0)
Monocytes Absolute: 0.6 10*3/uL (ref 0.1–1.0)
Monocytes Relative: 17 %
Neutro Abs: 1.8 10*3/uL (ref 1.7–7.7)
Neutrophils Relative %: 49 %
Platelet Count: 255 10*3/uL (ref 150–400)
RBC: 5.77 MIL/uL (ref 4.22–5.81)
RDW: 16.5 % — ABNORMAL HIGH (ref 11.5–15.5)
WBC Count: 3.7 10*3/uL — ABNORMAL LOW (ref 4.0–10.5)
nRBC: 0 % (ref 0.0–0.2)

## 2020-12-21 LAB — IRON AND TIBC
Iron: 75 ug/dL (ref 45–182)
Saturation Ratios: 21 % (ref 17.9–39.5)
TIBC: 349 ug/dL (ref 250–450)
UIBC: 274 ug/dL

## 2020-12-21 LAB — FERRITIN: Ferritin: 59 ng/mL (ref 24–336)

## 2020-12-24 ENCOUNTER — Other Ambulatory Visit: Payer: Self-pay | Admitting: Physician Assistant

## 2020-12-24 ENCOUNTER — Telehealth: Payer: Self-pay | Admitting: Physician Assistant

## 2020-12-24 ENCOUNTER — Telehealth: Payer: Self-pay | Admitting: Medical Oncology

## 2020-12-24 DIAGNOSIS — D509 Iron deficiency anemia, unspecified: Secondary | ICD-10-CM

## 2020-12-24 NOTE — Telephone Encounter (Signed)
Scheduled per sch msg. Called and left msg  

## 2020-12-24 NOTE — Telephone Encounter (Signed)
I attempted to call the patient on his home and mobile number.  Unable to leave a voicemail on his cell phone number.  I left a voicemail on his home phone. His iron studies are within normal limits.  I gave him the option of being released to his primary doctor as his anemia was caused by to frequent blood donations at the [facility] and has corrected with stopping blood donation and taking iron supplements.  Otherwise, we be happy to see him again in the next 3 to 6 months if he desires.  Discussed that he should only donate his blood no more than 2-3 times per year.  Pending a call back regarding his decision and if he would like me to arrange a follow-up appointment in the next 3 to 6 months.

## 2020-12-24 NOTE — Telephone Encounter (Signed)
I was returning the patient's call but was once again unable to reach him.  I left a detailed voicemail with his iron studies results.  I also discussed that if he would like to see his values from his iron studies that his results are available on MyChart.  He requested a 78-month follow-up visit which I have sent a scheduling message for labs and follow-up visit in 6 months.  If he has any further questions he can call us back.

## 2020-12-24 NOTE — Telephone Encounter (Signed)
Requesting results iron studies and wants to do 6 month appts

## 2021-02-04 ENCOUNTER — Encounter: Payer: Self-pay | Admitting: Family Medicine

## 2021-02-05 NOTE — Telephone Encounter (Signed)
Set up an OV with me to discuss this. In the meantime tell him to avoid NSAIDs and caffeine if possible

## 2021-02-13 ENCOUNTER — Encounter: Payer: Self-pay | Admitting: Family Medicine

## 2021-02-13 ENCOUNTER — Ambulatory Visit: Payer: Medicare PPO | Admitting: Family Medicine

## 2021-02-13 VITALS — BP 130/80 | HR 54 | Temp 98.1°F | Wt 223.0 lb

## 2021-02-13 DIAGNOSIS — H9313 Tinnitus, bilateral: Secondary | ICD-10-CM | POA: Diagnosis not present

## 2021-02-13 NOTE — Progress Notes (Signed)
   Subjective:    Patient ID: Bryan Smith, male    DOB: 03-25-1955, 65 y.o.   MRN: [id number]  HPI Here for the sudden onset of a high pitched ringing in both ears about 2 weeks ago. No recent exposure to loud noise or head trauma. No change in medications. He uses very little caffeine or NSAID's. No pain or dizziness. No hearing loss. No fever or signs of infection anywhere.    Review of Systems  Constitutional: Negative.   HENT:  Positive for tinnitus. Negative for congestion, ear pain, facial swelling, hearing loss, postnasal drip and sinus pressure.   Eyes: Negative.   Respiratory: Negative.    Cardiovascular: Negative.   Neurological: Negative.       Objective:   Physical Exam Constitutional:      General: He is not in acute distress.    Appearance: Normal appearance.  HENT:     Head: Normocephalic and atraumatic.     Right Ear: Tympanic membrane, ear canal and external ear normal.     Left Ear: Tympanic membrane, ear canal and external ear normal.     Nose: Nose normal.     Mouth/Throat:     Pharynx: Oropharynx is clear.  Eyes:     Conjunctiva/sclera: Conjunctivae normal.  Cardiovascular:     Rate and Rhythm: Normal rate and regular rhythm.     Pulses: Normal pulses.     Heart sounds: Normal heart sounds.  Pulmonary:     Effort: Pulmonary effort is normal.     Breath sounds: Normal breath sounds.  Lymphadenopathy:     Cervical: No cervical adenopathy.  Neurological:     General: No focal deficit present.     Mental Status: He is alert and oriented to person, place, and time.          Assessment & Plan:  Sudden onset of bilateral tinnitus. We will refer him to ENT.  Alysia Penna, MD

## 2021-02-15 ENCOUNTER — Encounter: Payer: Self-pay | Admitting: Family Medicine

## 2021-02-15 NOTE — Telephone Encounter (Signed)
Yes I did the referral

## 2021-02-15 NOTE — Addendum Note (Signed)
Addended by: Alysia Penna A on: 02/15/2021 03:53 PM   Modules accepted: Orders

## 2021-05-02 ENCOUNTER — Other Ambulatory Visit: Payer: Self-pay | Admitting: Family Medicine

## 2021-05-02 DIAGNOSIS — I10 Essential (primary) hypertension: Secondary | ICD-10-CM

## 2021-05-21 ENCOUNTER — Ambulatory Visit: Payer: Medicare PPO

## 2021-05-28 ENCOUNTER — Ambulatory Visit (INDEPENDENT_AMBULATORY_CARE_PROVIDER_SITE_OTHER): Payer: Medicare PPO

## 2021-05-28 VITALS — BP 128/78 | HR 90 | Temp 98.5°F | Ht 70.0 in | Wt 227.6 lb

## 2021-05-28 DIAGNOSIS — Z0000 Encounter for general adult medical examination without abnormal findings: Secondary | ICD-10-CM | POA: Diagnosis not present

## 2021-05-28 NOTE — Patient Instructions (Signed)
Mr. Purves , ?Thank you for taking time to come for your Medicare Wellness Visit. I appreciate your ongoing commitment to your health goals. Please review the following plan we discussed and let me know if I can assist you in the future.  ? ?Screening recommendations/referrals: ?Colonoscopy: completed 11/19/2016, due 11/19/2021 ?Recommended yearly ophthalmology/optometry visit for glaucoma screening and checkup ?Recommended yearly dental visit for hygiene and checkup ? ?Vaccinations: ?Influenza vaccine: completed 11/22/2020, due next flu season ?Pneumococcal vaccine: completed 06/27/2020 ?Tdap vaccine: completed 06/27/2020, due 06/28/2030 ?Shingles vaccine: completed   ?Covid-19:  12/05/2020, 12/30/2019, 05/03/2019, 04/02/2019 ? ?Advanced directives: copy in chart ? ?Conditions/risks identified: none ? ?Next appointment: Follow up in one year for your annual wellness visit.  ? ?Preventive Care 70 Years and Older, Male ?Preventive care refers to lifestyle choices and visits with your health care provider that can promote health and wellness. ?What does preventive care include? ?A yearly physical exam. This is also called an annual well check. ?Dental exams once or twice a year. ?Routine eye exams. Ask your health care provider how often you should have your eyes checked. ?Personal lifestyle choices, including: ?Daily care of your teeth and gums. ?Regular physical activity. ?Eating a healthy diet. ?Avoiding tobacco and drug use. ?Limiting alcohol use. ?Practicing safe sex. ?Taking low doses of aspirin every day. ?Taking vitamin and mineral supplements as recommended by your health care provider. ?What happens during an annual well check? ?The services and screenings done by your health care provider during your annual well check will depend on your age, overall health, lifestyle risk factors, and family history of disease. ?Counseling  ?Your health care provider may ask you questions about your: ?Alcohol use. ?Tobacco  use. ?Drug use. ?Emotional well-being. ?Home and relationship well-being. ?Sexual activity. ?Eating habits. ?History of falls. ?Memory and ability to understand (cognition). ?Work and work Statistician. ?Screening  ?You may have the following tests or measurements: ?Height, weight, and BMI. ?Blood pressure. ?Lipid and cholesterol levels. These may be checked every 5 years, or more frequently if you are over 34 years old. ?Skin check. ?Lung cancer screening. You may have this screening every year starting at age 56 if you have a 30-pack-year history of smoking and currently smoke or have quit within the past 15 years. ?Fecal occult blood test (FOBT) of the stool. You may have this test every year starting at age 80. ?Flexible sigmoidoscopy or colonoscopy. You may have a sigmoidoscopy every 5 years or a colonoscopy every 10 years starting at age 56. ?Prostate cancer screening. Recommendations will vary depending on your family history and other risks. ?Hepatitis C blood test. ?Hepatitis B blood test. ?Sexually transmitted disease (STD) testing. ?Diabetes screening. This is done by checking your blood sugar (glucose) after you have not eaten for a while (fasting). You may have this done every 1-3 years. ?Abdominal aortic aneurysm (AAA) screening. You may need this if you are a current or former smoker. ?Osteoporosis. You may be screened starting at age 3 if you are at high risk. ?Talk with your health care provider about your test results, treatment options, and if necessary, the need for more tests. ?Vaccines  ?Your health care provider may recommend certain vaccines, such as: ?Influenza vaccine. This is recommended every year. ?Tetanus, diphtheria, and acellular pertussis (Tdap, Td) vaccine. You may need a Td booster every 10 years. ?Zoster vaccine. You may need this after age 5. ?Pneumococcal 13-valent conjugate (PCV13) vaccine. One dose is recommended after age 72. ?Pneumococcal polysaccharide (PPSV23) vaccine.  One dose is recommended after age 65. ?Talk to your health care provider about which screenings and vaccines you need and how often you need them. ?This information is not intended to replace advice given to you by your health care provider. Make sure you discuss any questions you have with your health care provider. ?Document Released: 03/23/2015 Document Revised: 11/14/2015 Document Reviewed: 12/26/2014 ?Elsevier Interactive Patient Education ? 2017 [employer]. ? ?Fall Prevention in the Home ?Falls can cause injuries. They can happen to people of all ages. There are many things you can do to make your home safe and to help prevent falls. ?What can I do on the outside of my home? ?Regularly fix the edges of walkways and driveways and fix any cracks. ?Remove anything that might make you trip as you walk through a door, such as a raised step or threshold. ?Trim any bushes or trees on the path to your home. ?Use bright outdoor lighting. ?Clear any walking paths of anything that might make someone trip, such as rocks or tools. ?Regularly check to see if handrails are loose or broken. Make sure that both sides of any steps have handrails. ?Any raised decks and porches should have guardrails on the edges. ?Have any leaves, snow, or ice cleared regularly. ?Use sand or salt on walking paths during winter. ?Clean up any spills in your garage right away. This includes oil or grease spills. ?What can I do in the bathroom? ?Use night lights. ?Install grab bars by the toilet and in the tub and shower. Do not use towel bars as grab bars. ?Use non-skid mats or decals in the tub or shower. ?If you need to sit down in the shower, use a plastic, non-slip stool. ?Keep the floor dry. Clean up any water that spills on the floor as soon as it happens. ?Remove soap buildup in the tub or shower regularly. ?Attach bath mats securely with double-sided non-slip rug tape. ?Do not have throw rugs and other things on the floor that can make  you trip. ?What can I do in the bedroom? ?Use night lights. ?Make sure that you have a light by your bed that is easy to reach. ?Do not use any sheets or blankets that are too big for your bed. They should not hang down onto the floor. ?Have a firm chair that has side arms. You can use this for support while you get dressed. ?Do not have throw rugs and other things on the floor that can make you trip. ?What can I do in the kitchen? ?Clean up any spills right away. ?Avoid walking on wet floors. ?Keep items that you use a lot in easy-to-reach places. ?If you need to reach something above you, use a strong step stool that has a grab bar. ?Keep electrical cords out of the way. ?Do not use floor polish or wax that makes floors slippery. If you must use wax, use non-skid floor wax. ?Do not have throw rugs and other things on the floor that can make you trip. ?What can I do with my stairs? ?Do not leave any items on the stairs. ?Make sure that there are handrails on both sides of the stairs and use them. Fix handrails that are broken or loose. Make sure that handrails are as long as the stairways. ?Check any carpeting to make sure that it is firmly attached to the stairs. Fix any carpet that is loose or worn. ?Avoid having throw rugs at the top or bottom of the  stairs. If you do have throw rugs, attach them to the floor with carpet tape. ?Make sure that you have a light switch at the top of the stairs and the bottom of the stairs. If you do not have them, ask someone to add them for you. ?What else can I do to help prevent falls? ?Wear shoes that: ?Do not have high heels. ?Have rubber bottoms. ?Are comfortable and fit you well. ?Are closed at the toe. Do not wear sandals. ?If you use a stepladder: ?Make sure that it is fully opened. Do not climb a closed stepladder. ?Make sure that both sides of the stepladder are locked into place. ?Ask someone to hold it for you, if possible. ?Clearly mark and make sure that you can  see: ?Any grab bars or handrails. ?First and last steps. ?Where the edge of each step is. ?Use tools that help you move around (mobility aids) if they are needed. These include: ?Canes. ?Walkers. ?Scooters. ?Crutche

## 2021-05-28 NOTE — Progress Notes (Signed)
?This visit occurred during the SARS-CoV-2 public health emergency.  Safety protocols were in place, including screening questions prior to the visit, additional usage of staff PPE, and extensive cleaning of exam room while observing appropriate contact time as indicated for disinfecting solutions. ? ?Subjective:  ? Bryan Smith is a 66 y.o. male who presents for Medicare Annual/Subsequent preventive examination. ? ?Review of Systems    ? ?Cardiac Risk Factors include: diabetes mellitus;advanced age (>63mn, >>70women);hypertension;obesity (BMI >30kg/m2) ? ?   ?Objective:  ?  ?Today's Vitals  ? 05/28/21 1323 05/28/21 1328  ?BP: 128/78   ?Pulse: 90   ?Temp: 98.5 ?F (36.9 ?C)   ?TempSrc: Oral   ?SpO2: 97%   ?Weight: 227 lb 9.6 oz (103.2 kg)   ?Height: '5\' 10"'$  (1.778 m)   ?PainSc:  2   ? ?Body mass index is 32.66 kg/m?. ? ?Advanced Directives 05/28/2021 06/07/2020 05/15/2020 06/10/2019 11/19/2016 11/05/2016 02/05/2016  ?Does Patient Have a Medical Advance Directive? Yes No No No No No Yes  ?Type of AParamedicof ALadueLiving will - - - - - -  ?Does patient want to make changes to medical advance directive? - - - - - - No - Patient declined  ?Copy of HKilain Chart? Yes - validated most recent copy scanned in chart (See row information) - - - - - -  ?Would patient like information on creating a medical advance directive? - No - Patient declined Yes (MAU/Ambulatory/Procedural Areas - Information given) - - - -  ? ? ?Current Medications (verified) ?Outpatient Encounter Medications as of 05/28/2021  ?Medication Sig  ? esomeprazole (NEXIUM) 40 MG capsule Take 1 capsule (40 mg total) by mouth daily.  ? Ferrous Sulfate (IRON) 325 (65 Fe) MG TABS Take by mouth.  ? losartan (COZAAR) 50 MG tablet TAKE 1 TABLET BY MOUTH EVERY DAY  ? Multiple Vitamins-Minerals (CENTRUM SILVER PO) Take by mouth as needed.  ? promethazine (PHENERGAN) 25 MG tablet Take 1 tablet (25 mg total) by mouth  every 4 (four) hours as needed for nausea or vomiting.  ? diphenoxylate-atropine (LOMOTIL) 2.5-0.025 MG tablet Take 2 tablets by mouth 4 (four) times daily as needed for diarrhea or loose stools.  ? ?Facility-Administered Encounter Medications as of 05/28/2021  ?Medication  ? 0.9 %  sodium chloride infusion  ? ? ?Allergies (verified) ?Lisinopril  ? ?History: ?Past Medical History:  ?Diagnosis Date  ? Allergy   ? ANEMIA-IRON DEFICIENCY 04/26/2007  ? Diabetes mellitus without complication ([facility]   ? GERD 01/04/2007  ? Hypertension   ? LEUKOCYTOPENIA UNSPECIFIED 01/24/2009  ? ?Past Surgical History:  ?Procedure Laterality Date  ? COLONOSCOPY  11/19/2016  ? per Dr. SFuller Plan adenomatous polyps, repeat in 5 yrs   ? ?Family History  ?Problem Relation Age of Onset  ? Arthritis Mother   ? Colon cancer Mother   ? ?Social History  ? ?Socioeconomic History  ? Marital status: Single  ?  Spouse name: Not on file  ? Number of children: Not on file  ? Years of education: Not on file  ? Highest education level: Bachelor's degree (e.g., BA, AB, BS)  ?Occupational History  ? Occupation: retired  ?Tobacco Use  ? Smoking status: Never  ? Smokeless tobacco: Never  ?Vaping Use  ? Vaping Use: Never used  ?Substance and Sexual Activity  ? Alcohol use: No  ?  Alcohol/week: 0.0 standard drinks  ? Drug use: No  ? Sexual activity: Not on file  ?  Other Topics Concern  ? Not on file  ?Social History Narrative  ? Not on file  ? ?Social Determinants of Health  ? ?Financial Resource Strain: Low Risk   ? Difficulty of Paying Living Expenses: Not hard at all  ?Food Insecurity: No Food Insecurity  ? Worried About Charity fundraiser in the Last Year: Never true  ? Ran Out of Food in the Last Year: Never true  ?Transportation Needs: No Transportation Needs  ? Lack of Transportation (Medical): No  ? Lack of Transportation (Non-Medical): No  ?Physical Activity: Sufficiently Active  ? Days of Exercise per Week: 4 days  ? Minutes of Exercise per Session: 60 min   ?Stress: No Stress Concern Present  ? Feeling of Stress : Not at all  ?Social Connections: Moderately Integrated  ? Frequency of Communication with Friends and Family: Three times a week  ? Frequency of Social Gatherings with Friends and Family: Twice a week  ? Attends Religious Services: More than 4 times per year  ? Active Member of Clubs or Organizations: Yes  ? Attends Archivist Meetings: More than 4 times per year  ? Marital Status: Never married  ? ? ?Tobacco Counseling ?Counseling given: Not Answered ? ? ?Clinical Intake: ? ?Pre-visit preparation completed: Yes ? ?Pain : 0-10 ?Pain Score: 2  ?Pain Type: Acute pain ?Pain Location: Hip ?Pain Orientation: Left ?Pain Descriptors / Indicators: Sore ?Pain Onset: In the past 7 days ?Pain Frequency: Intermittent ? ?  ? ?Nutritional Status: BMI > 30  Obese ?Nutritional Risks: None ?Diabetes: Yes ? ?How often do you need to have someone help you when you read instructions, pamphlets, or other written materials from your doctor or pharmacy?: 1 - Never ?What is the last grade level you completed in school?: some graduate ? ?Diabetic? Yes ?Nutrition Risk Assessment: ? ?Has the patient had any N/V/D within the last 2 months?  No  ?Does the patient have any non-healing wounds?  No  ?Has the patient had any unintentional weight loss or weight gain?  No  ? ?Diabetes: ? ?Is the patient diabetic?  Yes  ?If diabetic, was a CBG obtained today?  No  ?Did the patient bring in their glucometer from home?  No  ?How often do you monitor your CBG's? Does not.  ? ?Financial Strains and Diabetes Management: ? ?Are you having any financial strains with the device, your supplies or your medication? No .  ?Does the patient want to be seen by Chronic Care Management for management of their diabetes?  No  ?Would the patient like to be referred to a Nutritionist or for Diabetic Management?  No  ? ?Diabetic Exams: ? ?Diabetic Eye Exam: Completed 2022 ?Diabetic Foot Exam: Overdue, Pt  has been advised about the importance in completing this exam. Pt is scheduled for diabetic foot exam on next appointment. ? ? ?Interpreter Needed?: No ? ?Information entered by :: NAllen LPN ? ? ?Activities of Daily Living ?In your present state of health, do you have any difficulty performing the following activities: 05/28/2021  ?Hearing? N  ?Vision? N  ?Difficulty concentrating or making decisions? N  ?Walking or climbing stairs? N  ?Dressing or bathing? N  ?Doing errands, shopping? N  ?Preparing Food and eating ? N  ?Using the Toilet? N  ?In the past six months, have you accidently leaked urine? N  ?Do you have problems with loss of bowel control? N  ?Managing your Medications? N  ?Managing your Finances? N  ?  Housekeeping or managing your Housekeeping? N  ?Some recent data might be hidden  ? ? ?Patient Care Team: ?Laurey Morale, MD as PCP - General ? ?Indicate any recent Medical Services you may have received from other than [facility] providers in the past year (date may be approximate). ? ?   ?Assessment:  ? This is a routine wellness examination for Jaziah. ? ?Hearing/Vision screen ?Vision Screening - Comments:: Regular eye exams, Dr. Peter Garter ? ?Dietary issues and exercise activities discussed: ?Current Exercise Habits: Home exercise routine, Type of exercise: treadmill;strength training/weights, Time (Minutes): 60, Frequency (Times/Week): 4, Weekly Exercise (Minutes/Week): 240 ? ? Goals Addressed   ? ?  ?  ?  ?  ? This Visit's Progress  ?  Patient Stated     ?  05/28/2021, lose some inches around the waist ?  ? ?  ? ?Depression Screen ?PHQ 2/9 Scores 05/28/2021 06/27/2020 05/15/2020 04/06/2018 02/19/2016 02/12/2016 02/05/2016  ?PHQ - 2 Score 0 0 1 0 0 0 0  ?PHQ- 9 Score - 1 - - - - -  ?  ?Fall Risk ?Fall Risk  05/28/2021 06/27/2020 05/15/2020 04/06/2018 02/19/2016  ?Falls in the past year? 0 0 0 0 No  ?Number falls in past yr: - 0 0 - -  ?Injury with Fall? - 0 0 - -  ?Risk for fall due to : Medication side effect No Fall  Risks Impaired vision - -  ?Follow up Falls evaluation completed;Education provided;Falls prevention discussed - Falls prevention discussed - -  ? ? ?FALL RISK PREVENTION PERTAINING TO THE HOME: ? ?Any stairs

## 2021-06-18 ENCOUNTER — Encounter: Payer: Self-pay | Admitting: Family Medicine

## 2021-06-19 ENCOUNTER — Ambulatory Visit: Payer: Medicare PPO | Admitting: Family Medicine

## 2021-06-19 ENCOUNTER — Encounter: Payer: Self-pay | Admitting: Family Medicine

## 2021-06-19 VITALS — BP 138/86 | HR 85 | Temp 98.9°F | Wt 225.0 lb

## 2021-06-19 DIAGNOSIS — M25552 Pain in left hip: Secondary | ICD-10-CM

## 2021-06-19 DIAGNOSIS — G8929 Other chronic pain: Secondary | ICD-10-CM

## 2021-06-19 MED ORDER — DICLOFENAC SODIUM 75 MG PO TBEC: 75.0000 mg | DELAYED_RELEASE_TABLET | Freq: Two times a day (BID) | ORAL | 2 refills | Status: DC | PRN

## 2021-06-19 NOTE — Progress Notes (Signed)
? ?  Subjective:  ? ? Patient ID: Bryan Smith, male    DOB: 07-Sep-1955, 66 y.o.   MRN: [id number] ? ?HPI ?Here for worsening left hip pain. No hx of trauma. This began about 15 years ago, but it was mild and intermittent. Now for the past 10 days it has been worse and more persistent. No back pain. No radiation down the leg. When he runs on a treadmill or plays tennis, this gets worse. Taking Ibuprofen or aspirin helps.  ? ROS ?CV clear  ?Resp clear  ? ? ?   ?Objective:  ? Physical Exam ?Constitutional:   ?   General: He is not in acute distress. ?   Appearance: Normal appearance.  ?Cardiovascular:  ?   Rate and Rhythm: Normal rate and regular rhythm.  ?   Pulses: Normal pulses.  ?   Heart sounds: Normal heart sounds.  ?Pulmonary:  ?   Effort: Pulmonary effort is normal.  ?   Breath sounds: Normal breath sounds.  ?Musculoskeletal:  ?   Comments: The left hip is normal on exam. No tenderness and full ROM without pain. He does have some pain on crouching down   ?Neurological:  ?   Mental Status: He is alert.  ? ? ? ? ? ?   ?Assessment & Plan:  ?Left hip pain, likely some early arthritis. He will exercise on an eliptical machine or stationary bike rather than running. Try Diclofenac BID as needed.  ?Alysia Penna, MD ? ? ?

## 2021-06-20 NOTE — Progress Notes (Signed)
[facility] ?OFFICE PROGRESS NOTE ? ?Laurey Morale, MD ?[place] ?[place] [address] ? ?DIAGNOSIS: Leukocytopenia/Anemia from frequent blood donation  ? ?PRIOR THERAPY: None ? ?CURRENT THERAPY: Oral Iron Supplements ? ?INTERVAL HISTORY: ?Bryan Smith 66 y.o. male returns to the clinic today for a follow-up visit.  The patient was first seen in the clinic on 07/04/2020.  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 He is asymptomatic and denies frequent infections. The patient also had new mild iron deficiency anemia which was likely secondary to frequent blood donations. The patient has since stopped donating blood and has been taking iron supplements.  He has been compliant with this and takes 65 mg p.o. daily.  When he was seen for follow-up in July 2022, his anemia had improved.  The patient is asymptomatic. His only complaint today is hip pain for which he saw his PCP last week. He denies any lightheadedness, chest pain, palpitations, pallor, dyspnea on exertion, fatigue, or decreased exercise tolerance. We previously recommended limiting his blood donation to 2-3x per year at the most. He was given the option to follow with his PCP or follow every 6 months at our clinic. He opted to follow with our clinic.  The patient is here today for evaluation and repeat blood work. ? ? ? ? ?MEDICAL HISTORY: ?Past Medical History:  ?Diagnosis Date  ? Allergy   ? ANEMIA-IRON DEFICIENCY 04/26/2007  ? Diabetes mellitus without complication ([place])   ? GERD 01/04/2007  ? Hypertension   ? LEUKOCYTOPENIA UNSPECIFIED 01/24/2009  ? ? ?ALLERGIES:  is allergic to lisinopril. ? ?MEDICATIONS:  ?Current Outpatient Medications  ?Medication Sig Dispense Refill  ? diclofenac (VOLTAREN) 75 MG EC tablet Take 1 tablet (75 mg total) by mouth 2 (two) times daily as needed for mild pain. 60 tablet 2  ? esomeprazole (NEXIUM) 40 MG capsule Take 1 capsule (40 mg total) by  mouth daily. 30 capsule 12  ? Ferrous Sulfate (IRON) 325 (65 Fe) MG TABS Take by mouth.    ? losartan (COZAAR) 50 MG tablet TAKE 1 TABLET BY MOUTH EVERY DAY 90 tablet 0  ? Multiple Vitamins-Minerals (CENTRUM SILVER PO) Take by mouth as needed.    ? promethazine (PHENERGAN) 25 MG tablet Take 1 tablet (25 mg total) by mouth every 4 (four) hours as needed for nausea or vomiting. 60 tablet 2  ? ?Current Facility-Administered Medications  ?Medication Dose Route Frequency Provider Last Rate Last Admin  ? 0.9 %  sodium chloride infusion  500 mL Intravenous Continuous Ladene Artist, MD      ? ? ?SURGICAL HISTORY:  ?Past Surgical History:  ?Procedure Laterality Date  ? COLONOSCOPY  11/19/2016  ? per Dr. Fuller Plan, adenomatous polyps, repeat in 5 yrs   ? ? ?REVIEW OF SYSTEMS:   ?Review of Systems  ?Constitutional: Negative for appetite change, chills, fatigue, fever and unexpected weight change.  ?HENT: Negative for mouth sores, nosebleeds, sore throat and trouble swallowing.   ?Eyes: Negative for eye problems and icterus.  ?Respiratory: Negative for cough, hemoptysis, shortness of breath and wheezing.   ?Cardiovascular: Negative for chest pain and leg swelling.  ?Gastrointestinal: Negative for abdominal pain, constipation, diarrhea, nausea and vomiting.  ?Genitourinary: Negative for bladder incontinence, difficulty urinating, dysuria, frequency and hematuria.   ?Musculoskeletal: Positive for hip pain. Negative for back pain, gait problem, neck pain and neck stiffness.  ?Skin: Negative for itching and rash.  ?Neurological: Negative for dizziness, extremity  weakness, gait problem, headaches, light-headedness and seizures.  ?Hematological: Negative for adenopathy. Does not bruise/bleed easily.  ?Psychiatric/Behavioral: Negative for confusion, depression and sleep disturbance. The patient is not nervous/anxious.   ? ? ?PHYSICAL EXAMINATION:  ?Blood pressure (!) 149/99, pulse 80, temperature (!) 97 ?F (36.1 ?C), temperature source  Tympanic, resp. rate 17, weight 225 lb 1.6 oz (102.1 kg), SpO2 100 %. ? ?ECOG PERFORMANCE STATUS: 1 ? ?Physical Exam  ?Constitutional: Oriented to person, place, and time and well-developed, well-nourished, and in no distress.  ?HENT:  ?Head: Normocephalic and atraumatic.  ?Mouth/Throat: Oropharynx is clear and moist. No oropharyngeal exudate.  ?Eyes: Conjunctivae are normal. Right eye exhibits no discharge. Left eye exhibits no discharge. No scleral icterus.  ?Neck: Normal range of motion. Neck supple.  ?Cardiovascular: Normal rate, regular rhythm, normal heart sounds and intact distal pulses.   ?Pulmonary/Chest: Effort normal and breath sounds normal. No respiratory distress. No wheezes. No rales.  ?Abdominal: Soft. Bowel sounds are normal. Exhibits no distension and no mass. There is no tenderness.  ?Musculoskeletal: Normal range of motion. Exhibits no edema.  ?Lymphadenopathy:  ?  No cervical adenopathy.  ?Neurological: Alert and oriented to person, place, and time. Exhibits normal muscle tone. Gait normal. Coordination normal.  ?Skin: Skin is warm and dry. No rash noted. Not diaphoretic. No erythema. No pallor.  ?Psychiatric: Mood, memory and judgment normal.  ?Vitals reviewed. ? ?LABORATORY DATA: ?Lab Results  ?Component Value Date  ? WBC 3.0 (L) 06/24/2021  ? HGB 14.7 06/24/2021  ? HCT 45.2 06/24/2021  ? MCV 81.6 06/24/2021  ? PLT 209 06/24/2021  ? ? ?  Chemistry   ?   ?Component Value Date/Time  ? NA 142 07/04/2020 1322  ? K 4.1 07/04/2020 1322  ? CL 108 07/04/2020 1322  ? CO2 26 07/04/2020 1322  ? BUN 11 07/04/2020 1322  ? CREATININE 1.30 (H) 07/04/2020 1322  ?    ?Component Value Date/Time  ? CALCIUM 8.6 (L) 07/04/2020 1322  ? ALKPHOS 77 07/04/2020 1322  ? AST 20 07/04/2020 1322  ? ALT 16 07/04/2020 1322  ? BILITOT 0.3 07/04/2020 1322  ?  ? ? ? ?RADIOGRAPHIC STUDIES: ? ?No results found. ? ? ?ASSESSMENT/PLAN:  ?This is a very pleasant 66 year old African-American male referred to the clinic for evaluation  of anemia and leukocytopenia.  The patient's anemia was likely secondary to frequent blood donation.  The patient's leukocytopenia is likely reactive as his other studies were within normal limits and his leukocytopenia is mild and he is asymptomatic without frequent infections. ? ?The patient had repeat lab studies including CBC, ferritin, and iron studies performed.  The patient's labs today show normal Hgb of 14.7. His WBC continues to be slightly low at 3.0 with an [place] of 1.5. His MCV is 81.6. His iron studies are pending.  ?  ?I recommends that the patient continue taking his oral iron supplement.  We will continue to follow him with repeat iron studies every 6 months.  We previously encouraged the patient to limit his blood donation to 2-3 times per year at the most. ? ?Regarding the patient's mild leukopenia, we will continue to monitor this.  The patient is asymptomatic and does not warrant any invasive work-up at this time but we can always consider bone marrow biopsy and aspirate if the patient has significant/worsening neutropenia. ? ?The patient was advised to call immediately if he has any concerning symptoms in the interval. ?The patient voices understanding of current disease status and  treatment options and is in agreement with the current care plan. ?All questions were answered. The patient knows to call the clinic with any problems, questions or concerns. We can certainly see the patient much sooner if necessary ? ? ? ? ? ? ?Orders Placed This Encounter  ?Procedures  ? CBC with Differential ([facility] Only)  ?  Standing Status:   Future  ?  Standing Expiration Date:   06/25/2022  ? Ferritin  ?  Standing Status:   Future  ?  Standing Expiration Date:   06/25/2022  ? Iron and Iron Binding Capacity ([facility] only)  ?  Standing Status:   Future  ?  Standing Expiration Date:   06/25/2022  ?  ? ?The total time spent in the appointment was 20-29 minutes.  ? ?Ivalee Strauser L Sianna Garofano, PA-C ?06/24/21 ? ?

## 2021-06-21 ENCOUNTER — Other Ambulatory Visit: Payer: Self-pay

## 2021-06-21 DIAGNOSIS — D509 Iron deficiency anemia, unspecified: Secondary | ICD-10-CM

## 2021-06-24 ENCOUNTER — Inpatient Hospital Stay: Payer: Medicare PPO | Attending: Physician Assistant | Admitting: Physician Assistant

## 2021-06-24 ENCOUNTER — Inpatient Hospital Stay: Payer: Medicare PPO

## 2021-06-24 ENCOUNTER — Other Ambulatory Visit: Payer: Self-pay

## 2021-06-24 VITALS — BP 149/99 | HR 80 | Temp 97.0°F | Resp 17 | Wt 225.1 lb

## 2021-06-24 DIAGNOSIS — D709 Neutropenia, unspecified: Secondary | ICD-10-CM

## 2021-06-24 DIAGNOSIS — D72819 Decreased white blood cell count, unspecified: Secondary | ICD-10-CM | POA: Diagnosis not present

## 2021-06-24 DIAGNOSIS — Z79899 Other long term (current) drug therapy: Secondary | ICD-10-CM | POA: Diagnosis not present

## 2021-06-24 DIAGNOSIS — D509 Iron deficiency anemia, unspecified: Secondary | ICD-10-CM

## 2021-06-24 LAB — CBC WITH DIFFERENTIAL (CANCER CENTER ONLY)
Abs Immature Granulocytes: 0.01 10*3/uL (ref 0.00–0.07)
Basophils Absolute: 0 10*3/uL (ref 0.0–0.1)
Basophils Relative: 1 %
Eosinophils Absolute: 0.1 10*3/uL (ref 0.0–0.5)
Eosinophils Relative: 4 %
HCT: 45.2 % (ref 39.0–52.0)
Hemoglobin: 14.7 g/dL (ref 13.0–17.0)
Immature Granulocytes: 0 %
Lymphocytes Relative: 26 %
Lymphs Abs: 0.8 10*3/uL (ref 0.7–4.0)
MCH: 26.5 pg (ref 26.0–34.0)
MCHC: 32.5 g/dL (ref 30.0–36.0)
MCV: 81.6 fL (ref 80.0–100.0)
Monocytes Absolute: 0.5 10*3/uL (ref 0.1–1.0)
Monocytes Relative: 17 %
Neutro Abs: 1.5 10*3/uL — ABNORMAL LOW (ref 1.7–7.7)
Neutrophils Relative %: 52 %
Platelet Count: 209 10*3/uL (ref 150–400)
RBC: 5.54 MIL/uL (ref 4.22–5.81)
RDW: 15.1 % (ref 11.5–15.5)
WBC Count: 3 10*3/uL — ABNORMAL LOW (ref 4.0–10.5)
nRBC: 0 % (ref 0.0–0.2)

## 2021-06-24 LAB — FERRITIN: Ferritin: 152 ng/mL (ref 24–336)

## 2021-06-24 LAB — IRON AND IRON BINDING CAPACITY (CC-WL,HP ONLY)
Iron: 70 ug/dL (ref 45–182)
Saturation Ratios: 22 % (ref 17.9–39.5)
TIBC: 325 ug/dL (ref 250–450)
UIBC: 255 ug/dL (ref 117–376)

## 2021-07-01 ENCOUNTER — Encounter: Payer: Self-pay | Admitting: Family Medicine

## 2021-07-01 DIAGNOSIS — G8929 Other chronic pain: Secondary | ICD-10-CM

## 2021-07-02 NOTE — Telephone Encounter (Signed)
At this point I would recommend her see an Orthopedist. I can do a referral if he wishes  ?

## 2021-07-03 NOTE — Telephone Encounter (Signed)
I did the referral to Dr. Alphonzo Severance  ?

## 2021-07-08 ENCOUNTER — Ambulatory Visit (INDEPENDENT_AMBULATORY_CARE_PROVIDER_SITE_OTHER): Payer: Medicare PPO

## 2021-07-08 ENCOUNTER — Ambulatory Visit: Payer: Medicare PPO | Admitting: Orthopedic Surgery

## 2021-07-08 DIAGNOSIS — M79605 Pain in left leg: Secondary | ICD-10-CM

## 2021-07-08 MED ORDER — PREDNISONE 5 MG (21) PO TBPK: ORAL_TABLET | ORAL | 0 refills | Status: DC

## 2021-07-09 DIAGNOSIS — H40012 Open angle with borderline findings, low risk, left eye: Secondary | ICD-10-CM | POA: Diagnosis not present

## 2021-07-14 ENCOUNTER — Encounter: Payer: Self-pay | Admitting: Orthopedic Surgery

## 2021-07-14 NOTE — Progress Notes (Signed)
? ?Office Visit Note ?  ?Patient: Bryan Smith           ?Date of Birth: December 26, 1955           ?MRN: [id number] ?Visit Date: 07/08/2021 ?Requested by: Laurey Morale, MD ?[facility] ?[place],  [place] [address] ?PCP: Laurey Morale, MD ? ?Subjective: ?Chief Complaint  ?Patient presents with  ? Left Leg - Pain  ? ? ?HPI: Bryan Smith is a 66 year old patient with left hip pain of 6 weeks duration.  Has some occasional low back pain when he lays on the left side.  Pain does radiate down the leg to the thigh but not below the knee.  Also reports occasional groin pain.  The pain does not wake him from sleep at night.  Denies any numbness and tingling.  He tried anti-inflammatories without relief.  He was a Geophysical data processor.  He states that he has been limping at times with sharp pain.  He does describe having some pain in the groin.  Pain started relatively quickly without any history of injury.  He has stopped really working out since his hip is started hurting him.  Has not played tennis has not worked on the treadmill and he denies any mechanical symptoms in the hip.  Used to go to the gym about 3-4 times a week.  The radiation pattern was to the anterior thigh. ?             ?ROS: All systems reviewed are negative as they relate to the chief complaint within the history of present illness.  Patient denies  fevers or chills. ? ? ?Assessment & Plan: ?Visit Diagnoses:  ?1. Pain in left leg   ? ? ?Plan: Impression is left hip pain in a 66 year old patient with pretty active lifestyle.  Symptoms ongoing for 6 weeks.  Does have some pain with coughing and sneezing.  Lives alone.  Retired former Scientist, physiological.  Has not really put a lot of miles on the hip outside of his recreational activities.  I think he could have a hernia versus occult arthritis versus labral tear.  Plan MRI left hip to evaluate for those entities within the differential diagnosis as well as Medrol Dosepak 6-day course.  Follow-up after that  study.  It looks more to be hip related as opposed to back related at this time. ? ?Follow-Up Instructions: Return for after MRI.  ? ?Orders:  ?Orders Placed This Encounter  ?Procedures  ? XR Lumbar Spine 2-3 Views  ? XR HIP UNILAT W OR W/O PELVIS 2-3 VIEWS LEFT  ? MR Hip Left w/o contrast  ? ?Meds ordered this encounter  ?Medications  ? predniSONE (STERAPRED UNI-PAK 21 TAB) 5 MG (21) TBPK tablet  ?  Sig: Take dose pack as directed  ?  Dispense:  21 tablet  ?  Refill:  0  ? ? ? ? Procedures: ?No procedures performed ? ? ?Clinical Data: ?No additional findings. ? ?Objective: ?Vital Signs: There were no vitals taken for this visit. ? ?Physical Exam:  ? ?Constitutional: Patient appears well-developed ?HEENT:  ?Head: Normocephalic ?Eyes:EOM are normal ?Neck: Normal range of motion ?Cardiovascular: Normal rate ?Pulmonary/chest: Effort normal ?Neurologic: Patient is alert ?Skin: Skin is warm ?Psychiatric: Patient has normal mood and affect ? ? ?Ortho Exam: Ortho exam demonstrates full active and passive range of motion of both hips today.  Not too much groin pain with internal/external rotation of either hip.  No limitation of internal rotation on  the left or right.  Has 5 out of 5 ankle dorsiflexion plantarflexion quad and hamstring strength with excellent hip flexion abduction adduction strength.  Pedal pulses palpable.  Reflexes symmetric.  No nerve root tension signs.  No discrete trochanteric tenderness.  No abductor weakness. ? ?Specialty Comments:  ?No specialty comments available. ? ?Imaging: ?No results found. ? ? ?PMFS History: ?Patient Active Problem List  ? Diagnosis Date Noted  ? Hip pain, chronic, left 06/19/2021  ? 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 Diabetes mellitus type 2 in obese ([place]) 01/17/2016  ? Chronic neck pain 01/16/2015  ? Palpitations 01/16/2015  ? Anxiety state 01/16/2015  ? Leukocytopenia 01/24/2009  ? SKIN TAG 11/29/2007  ? ANEMIA-IRON DEFICIENCY 04/26/2007  ? HTN (hypertension) 04/26/2007   ? GERD 01/04/2007  ? ?Past Medical History:  ?Diagnosis Date  ? Allergy   ? ANEMIA-IRON DEFICIENCY 04/26/2007  ? Diabetes mellitus without complication ([place])   ? GERD 01/04/2007  ? Hypertension   ? LEUKOCYTOPENIA UNSPECIFIED 01/24/2009  ?  ?Family History  ?Problem Relation Age of Onset  ? Arthritis Mother   ? Colon cancer Mother   ?  ?Past Surgical History:  ?Procedure Laterality Date  ? COLONOSCOPY  11/19/2016  ? per Dr. Fuller Plan, adenomatous polyps, repeat in 5 yrs   ? ?Social History  ? ?Occupational History  ? Occupation: retired  ?Tobacco Use  ? Smoking status: Never  ? Smokeless tobacco: Never  ?Vaping Use  ? Vaping Use: Never used  ?Substance and Sexual Activity  ? Alcohol use: No  ?  Alcohol/week: 0.0 standard drinks  ? Drug use: No  ? Sexual activity: Not on file  ? ? ? ? ? ?

## 2021-07-17 ENCOUNTER — Ambulatory Visit
Admission: RE | Admit: 2021-07-17 | Discharge: 2021-07-17 | Disposition: A | Discharge status: Home / Self Care | Payer: Medicare PPO | Source: Ambulatory Visit | Attending: Orthopedic Surgery | Admitting: Orthopedic Surgery

## 2021-07-17 DIAGNOSIS — M79605 Pain in left leg: Secondary | ICD-10-CM

## 2021-07-17 DIAGNOSIS — M25552 Pain in left hip: Secondary | ICD-10-CM | POA: Diagnosis not present

## 2021-07-18 DIAGNOSIS — R972 Elevated prostate specific antigen [PSA]: Secondary | ICD-10-CM | POA: Diagnosis not present

## 2021-07-23 ENCOUNTER — Telehealth: Payer: Self-pay | Admitting: Orthopedic Surgery

## 2021-07-23 NOTE — Telephone Encounter (Signed)
Called patient left message to return call to schedule an MRI review appointment with Dr. Marlou Sa        ?

## 2021-07-25 DIAGNOSIS — N400 Enlarged prostate without lower urinary tract symptoms: Secondary | ICD-10-CM | POA: Diagnosis not present

## 2021-07-25 DIAGNOSIS — R972 Elevated prostate specific antigen [PSA]: Secondary | ICD-10-CM | POA: Diagnosis not present

## 2021-07-28 ENCOUNTER — Other Ambulatory Visit: Payer: Self-pay | Admitting: Family Medicine

## 2021-07-28 DIAGNOSIS — I10 Essential (primary) hypertension: Secondary | ICD-10-CM

## 2021-07-29 ENCOUNTER — Encounter: Payer: Medicare PPO | Admitting: Family Medicine

## 2021-08-06 ENCOUNTER — Encounter: Payer: Self-pay | Admitting: Surgical

## 2021-08-06 ENCOUNTER — Ambulatory Visit: Payer: Medicare PPO | Admitting: Surgical

## 2021-08-06 DIAGNOSIS — M79605 Pain in left leg: Secondary | ICD-10-CM | POA: Diagnosis not present

## 2021-08-06 NOTE — Progress Notes (Signed)
Office Visit Note   Patient: Bryan Smith           Date of Birth: 07/18/1955           MRN: [id number] Visit Date: 08/06/2021 Requested by: Laurey Morale, MD [place],  [place] [address] PCP: Laurey Morale, MD  Subjective: Chief Complaint  Patient presents with   Left Hip - Follow-up    HPI: Bryan Smith is a 66 y.o. male who presents to the office for MRI review.  Reports that the steroid pack he had from his last appointment already resolved his symptoms.  He feels "almost normal".  He has been able to return to the gym over the last 1.5 to 2 weeks.  He denies any pain or discomfort at any time but he does note that after he walks for about a mile he gets a little bit of fatigued sensation in the gluteal and thigh regions.  He has not gone back to playing tennis yet.  MRI results revealed: MR Hip Left w/o contrast  Result Date: 07/20/2021 CLINICAL DATA:  Left hip pain EXAM: MR OF THE LEFT HIP WITHOUT CONTRAST TECHNIQUE: Multiplanar, multisequence MR imaging was performed. No intravenous contrast was administered. COMPARISON:  Left hip x-ray 07/08/2021 FINDINGS: Bones: No acute fracture or dislocation identified. No suspicious bone marrow signal abnormalities. Articular cartilage and labrum Articular cartilage:  Appears preserved. Labrum:  Not well evaluated without contrast. Joint or bursal effusion Joint effusion:  None. Bursae: Unremarkable. Muscles and tendons Muscles and tendons:  Within normal limits. Other findings Miscellaneous:   None. IMPRESSION: No significant abnormality identified in the left hip. Electronically Signed   By: Ofilia Neas M.D.   On: 07/20/2021 17:20                 ROS: All systems reviewed are negative as they relate to the chief complaint within the history of present illness.  Patient denies fevers or chills.  Assessment & Plan: Visit Diagnoses:  1. Pain in left leg     Plan: Bryan Smith is a 66 y.o. male who  presents to the office for evaluation of hip MRI.  MRI showed no significant abnormality of the left hip.  He has had resolution of symptoms with steroid Dosepak.  He did have a little bit of low back pain at his last appointment with some of his pain being worse with coughing or sneezing.  With absence of pathology in the left hip MRI, could be referred pain from the low back.  However, the vast majority of his symptoms have resolved with the steroid Dosepak which is fortunate.  Recommended he try and ease back into doing some hitting and doubles tennis.  If he has any recurrence of symptoms, he will let us know.  Could consider physical therapy or further evaluation of lumbar spine pathology with MRI lumbar spine if his symptoms recur.  Follow-up for final check in 2 months with Dr. Marlou Sa.  If he is feeling 100% at that point, recommended he cancel his appointment but otherwise he is free to follow-up.  Follow-Up Instructions: No follow-ups on file.   Orders:  No orders of the defined types were placed in this encounter.  No orders of the defined types were placed in this encounter.     Procedures: No procedures performed   Clinical Data: No additional findings.  Objective: Vital Signs: There were no vitals taken for this visit.  Physical  Exam:  Constitutional: Patient appears well-developed HEENT:  Head: Normocephalic Eyes:EOM are normal Neck: Normal range of motion Cardiovascular: Normal rate Pulmonary/chest: Effort normal Neurologic: Patient is alert Skin: Skin is warm Psychiatric: Patient has normal mood and affect  Ortho Exam:  No change since prior exam  Specialty Comments:  No specialty comments available.  Imaging: No results found.   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   Diabetes mellitus type 2 in obese ([place]) 01/17/2016   Chronic neck pain 01/16/2015    Palpitations 01/16/2015   Anxiety state 01/16/2015   Leukocytopenia 01/24/2009   SKIN TAG 11/29/2007   ANEMIA-IRON DEFICIENCY 04/26/2007   HTN (hypertension) 04/26/2007   GERD 01/04/2007   Past Medical History:  Diagnosis Date   Allergy    ANEMIA-IRON DEFICIENCY 04/26/2007   Diabetes mellitus without complication ([place])    GERD 01/04/2007   Hypertension    LEUKOCYTOPENIA UNSPECIFIED 01/24/2009    Family History  Problem Relation Age of Onset   Arthritis Mother    Colon cancer Mother     Past Surgical History:  Procedure Laterality Date   COLONOSCOPY  11/19/2016   per Dr. Fuller Plan, adenomatous polyps, repeat in 5 yrs    Social History   Occupational History   Occupation: retired  Tobacco Use   Smoking status: Never   Smokeless tobacco: Never  Vaping Use   Vaping Use: Never used  Substance and Sexual Activity   Alcohol use: No    Alcohol/week: 0.0 standard drinks   Drug use: No   Sexual activity: Not on file

## 2021-08-27 DIAGNOSIS — H25013 Cortical age-related cataract, bilateral: Secondary | ICD-10-CM | POA: Diagnosis not present

## 2021-08-27 DIAGNOSIS — H5213 Myopia, bilateral: Secondary | ICD-10-CM | POA: Diagnosis not present

## 2021-08-27 DIAGNOSIS — H2513 Age-related nuclear cataract, bilateral: Secondary | ICD-10-CM | POA: Diagnosis not present

## 2021-08-27 DIAGNOSIS — H524 Presbyopia: Secondary | ICD-10-CM | POA: Diagnosis not present

## 2021-08-28 ENCOUNTER — Ambulatory Visit (INDEPENDENT_AMBULATORY_CARE_PROVIDER_SITE_OTHER): Payer: Medicare PPO | Admitting: Family Medicine

## 2021-08-28 ENCOUNTER — Encounter: Payer: Self-pay | Admitting: Family Medicine

## 2021-08-28 VITALS — BP 124/80 | HR 66 | Temp 98.7°F | Ht 70.0 in | Wt 224.0 lb

## 2021-08-28 DIAGNOSIS — K219 Gastro-esophageal reflux disease without esophagitis: Secondary | ICD-10-CM

## 2021-08-28 DIAGNOSIS — R972 Elevated prostate specific antigen [PSA]: Secondary | ICD-10-CM

## 2021-08-28 DIAGNOSIS — M542 Cervicalgia: Secondary | ICD-10-CM | POA: Diagnosis not present

## 2021-08-28 DIAGNOSIS — D509 Iron deficiency anemia, unspecified: Secondary | ICD-10-CM

## 2021-08-28 DIAGNOSIS — N400 Enlarged prostate without lower urinary tract symptoms: Secondary | ICD-10-CM | POA: Diagnosis not present

## 2021-08-28 DIAGNOSIS — D709 Neutropenia, unspecified: Secondary | ICD-10-CM

## 2021-08-28 DIAGNOSIS — E669 Obesity, unspecified: Secondary | ICD-10-CM | POA: Diagnosis not present

## 2021-08-28 DIAGNOSIS — I10 Essential (primary) hypertension: Secondary | ICD-10-CM | POA: Diagnosis not present

## 2021-08-28 DIAGNOSIS — F411 Generalized anxiety disorder: Secondary | ICD-10-CM

## 2021-08-28 DIAGNOSIS — E1169 Type 2 diabetes mellitus with other specified complication: Secondary | ICD-10-CM

## 2021-08-28 DIAGNOSIS — M25552 Pain in left hip: Secondary | ICD-10-CM | POA: Diagnosis not present

## 2021-08-28 DIAGNOSIS — G8929 Other chronic pain: Secondary | ICD-10-CM

## 2021-08-28 LAB — CBC WITH DIFFERENTIAL/PLATELET
Basophils Absolute: 0 10*3/uL (ref 0.0–0.1)
Basophils Relative: 1 % (ref 0.0–3.0)
Eosinophils Absolute: 0.1 10*3/uL (ref 0.0–0.7)
Eosinophils Relative: 3.7 % (ref 0.0–5.0)
HCT: 43.5 % (ref 39.0–52.0)
Hemoglobin: 14.1 g/dL (ref 13.0–17.0)
Lymphocytes Relative: 27.5 % (ref 12.0–46.0)
Lymphs Abs: 0.9 10*3/uL (ref 0.7–4.0)
MCHC: 32.4 g/dL (ref 30.0–36.0)
MCV: 82.8 fl (ref 78.0–100.0)
Monocytes Absolute: 0.6 10*3/uL (ref 0.1–1.0)
Monocytes Relative: 17.8 % — ABNORMAL HIGH (ref 3.0–12.0)
Neutro Abs: 1.6 10*3/uL (ref 1.4–7.7)
Neutrophils Relative %: 50 % (ref 43.0–77.0)
Platelets: 231 10*3/uL (ref 150.0–400.0)
RBC: 5.26 Mil/uL (ref 4.22–5.81)
RDW: 15.3 % (ref 11.5–15.5)
WBC: 3.2 10*3/uL — ABNORMAL LOW (ref 4.0–10.5)

## 2021-08-28 LAB — LIPID PANEL
Cholesterol: 183 mg/dL (ref 0–200)
HDL: 39.1 mg/dL (ref >39.00)
LDL Cholesterol: 113 mg/dL — ABNORMAL HIGH (ref 0–99)
NonHDL: 144.35
Total CHOL/HDL Ratio: 5
Triglycerides: 159 mg/dL — ABNORMAL HIGH (ref 0.0–149.0)
VLDL: 31.8 mg/dL (ref 0.0–40.0)

## 2021-08-28 LAB — BASIC METABOLIC PANEL
BUN: 13 mg/dL (ref 6–23)
CO2: 26 mEq/L (ref 19–32)
Calcium: 8.9 mg/dL (ref 8.4–10.5)
Chloride: 105 mEq/L (ref 96–112)
Creatinine, Ser: 1.32 mg/dL (ref 0.40–1.50)
GFR: 55.46 mL/min — ABNORMAL LOW (ref >60.00)
Glucose, Bld: 122 mg/dL — ABNORMAL HIGH (ref 70–99)
Potassium: 4.2 mEq/L (ref 3.5–5.1)
Sodium: 138 mEq/L (ref 135–145)

## 2021-08-28 LAB — IRON: Iron: 86 ug/dL (ref 42–165)

## 2021-08-28 LAB — HEPATIC FUNCTION PANEL
ALT: 15 U/L (ref 0–53)
AST: 15 U/L (ref 0–37)
Albumin: 3.7 g/dL (ref 3.5–5.2)
Alkaline Phosphatase: 70 U/L (ref 39–117)
Bilirubin, Direct: 0.1 mg/dL (ref 0.0–0.3)
Total Bilirubin: 0.4 mg/dL (ref 0.2–1.2)
Total Protein: 5.9 g/dL — ABNORMAL LOW (ref 6.0–8.3)

## 2021-08-28 LAB — IBC + FERRITIN
Ferritin: 77.8 ng/mL (ref 22.0–322.0)
Iron: 86 ug/dL (ref 42–165)
Saturation Ratios: 25.7 % (ref 20.0–50.0)
TIBC: 334.6 ug/dL (ref 250.0–450.0)
Transferrin: 239 mg/dL (ref 212.0–360.0)

## 2021-08-28 LAB — TSH: TSH: 2.99 u[IU]/mL (ref 0.35–5.50)

## 2021-08-28 LAB — HEMOGLOBIN A1C: Hgb A1c MFr Bld: 6.6 % — ABNORMAL HIGH (ref 4.6–6.5)

## 2021-08-28 NOTE — Progress Notes (Signed)
Subjective:    Patient ID: Bryan Smith, male    DOB: 1955-03-23, 66 y.o.   MRN: [id number]  HPI Here to follow up on issues. He feels well and has no concerns. He had  an eye exam yesterday and this came out fine. He saw Dr. Lovena Neighbours in May for a prostate check. The PSA was stable at 1.63. His GERD and anxiety are stable. His BP is stable. He still takes an OTC iron pill every day.    Review of Systems  Constitutional: Negative.   HENT: Negative.    Eyes: Negative.   Respiratory: Negative.    Cardiovascular: Negative.   Gastrointestinal: Negative.   Genitourinary: Negative.   Musculoskeletal: Negative.   Skin: Negative.   Neurological: Negative.   Psychiatric/Behavioral: Negative.         Objective:   Physical Exam Constitutional:      General: He is not in acute distress.    Appearance: Normal appearance. He is well-developed. He is not diaphoretic.  HENT:     Head: Normocephalic and atraumatic.     Right Ear: External ear normal.     Left Ear: External ear normal.     Nose: Nose normal.     Mouth/Throat:     Pharynx: No oropharyngeal exudate.  Eyes:     General: No scleral icterus.       Right eye: No discharge.        Left eye: No discharge.     Conjunctiva/sclera: Conjunctivae normal.     Pupils: Pupils are equal, round, and reactive to light.  Neck:     Thyroid: No thyromegaly.     Vascular: No JVD.     Trachea: No tracheal deviation.  Cardiovascular:     Rate and Rhythm: Normal rate and regular rhythm.     Heart sounds: Normal heart sounds. No murmur heard.    No friction rub. No gallop.  Pulmonary:     Effort: Pulmonary effort is normal. No respiratory distress.     Breath sounds: Normal breath sounds. No wheezing or rales.  Chest:     Chest wall: No tenderness.  Abdominal:     General: Bowel sounds are normal. There is no distension.     Palpations: Abdomen is soft. There is no mass.     Tenderness: There is no abdominal tenderness. There is no  guarding or rebound.  Genitourinary:    Penis: No tenderness.   Musculoskeletal:        General: No tenderness. Normal range of motion.     Cervical back: Neck supple.  Lymphadenopathy:     Cervical: No cervical adenopathy.  Skin:    General: Skin is warm and dry.     Coloration: Skin is not pale.     Findings: No erythema or rash.  Neurological:     Mental Status: He is alert and oriented to person, place, and time.     Cranial Nerves: No cranial nerve deficit.     Motor: No abnormal muscle tone.     Coordination: Coordination normal.     Deep Tendon Reflexes: Reflexes are normal and symmetric. Reflexes normal.  Psychiatric:        Behavior: Behavior normal.        Thought Content: Thought content normal.        Judgment: Judgment normal.           Assessment & Plan:  His HTN is well controlled. The anxiety and GERD are  stable. His BPH is stable. We will get fasting labs today to check an A1c, iron and ferritin, etc. We spent a total of (33   ) minutes reviewing records and discussing these issues.  Alysia Penna, MD

## 2021-09-05 ENCOUNTER — Encounter: Payer: Self-pay | Admitting: Family Medicine

## 2021-09-07 ENCOUNTER — Ambulatory Visit
Admission: RE | Admit: 2021-09-07 | Discharge: 2021-09-07 | Disposition: A | Discharge status: Home / Self Care | Payer: Medicare PPO | Source: Ambulatory Visit | Attending: Urgent Care | Admitting: Urgent Care

## 2021-09-07 VITALS — BP 153/94 | HR 98 | Temp 98.4°F | Resp 20

## 2021-09-07 DIAGNOSIS — J069 Acute upper respiratory infection, unspecified: Secondary | ICD-10-CM

## 2021-09-07 MED ORDER — IPRATROPIUM BROMIDE 0.06 % NA SOLN: 2.0000 | Freq: Four times a day (QID) | NASAL | 12 refills | Status: DC

## 2021-09-07 MED ORDER — LEVOCETIRIZINE DIHYDROCHLORIDE 5 MG PO TABS: 2.5000 mg | ORAL_TABLET | Freq: Every evening | ORAL | 0 refills | Status: DC

## 2021-09-07 NOTE — ED Triage Notes (Signed)
Pt here with a cough and sore throat x 5 days.

## 2021-09-07 NOTE — ED Provider Notes (Signed)
[facility]    CSN: [id number] Arrival date & time: 09/07/21  1252      History   Chief Complaint Chief Complaint  Patient presents with   Cough   Sore Throat    HPI Bryan Smith is a 66 y.o. Smith.   Bryan 66 year old Smith presents today with concerns of a head cold.  He states his symptoms started on Monday and feels similar to previous had goats.  He reports a dry cough, a scratchy throat, and sinus congestion.  He has been taking over-the-counter Advil Cold and Sinus with resolution to his symptoms.  He feels that the drainage is causing a slight hoarseness to his voice.  He was recently at a wedding, but denies any known sick contacts.  He denies fever, headache, ear pain.  He denies shortness of breath, productive cough, or wheezing.  He does not smoke.   Cough Sore Throat    Past Medical History:  Diagnosis Date   Allergy    ANEMIA-IRON DEFICIENCY 04/26/2007   BPH with elevated PSA    sees Dr. 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 Fuller Plan, adenomatous polyps, repeat in 5 yrs        Home Medications    Prior to Admission medications   Medication Sig Start Date End Date Taking? Authorizing Provider  ipratropium (ATROVENT) 0.06 % nasal spray Place 2 sprays into both nostrils 4 (four) times daily. 09/07/21  Yes Rodolphe Edmonston L, PA  levocetirizine (XYZAL) 5 MG tablet  Take 0.5 tablets (2.5 mg total) by mouth every evening. 09/07/21  Yes Jodilyn Giese L, PA  esomeprazole (NEXIUM) 40 MG capsule Take 1 capsule (40 mg total) by mouth daily. 04/02/20   Laurey Morale, MD  Ferrous Sulfate (IRON) 325 (65 Fe) MG TABS Take by mouth.    [provider]  losartan (COZAAR) 50 MG tablet TAKE 1 TABLET BY MOUTH EVERY DAY 07/29/21   Laurey Morale, MD  Multiple Vitamins-Minerals (CENTRUM SILVER PO) Take by mouth as needed.    [provider]  promethazine (PHENERGAN) 25 MG tablet Take 1 tablet (25 mg total) by mouth every 4 (four) hours as needed for nausea or vomiting. 10/15/20   Laurey Morale, MD    Family History Family History  Problem Relation Age of Onset   Arthritis Mother    Colon cancer Mother     Social History Social History   Tobacco Use   Smoking status: Never   Smokeless tobacco: Never  Vaping Use   Vaping Use: Never used  Substance Use Topics   Alcohol use: No    Alcohol/week: 0.0 standard drinks of alcohol   Drug use: No     Allergies   Lisinopril and Penicillins   Review of Systems Review of Systems  Respiratory:  Positive for cough.   As per HPI  Physical Exam Triage Vital Signs ED Triage Vitals  Enc Vitals Group     BP 09/07/21 1310 (!) 153/94     Pulse Rate 09/07/21 1310 98     Resp 09/07/21 1310 20     Temp 09/07/21 1310 98.4 F (36.9 C)     Temp src --      SpO2 09/07/21 1310 98 %     Weight --      Height --      Head Circumference --      Peak Flow --      Pain Score 09/07/21 1311 2     Pain Loc --      Pain Edu? --      Excl. in [place]? --    No data found.  Updated Vital Signs BP (!) 153/94   Pulse 98   Temp 98.4 F (36.9 C)   Resp 20   SpO2 98%   Visual Acuity Right Eye Distance:   Left Eye Distance:   Bilateral Distance:    Right Eye Near:   Left Eye Near:    Bilateral Near:     Physical Exam Vitals and nursing note reviewed.  Constitutional:      General: He is not in acute  distress.    Appearance: He is well-developed and normal weight. He is not ill-appearing or toxic-appearing.  HENT:     Head: Normocephalic and atraumatic.     Right Ear: Tympanic membrane and ear canal normal. No drainage, swelling or tenderness. No middle ear effusion. Tympanic membrane is not erythematous.     Left Ear: Tympanic membrane and ear canal normal. No drainage, swelling or tenderness.  No middle ear effusion. Tympanic membrane is not erythematous.     Nose: Congestion and rhinorrhea present.     Right Sinus: No maxillary sinus tenderness or frontal sinus tenderness.     Left Sinus: No maxillary sinus tenderness or frontal sinus tenderness.     Mouth/Throat:     Mouth: Mucous membranes are moist. No oral lesions.     Pharynx: Oropharynx is clear. Uvula midline. No pharyngeal swelling, oropharyngeal exudate, posterior oropharyngeal erythema or uvula swelling.  Eyes:     Extraocular Movements:     Right eye: Normal extraocular motion.     Conjunctiva/sclera: Conjunctivae normal.  Neck:     Thyroid: No thyromegaly.  Cardiovascular:     Rate and Rhythm: Normal rate and regular rhythm.  Pulmonary:     Effort: Pulmonary effort is normal. No respiratory distress.     Breath sounds: Normal breath sounds. No stridor. No wheezing, rhonchi or rales.  Chest:     Chest wall: No tenderness.  Musculoskeletal:     Cervical back: Normal range of motion and neck supple.  Lymphadenopathy:     Cervical: No cervical adenopathy.  Skin:    General: Skin is warm and dry.     Capillary Refill: Capillary refill takes less than 2 seconds.     Coloration: Skin is not jaundiced.     Findings: No erythema or rash.  Neurological:     General: No focal deficit present.     Mental Status: He is alert and oriented to person, place, and time.      UC Treatments / Results  Labs (all labs ordered are listed, but only abnormal results are displayed) Labs Reviewed - No data to  display  EKG   Radiology No results found.  Procedures Procedures (including critical care time)  Medications Ordered in UC Medications - No data to display  Initial Impression / Assessment and Plan / UC Course  I have reviewed the triage vital signs and the nursing notes.  Pertinent labs & imaging results that were available during my care of the patient were reviewed by me and considered in my medical decision making (see chart for details).     Viral URI with cough - supportive care. Pt was asking about the need for a covid test, however discussed with pt that today was day six, therefore even if positive he does not qualify for treatment. Supportive care appropriate at current time, sx improved with OTC meds. Will add xyzal and atrovent for congestion. RTC precautions reviewed  Final Clinical Impressions(s) / UC Diagnoses   Final diagnoses:  Viral upper respiratory tract infection with cough     Discharge Instructions      Your symptoms sound most consistent with a viral illness, likely rhinovirus. Supportive care is the mainstay of treatment - rest, hydration, frequent hand washing, tylenol or ibuprofen as needed for fever or aches.  Start the nasal spray called in today, 2 sprays 4 times daily as needed You may also start the antihistamine called in daily as needed until symptoms resolve. Please continue the advil cold and sinus as needed If symptoms persist >10 days or you develop a fever, please return for recheck    ED Prescriptions     Medication Sig Dispense Auth. Provider   ipratropium (ATROVENT) 0.06 % nasal spray Place 2 sprays into both nostrils 4 (four) times daily. 15 mL Angi Goodell L, PA   levocetirizine (XYZAL) 5 MG tablet Take 0.5 tablets (2.5 mg total) by mouth every evening. 14 tablet Alese Furniss L, [place]      PDMP not reviewed this encounter.   Chaney Malling, [place] 09/07/21 1358

## 2021-09-07 NOTE — Discharge Instructions (Addendum)
Your symptoms sound most consistent with a viral illness, likely rhinovirus. Supportive care is the mainstay of treatment - rest, hydration, frequent hand washing, tylenol or ibuprofen as needed for fever or aches.  Start the nasal spray called in today, 2 sprays 4 times daily as needed You may also start the antihistamine called in daily as needed until symptoms resolve. Please continue the advil cold and sinus as needed If symptoms persist >10 days or you develop a fever, please return for recheck

## 2021-10-01 ENCOUNTER — Other Ambulatory Visit: Payer: Self-pay

## 2021-10-01 ENCOUNTER — Encounter: Payer: Self-pay | Admitting: Family Medicine

## 2021-10-01 MED ORDER — BENZONATATE 200 MG PO CAPS: 200.0000 mg | ORAL_CAPSULE | Freq: Three times a day (TID) | ORAL | 0 refills | Status: DC | PRN

## 2021-10-01 NOTE — Telephone Encounter (Signed)
Call in Benzonatate 200 mg to take every 8 hours as needed for cough, #60 with no rf

## 2021-10-07 ENCOUNTER — Other Ambulatory Visit: Payer: Self-pay | Admitting: Family Medicine

## 2021-10-18 ENCOUNTER — Ambulatory Visit: Payer: Medicare PPO | Admitting: Surgical

## 2021-10-24 ENCOUNTER — Other Ambulatory Visit: Payer: Self-pay | Admitting: Family Medicine

## 2021-10-24 DIAGNOSIS — I10 Essential (primary) hypertension: Secondary | ICD-10-CM

## 2021-11-05 ENCOUNTER — Encounter: Payer: Self-pay | Admitting: Family Medicine

## 2021-11-05 ENCOUNTER — Telehealth (INDEPENDENT_AMBULATORY_CARE_PROVIDER_SITE_OTHER): Payer: Medicare PPO | Admitting: Family Medicine

## 2021-11-05 VITALS — Ht 71.0 in | Wt 217.0 lb

## 2021-11-05 DIAGNOSIS — J392 Other diseases of pharynx: Secondary | ICD-10-CM | POA: Diagnosis not present

## 2021-11-05 DIAGNOSIS — R059 Cough, unspecified: Secondary | ICD-10-CM | POA: Diagnosis not present

## 2021-11-05 MED ORDER — BENZONATATE 100 MG PO CAPS: ORAL_CAPSULE | ORAL | 0 refills | Status: DC

## 2021-11-05 NOTE — Patient Instructions (Signed)
-  I sent the medication(s) we discussed to your pharmacy: Meds ordered this encounter  Medications   benzonatate (TESSALON PERLES) 100 MG capsule    Sig: 1-2 capsules up to twice daily as needed for cough.    Dispense:  30 capsule    Refill:  0   Do a covid test today and tomorrow. If positive and in first 5 days of symptoms and you desire treatment you can contact a House pharmacy or schedule a follow up virtual visit.   I hope you are feeling better soon!  Seek in person care promptly if your symptoms worsen, new concerns arise or you are not improving with treatment.  It was nice to meet you today. I help [facility] out with telemedicine visits on Tuesdays and Thursdays and am happy to help if you need a virtual follow up visit on those days. Otherwise, if you have any concerns or questions following this visit please schedule a follow up visit with your Primary Care office or seek care at a local urgent care clinic to avoid delays in care. If you are having severe or life threatening symptoms please call 911 and/or go to the nearest emergency room.

## 2021-11-05 NOTE — Progress Notes (Signed)
Virtual Visit via Video Note  I connected with Bryan Smith  on 11/05/21 at  1:20 PM EDT by a video enabled telemedicine application and verified that I am speaking with the correct person using two identifiers.  Location patient: Bryan Smith Location provider:work or home office Persons participating in the virtual visit: patient, provider  I discussed the limitations and requested verbal permission for telemedicine visit. The patient expressed understanding and agreed to proceed.   HPI:  Acute telemedicine visit for cough: -Onset: 2 days ago -Symptoms include: irritated throat, intermittnet cough - reports other was feels good -Denies: fevers, malaise, CP, SOB, NVD, known sick contacts -Has tried:nothing -Pertinent past medical history: see below  -Pertinent medication allergies: Allergies  Allergen Reactions   Lisinopril     cough   Penicillins    -COVID-19 vaccine status:  Immunization History  Administered Date(s) Administered   Fluad Quad(high Dose 65+) 12/21/2018   Influenza, High Dose Seasonal PF 11/22/2020   Influenza,inj,Quad PF,6+ Mos 01/17/2016, 12/10/2016, 12/24/2017   Influenza-Unspecified 12/13/2019   PFIZER(Purple Top)SARS-COV-2 Vaccination 04/02/2019, 05/03/2019, 12/30/2019   Pfizer Covid-19 Vaccine Bivalent Booster 2yr & up 12/05/2020   Pneumococcal Conjugate-13 04/06/2018   Pneumococcal Polysaccharide-23 06/27/2020   Tdap 06/27/2020   Zoster Recombinat (Shingrix) 01/10/2019, 06/29/2019   Zoster, Live 08/14/2014     ROS: See pertinent positives and negatives per HPI.  Past Medical History:  Diagnosis Date   Allergy    ANEMIA-IRON DEFICIENCY 04/26/2007   BPH with elevated PSA    sees Dr. CHarrell Gave[place]   Diabetes mellitus without complication ([place]    GERD 01/04/2007   Hypertension    LEUKOCYTOPENIA UNSPECIFIED 01/24/2009    Past Surgical History:  Procedure Laterality Date   COLONOSCOPY  11/19/2016   per Dr. SFuller Plan adenomatous polyps, repeat in 5  yrs      Current Outpatient Medications:    benzonatate (TESSALON PERLES) 100 MG capsule, 1-2 capsules up to twice daily as needed for cough., Disp: 30 capsule, Rfl: 0   esomeprazole (NEXIUM) 40 MG capsule, TAKE ONE CAPSULE BY MOUTH DAILY (Patient taking differently: Take 40 mg by mouth daily. PRN), Disp: 30 capsule, Rfl: 2   Ferrous Sulfate (IRON) 325 (65 Fe) MG TABS, Take by mouth., Disp: , Rfl:    levocetirizine (XYZAL) 5 MG tablet, Take 0.5 tablets (2.5 mg total) by mouth every evening., Disp: 14 tablet, Rfl: 0   losartan (COZAAR) 50 MG tablet, TAKE 1 TABLET BY MOUTH EVERY DAY, Disp: 90 tablet, Rfl: 0   Multiple Vitamins-Minerals (CENTRUM SILVER PO), Take by mouth as needed., Disp: , Rfl:    ipratropium (ATROVENT) 0.06 % nasal spray, Place 2 sprays into both nostrils 4 (four) times daily. (Patient not taking: Reported on 11/05/2021), Disp: 15 mL, Rfl: 12   promethazine (PHENERGAN) 25 MG tablet, Take 1 tablet (25 mg total) by mouth every 4 (four) hours as needed for nausea or vomiting. (Patient not taking: Reported on 11/05/2021), Disp: 60 tablet, Rfl: 2  EXAM:  VITALS per patient if applicable: denies fever  GENERAL: alert, oriented, appears well and in no acute distress  HEENT: atraumatic, conjunttiva clear, no obvious abnormalities on inspection of external nose and ears  NECK: normal movements of the head and neck  LUNGS: on inspection no signs of respiratory distress, breathing rate appears normal, no obvious gross SOB, gasping or wheezing  CV: no obvious cyanosis  MS: moves all visible extremities without noticeable abnormality  PSYCH/NEURO: pleasant and cooperative, no obvious depression or anxiety, speech and thought  processing grossly intact  ASSESSMENT AND PLAN:  Discussed the following assessment and plan:  Cough, unspecified type  Throat irritation  -we discussed possible serious and likely etiologies, options for evaluation and workup, limitations of telemedicine  visit vs in person visit, treatment, treatment risks and precautions. Pt is agreeable to treatment via telemedicine at this moment. Query VURI, covid (seeing lots of cases this week), Allergic rhinitis vs other. He has opted to do home covid testing and knows can contact [place] pharmacy or do fu vv if positive and desires tx. Tessalon rx for cough. Other symptomatic care measures per patient instructions.  Advised to seek prompt virtual visit or in person care if worsening, new symptoms arise, or if is not improving with treatment as expected per our conversation of expected course. Discussed options for follow up care. Did let this patient know that I do telemedicine on Tuesdays and Thursdays for [place] and those are the days I am logged into the system. Advised to schedule follow up visit with PCP, [place] virtual visits or [facility] if any further questions or concerns to avoid delays in care.   I discussed the assessment and treatment plan with the patient. The patient was provided an opportunity to ask questions and all were answered. The patient agreed with the plan and demonstrated an understanding of the instructions.     Bryan Kern, DO

## 2021-12-10 ENCOUNTER — Encounter: Payer: Self-pay | Admitting: Gastroenterology

## 2021-12-23 ENCOUNTER — Inpatient Hospital Stay (HOSPITAL_BASED_OUTPATIENT_CLINIC_OR_DEPARTMENT_OTHER): Payer: Medicare PPO | Admitting: Internal Medicine

## 2021-12-23 ENCOUNTER — Inpatient Hospital Stay: Payer: Medicare PPO | Attending: Internal Medicine

## 2021-12-23 VITALS — BP 149/96 | HR 94 | Temp 97.5°F | Resp 16 | Wt 224.5 lb

## 2021-12-23 DIAGNOSIS — I10 Essential (primary) hypertension: Secondary | ICD-10-CM | POA: Insufficient documentation

## 2021-12-23 DIAGNOSIS — E119 Type 2 diabetes mellitus without complications: Secondary | ICD-10-CM | POA: Diagnosis not present

## 2021-12-23 DIAGNOSIS — D649 Anemia, unspecified: Secondary | ICD-10-CM | POA: Diagnosis not present

## 2021-12-23 DIAGNOSIS — D508 Other iron deficiency anemias: Secondary | ICD-10-CM

## 2021-12-23 DIAGNOSIS — D72819 Decreased white blood cell count, unspecified: Secondary | ICD-10-CM | POA: Diagnosis not present

## 2021-12-23 DIAGNOSIS — Z79899 Other long term (current) drug therapy: Secondary | ICD-10-CM | POA: Insufficient documentation

## 2021-12-23 DIAGNOSIS — D709 Neutropenia, unspecified: Secondary | ICD-10-CM

## 2021-12-23 DIAGNOSIS — D509 Iron deficiency anemia, unspecified: Secondary | ICD-10-CM

## 2021-12-23 DIAGNOSIS — N400 Enlarged prostate without lower urinary tract symptoms: Secondary | ICD-10-CM | POA: Diagnosis not present

## 2021-12-23 LAB — CBC WITH DIFFERENTIAL (CANCER CENTER ONLY)
Abs Immature Granulocytes: 0.01 10*3/uL (ref 0.00–0.07)
Basophils Absolute: 0 10*3/uL (ref 0.0–0.1)
Basophils Relative: 1 %
Eosinophils Absolute: 0.1 10*3/uL (ref 0.0–0.5)
Eosinophils Relative: 3 %
HCT: 45 % (ref 39.0–52.0)
Hemoglobin: 14.5 g/dL (ref 13.0–17.0)
Immature Granulocytes: 0 %
Lymphocytes Relative: 30 %
Lymphs Abs: 1.1 10*3/uL (ref 0.7–4.0)
MCH: 26.4 pg (ref 26.0–34.0)
MCHC: 32.2 g/dL (ref 30.0–36.0)
MCV: 82 fL (ref 80.0–100.0)
Monocytes Absolute: 0.6 10*3/uL (ref 0.1–1.0)
Monocytes Relative: 16 %
Neutro Abs: 1.8 10*3/uL (ref 1.7–7.7)
Neutrophils Relative %: 50 %
Platelet Count: 259 10*3/uL (ref 150–400)
RBC: 5.49 MIL/uL (ref 4.22–5.81)
RDW: 15.3 % (ref 11.5–15.5)
WBC Count: 3.6 10*3/uL — ABNORMAL LOW (ref 4.0–10.5)
nRBC: 0 % (ref 0.0–0.2)

## 2021-12-23 LAB — IRON AND IRON BINDING CAPACITY (CC-WL,HP ONLY)
Iron: 94 ug/dL (ref 45–182)
Saturation Ratios: 29 % (ref 17.9–39.5)
TIBC: 329 ug/dL (ref 250–450)
UIBC: 235 ug/dL (ref 117–376)

## 2021-12-23 LAB — FERRITIN: Ferritin: 85 ng/mL (ref 24–336)

## 2021-12-23 NOTE — Progress Notes (Signed)
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  PRIOR THERAPY: None  CURRENT THERAPY: Over-the-counter oral iron supplements.  INTERVAL HISTORY: Bryan Smith 66 y.o. male returns to the clinic today for follow-up visit.  The patient is feeling fine today with no concerning complaints.  He denied having any chest pain, shortness of breath, cough or hemoptysis.  He denied having any fever or chills.  He has no nausea, vomiting, diarrhea or constipation.  He has no headache or visual changes.  He has been tolerating over-the-counter oral iron tablets fairly well.  He is here today for evaluation with repeat blood work.  He donated blood only once this year.  MEDICAL HISTORY: Past Medical History:  Diagnosis Date   Allergy    ANEMIA-IRON DEFICIENCY 04/26/2007   BPH with elevated PSA    sees Dr. Harrell Gave Winter   Diabetes mellitus without complication ([place])    GERD 01/04/2007   Hypertension    LEUKOCYTOPENIA UNSPECIFIED 01/24/2009    ALLERGIES:  is allergic to lisinopril and penicillins.  MEDICATIONS:  Current Outpatient Medications  Medication Sig Dispense Refill   benzonatate (TESSALON PERLES) 100 MG capsule 1-2 capsules up to twice daily as needed for cough. 30 capsule 0   esomeprazole (NEXIUM) 40 MG capsule TAKE ONE CAPSULE BY MOUTH DAILY (Patient taking differently: Take 40 mg by mouth daily. PRN) 30 capsule 2   Ferrous Sulfate (IRON) 325 (65 Fe) MG TABS Take by mouth.     ipratropium (ATROVENT) 0.06 % nasal spray Place 2 sprays into both nostrils 4 (four) times daily. (Patient not taking: Reported on 11/05/2021) 15 mL 12   levocetirizine (XYZAL) 5 MG tablet Take 0.5 tablets (2.5 mg total) by mouth every evening. 14 tablet 0   losartan (COZAAR) 50 MG tablet TAKE 1 TABLET  BY MOUTH EVERY DAY 90 tablet 0   Multiple Vitamins-Minerals (CENTRUM SILVER PO) Take by mouth as needed.     promethazine (PHENERGAN) 25 MG tablet Take 1 tablet (25 mg total) by mouth every 4 (four) hours as needed for nausea or vomiting. (Patient not taking: Reported on 11/05/2021) 60 tablet 2   No current facility-administered medications for this visit.    SURGICAL HISTORY:  Past Surgical History:  Procedure Laterality Date   COLONOSCOPY  11/19/2016   per Dr. Fuller Plan, adenomatous polyps, repeat in 5 yrs     REVIEW OF SYSTEMS:  A comprehensive review of systems was negative.   PHYSICAL EXAMINATION: General appearance: alert, cooperative, and no distress Head: Normocephalic, without obvious abnormality, atraumatic Neck: no adenopathy, no JVD, supple, symmetrical, trachea midline, and thyroid not enlarged, symmetric, no tenderness/mass/nodules Lymph nodes: Cervical, supraclavicular, and axillary nodes normal. Resp: clear to auscultation bilaterally Back: symmetric, no curvature. ROM normal. No CVA tenderness. Cardio: regular rate and rhythm, S1, S2 normal, no murmur, click, rub or gallop GI: soft, non-tender; bowel sounds normal; no masses,  no organomegaly Extremities: extremities normal, atraumatic, no cyanosis or edema  ECOG PERFORMANCE STATUS: 0 - Asymptomatic  Blood pressure (!) 149/96, pulse 94, temperature (!) 97.5 F (36.4 C), temperature source Oral, resp. rate 16, weight 224 lb 8 oz (101.8 kg), SpO2 100 %.  LABORATORY DATA: Lab Results  Component Value Date   WBC 3.6 (L) 12/23/2021   HGB 14.5 12/23/2021   HCT 45.0 12/23/2021  MCV 82.0 12/23/2021   PLT 259 12/23/2021      Chemistry      Component Value Date/Time   NA 138 08/28/2021 0823   K 4.2 08/28/2021 0823   CL 105 08/28/2021 0823   CO2 26 08/28/2021 0823   BUN 13 08/28/2021 0823   CREATININE 1.32 08/28/2021 0823   CREATININE 1.30 (H) 07/04/2020 1322      Component Value Date/Time   CALCIUM 8.9  08/28/2021 0823   ALKPHOS 70 08/28/2021 0823   AST 15 08/28/2021 0823   AST 20 07/04/2020 1322   ALT 15 08/28/2021 0823   ALT 16 07/04/2020 1322   BILITOT 0.4 08/28/2021 0823   BILITOT 0.3 07/04/2020 1322       RADIOGRAPHIC STUDIES: No results found.  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  The patient is currently on oral iron tablets and he is feeling much better with improvement of his anemia.  His leukocytopenia also improved and his total white blood count is 3.6. I discussed the lab results with the patient I recommended for him to continue on observation and close monitoring by his primary care physician from now on. I will see this patient on as-needed basis in the future but there is no need for regular follow-up visit. The patient was advised to call if he has any concerning issues in the interval. The patient voices understanding of current disease status and treatment options and is in agreement with the current care plan.  All questions were answered. The patient knows to call the clinic with any problems, questions or concerns. We can certainly see the patient much sooner if necessary.  The total time spent in the appointment was 20 minutes.  Disclaimer: This note was dictated with voice recognition software. Similar sounding words can inadvertently be transcribed and may not be corrected upon review.

## 2021-12-23 NOTE — Addendum Note (Signed)
Addended by: Ardeen Garland on: 12/23/2021 04:08 PM   Modules accepted: Orders

## 2021-12-25 DIAGNOSIS — H43812 Vitreous degeneration, left eye: Secondary | ICD-10-CM | POA: Diagnosis not present

## 2022-01-13 ENCOUNTER — Ambulatory Visit (AMBULATORY_SURGERY_CENTER): Payer: Self-pay

## 2022-01-13 VITALS — Ht 70.0 in | Wt 219.0 lb

## 2022-01-13 DIAGNOSIS — Z86010 Personal history of colonic polyps: Secondary | ICD-10-CM

## 2022-01-13 MED ORDER — NA SULFATE-K SULFATE-MG SULF 17.5-3.13-1.6 GM/177ML PO SOLN: 1.0000 | Freq: Once | ORAL | 0 refills | Status: AC

## 2022-01-13 NOTE — Progress Notes (Signed)

## 2022-01-14 ENCOUNTER — Telehealth: Payer: Self-pay | Admitting: Gastroenterology

## 2022-01-14 DIAGNOSIS — Z86010 Personal history of colonic polyps: Secondary | ICD-10-CM

## 2022-01-14 MED ORDER — NA SULFATE-K SULFATE-MG SULF 17.5-3.13-1.6 GM/177ML PO SOLN: 1.0000 | Freq: Once | ORAL | 0 refills | Status: AC

## 2022-01-14 NOTE — Telephone Encounter (Signed)
Resent Suprep to [facility] ( I do not see that Angelina Sheriff has a [facility]).  LMOM that this was sent to this pharmacy and to call back if this is not his correct pharmacy

## 2022-01-14 NOTE — Telephone Encounter (Signed)
Inbound call from patient to update pharmacy for prep medication sent.  Preferred pharmacy is [facility] in [place]. Please advise.

## 2022-01-22 ENCOUNTER — Other Ambulatory Visit: Payer: Self-pay | Admitting: Family Medicine

## 2022-01-22 DIAGNOSIS — I10 Essential (primary) hypertension: Secondary | ICD-10-CM

## 2022-01-24 ENCOUNTER — Encounter: Payer: Self-pay | Admitting: Gastroenterology

## 2022-02-03 ENCOUNTER — Encounter: Payer: Self-pay | Admitting: Gastroenterology

## 2022-02-03 ENCOUNTER — Ambulatory Visit (AMBULATORY_SURGERY_CENTER): Payer: Medicare PPO | Admitting: Gastroenterology

## 2022-02-03 VITALS — BP 127/73 | HR 70 | Temp 96.4°F | Resp 13 | Ht 70.0 in | Wt 219.0 lb

## 2022-02-03 DIAGNOSIS — I10 Essential (primary) hypertension: Secondary | ICD-10-CM | POA: Diagnosis not present

## 2022-02-03 DIAGNOSIS — D124 Benign neoplasm of descending colon: Secondary | ICD-10-CM | POA: Diagnosis not present

## 2022-02-03 DIAGNOSIS — D122 Benign neoplasm of ascending colon: Secondary | ICD-10-CM

## 2022-02-03 DIAGNOSIS — Z09 Encounter for follow-up examination after completed treatment for conditions other than malignant neoplasm: Secondary | ICD-10-CM | POA: Diagnosis not present

## 2022-02-03 DIAGNOSIS — E119 Type 2 diabetes mellitus without complications: Secondary | ICD-10-CM | POA: Diagnosis not present

## 2022-02-03 DIAGNOSIS — Z86010 Personal history of colonic polyps: Secondary | ICD-10-CM

## 2022-02-03 HISTORY — PX: COLONOSCOPY: SHX174

## 2022-02-03 MED ORDER — SODIUM CHLORIDE 0.9 % IV SOLN: 500.0000 mL | Freq: Once | INTRAVENOUS | Status: DC

## 2022-02-03 NOTE — Progress Notes (Signed)
Pt's states no medical or surgical changes since previsit or office visit. 

## 2022-02-03 NOTE — Progress Notes (Signed)
Called to room to assist during endoscopic procedure.  Patient ID and intended procedure confirmed with present staff. Received instructions for my participation in the procedure from the performing physician.  

## 2022-02-03 NOTE — Op Note (Signed)
[place] Patient Name: Bryan Smith Procedure Date: 02/03/2022 10:33 AM MRN: [id number] Endoscopist: Ladene Artist , MD, [id number] Age: 66 Referring MD:  Date of Birth: [birth year] Gender: Male Account #: [id number] Procedure:                Colonoscopy Indications:              Surveillance: Personal history of adenomatous                            polyps on last colonoscopy 5 years ago Medicines:                Monitored Anesthesia Care Procedure:                Pre-Anesthesia Assessment:                           - Prior to the procedure, a History and Physical                            was performed, and patient medications and                            allergies were reviewed. The patient's tolerance of                            previous anesthesia was also reviewed. The risks                            and benefits of the procedure and the sedation                            options and risks were discussed with the patient.                            All questions were answered, and informed consent                            was obtained. Prior Anticoagulants: The patient has                            taken no anticoagulant or antiplatelet agents. ASA                            Grade Assessment: III - A patient with severe                            systemic disease. After reviewing the risks and                            benefits, the patient was deemed in satisfactory                            condition to undergo the procedure.  After obtaining informed consent, the colonoscope                            was passed under direct vision. Throughout the                            procedure, the patient's blood pressure, pulse, and                            oxygen saturations were monitored continuously. The                            Colonoscope was introduced through the anus and                            advanced to the  the cecum, identified by                            appendiceal orifice and ileocecal valve. The                            ileocecal valve, appendiceal orifice, and rectum                            were photographed. The quality of the bowel                            preparation was good. The colonoscopy was performed                            without difficulty. The patient tolerated the                            procedure well. Scope In: 10:37:50 AM Scope Out: 10:55:19 AM Scope Withdrawal Time: 0 hours 15 minutes 4 seconds  Total Procedure Duration: 0 hours 17 minutes 29 seconds  Findings:                 The perianal and digital rectal examinations were                            normal.                           Three sessile polyps were found in the descending                            colon (2) and ascending colon (1). The polyps were                            4 to 7 mm in size. These polyps were removed with a                            cold snare. Resection and retrieval were complete.  Multiple small-mouthed diverticula were found in                            the left colon. There was no evidence of                            diverticular bleeding.                           Internal hemorrhoids were found during                            retroflexion. The hemorrhoids were small and Grade                            I (internal hemorrhoids that do not prolapse).                           The exam was otherwise without abnormality on                            direct and retroflexion views. Complications:            No immediate complications. Estimated blood loss:                            None. Estimated Blood Loss:     Estimated blood loss: none. Impression:               - Three 4 to 7 mm polyps in the descending colon                            and in the ascending colon, removed with a cold                            snare. Resected and  retrieved.                           - Mild diverticulosis in the left colon.                           - Internal hemorrhoids.                           - The examination was otherwise normal on direct                            and retroflexion views. Recommendation:           - Repeat colonoscopy after studies are complete for                            surveillance based on pathology results.                           - Patient has a contact number available for  emergencies. The signs and symptoms of potential                            delayed complications were discussed with the                            patient. Return to normal activities tomorrow.                            Written discharge instructions were provided to the                            patient.                           - High fiber diet.                           - Continue present medications.                           - Await pathology results. Ladene Artist, MD 02/03/2022 11:03:51 AM This report has been signed electronically.

## 2022-02-03 NOTE — Patient Instructions (Signed)
Handout on Diverticulosis and polyps.  High fiber diet. Continue present medications.   YOU HAD AN ENDOSCOPIC PROCEDURE TODAY AT [facility]:   Refer to the procedure report that was given to you for any specific questions about what was found during the examination.  If the procedure report does not answer your questions, please call your gastroenterologist to clarify.  If you requested that your care partner not be given the details of your procedure findings, then the procedure report has been included in a sealed envelope for you to review at your convenience later.  YOU SHOULD EXPECT: Some feelings of bloating in the abdomen. Passage of more gas than usual.  Walking can help get rid of the air that was put into your GI tract during the procedure and reduce the bloating. If you had a lower endoscopy (such as a colonoscopy or flexible sigmoidoscopy) you may notice spotting of blood in your stool or on the toilet paper. If you underwent a bowel prep for your procedure, you may not have a normal bowel movement for a few days.  Please Note:  You might notice some irritation and congestion in your nose or some drainage.  This is from the oxygen used during your procedure.  There is no need for concern and it should clear up in a day or so.  SYMPTOMS TO REPORT IMMEDIATELY:  Following lower endoscopy (colonoscopy or flexible sigmoidoscopy):  Excessive amounts of blood in the stool  Significant tenderness or worsening of abdominal pains  Swelling of the abdomen that is new, acute  Fever of 100F or higher   For urgent or emergent issues, a gastroenterologist can be reached at any hour by calling [phone]. Do not use MyChart messaging for urgent concerns.    DIET:  We do recommend a small meal at first, but then you may proceed to your regular diet.  Drink plenty of fluids but you should avoid alcoholic beverages for 24 hours.  ACTIVITY:  You should plan to take it easy for  the rest of today and you should NOT DRIVE or use heavy machinery until tomorrow (because of the sedation medicines used during the test).    FOLLOW UP: Our staff will call the number listed on your records the next business day following your procedure.  We will call around 7:15- 8:00 am to check on you and address any questions or concerns that you may have regarding the information given to you following your procedure. If we do not reach you, we will leave a message.     If any biopsies were taken you will be contacted by phone or by letter within the next 1-3 weeks.  Please call us at [phone] if you have not heard about the biopsies in 3 weeks.    SIGNATURES/CONFIDENTIALITY: You and/or your care partner have signed paperwork which will be entered into your electronic medical record.  These signatures attest to the fact that that the information above on your After Visit Summary has been reviewed and is understood.  Full responsibility of the confidentiality of this discharge information lies with you and/or your care-partner.

## 2022-02-03 NOTE — Progress Notes (Signed)
Pt resting comfortably. VSS. Airway intact. SBAR complete to RN. All questions answered.   

## 2022-02-03 NOTE — Progress Notes (Signed)
History & Physical  Primary Care Physician:  Laurey Morale, MD Primary Gastroenterologist: Lucio Edward, MD  CHIEF COMPLAINT:  Personal history of colon polyps   HPI: Bryan Smith is a 66 y.o. male with a personal history of adenomatous colon polyps for surveillance colonoscopy.   Past Medical History:  Diagnosis Date   Allergy    ANEMIA-IRON DEFICIENCY 04/26/2007   BPH with elevated PSA    sees Dr. Harrell Gave Winter   GERD 01/04/2007   Hypertension    LEUKOCYTOPENIA UNSPECIFIED 01/24/2009    Past Surgical History:  Procedure Laterality Date   COLONOSCOPY  11/19/2016   per Dr. Fuller Plan, adenomatous polyps, repeat in 5 yrs     Prior to Admission medications   Medication Sig Start Date End Date Taking? Authorizing Provider  Ferrous Sulfate (IRON) 325 (65 Fe) MG TABS Take by mouth.   Yes [provider]  losartan (COZAAR) 50 MG tablet TAKE 1 TABLET BY MOUTH EVERY DAY 01/22/22  Yes Laurey Morale, MD  esomeprazole ([place]) 40 MG capsule TAKE ONE CAPSULE BY MOUTH DAILY Patient not taking: Reported on 01/13/2022 10/08/21   Laurey Morale, MD  Multiple Vitamins-Minerals (CENTRUM SILVER PO) Take by mouth as needed.    [provider]    Current Outpatient Medications  Medication Sig Dispense Refill   Ferrous Sulfate (IRON) 325 (65 Fe) MG TABS Take by mouth.     losartan (COZAAR) 50 MG tablet TAKE 1 TABLET BY MOUTH EVERY DAY 90 tablet 0   esomeprazole (NEXIUM) 40 MG capsule TAKE ONE CAPSULE BY MOUTH DAILY (Patient not taking: Reported on 01/13/2022) 30 capsule 2   Multiple Vitamins-Minerals (CENTRUM SILVER PO) Take by mouth as needed.     Current Facility-Administered Medications  Medication Dose Route Frequency Provider Last Rate Last Admin   0.9 %  sodium chloride infusion  500 mL Intravenous Once Ladene Artist, MD        Allergies as of 02/03/2022 - Review Complete 02/03/2022  Allergen Reaction Noted   Lisinopril  10/04/2013   Penicillins   09/07/2021    Family History  Problem Relation Age of Onset   Colon polyps Mother    Arthritis Mother    Colon cancer Mother    Esophageal cancer Neg Hx    Rectal cancer Neg Hx    Stomach cancer Neg Hx     Social History   Socioeconomic History   Marital status: Single    Spouse name: Not on file   Number of children: Not on file   Years of education: Not on file   Highest education level: Bachelor's degree (e.g., BA, AB, BS)  Occupational History   Occupation: retired  Tobacco Use   Smoking status: Never   Smokeless tobacco: Never  Vaping Use   Vaping Use: Never used  Substance and Sexual Activity   Alcohol use: No    Alcohol/week: 0.0 standard drinks of alcohol   Drug use: No   Sexual activity: Not on file  Other Topics Concern   Not on file  Social History Narrative   Not on file   Social Determinants of Health   Financial Resource Strain: Low Risk  (05/28/2021)   Overall Financial Resource Strain (CARDIA)    Difficulty of Paying Living Expenses: Not hard at all  Food Insecurity: No Food Insecurity (05/28/2021)   Hunger Vital Sign    Worried About Running Out of Food in the Last Year: Never true    Ran Out  of Food in the Last Year: Never true  Transportation Needs: No Transportation Needs (05/28/2021)   PRAPARE - Hydrologist (Medical): No    Lack of Transportation (Non-Medical): No  Physical Activity: Sufficiently Active (05/28/2021)   Exercise Vital Sign    Days of Exercise per Week: 4 days    Minutes of Exercise per Session: 60 min  Stress: No Stress Concern Present (05/28/2021)   [place]    Feeling of Stress : Not at all  Social Connections: Moderately Integrated (02/12/2021)   Social Connection and Isolation Panel [NHANES]    Frequency of Communication with Friends and Family: Three times a week    Frequency of Social Gatherings with Friends and Family: Twice  a week    Attends Religious Services: More than 4 times per year    Active Member of [employer] or Organizations: Yes    Attends Music therapist: More than 4 times per year    Marital Status: Never married  Intimate Partner Violence: Not At Risk (05/15/2020)   Humiliation, Afraid, Rape, and Kick questionnaire    Fear of Current or Ex-Partner: No    Emotionally Abused: No    Physically Abused: No    Sexually Abused: No    Review of Systems:  All systems reviewed were negative except where noted in HPI.   Physical Exam: General:  Alert, well-developed, in NAD Head:  Normocephalic and atraumatic. Eyes:  Sclera clear, no icterus.   Conjunctiva pink. Ears:  Normal auditory acuity. Mouth:  No deformity or lesions.  Neck:  Supple; no masses . Lungs:  Clear throughout to auscultation.   No wheezes, crackles, or rhonchi. No acute distress. Heart:  Regular rate and rhythm; no murmurs. Abdomen:  Soft, nondistended, nontender. No masses, hepatomegaly. No obvious masses.  Normal bowel .    Rectal:  Deferred   Msk:  Symmetrical without gross deformities.. Pulses:  Normal pulses noted. Extremities:  Without edema. Neurologic:  Alert and  oriented x4;  grossly normal neurologically. Skin:  Intact without significant lesions or rashes. Cervical Nodes:  No significant cervical adenopathy. Psych:  Alert and cooperative. Normal mood and affect.    Impression / Plan:   Personal history of adenomatous colon polyps for surveillance colonoscopy.  Pricilla Riffle. Fuller Plan  02/03/2022, 10:22 AM See Shea Evans, [facility], to contact our on call provider

## 2022-02-04 ENCOUNTER — Telehealth: Payer: Self-pay

## 2022-02-04 NOTE — Telephone Encounter (Signed)
Left message on answering machine. 

## 2022-02-19 ENCOUNTER — Encounter: Payer: Self-pay | Admitting: Gastroenterology

## 2022-04-10 DIAGNOSIS — M9901 Segmental and somatic dysfunction of cervical region: Secondary | ICD-10-CM | POA: Diagnosis not present

## 2022-04-10 DIAGNOSIS — M5412 Radiculopathy, cervical region: Secondary | ICD-10-CM | POA: Diagnosis not present

## 2022-04-10 DIAGNOSIS — M9902 Segmental and somatic dysfunction of thoracic region: Secondary | ICD-10-CM | POA: Diagnosis not present

## 2022-04-10 DIAGNOSIS — M542 Cervicalgia: Secondary | ICD-10-CM | POA: Diagnosis not present

## 2022-04-15 DIAGNOSIS — M5412 Radiculopathy, cervical region: Secondary | ICD-10-CM | POA: Diagnosis not present

## 2022-04-15 DIAGNOSIS — M9902 Segmental and somatic dysfunction of thoracic region: Secondary | ICD-10-CM | POA: Diagnosis not present

## 2022-04-15 DIAGNOSIS — M542 Cervicalgia: Secondary | ICD-10-CM | POA: Diagnosis not present

## 2022-04-15 DIAGNOSIS — M9901 Segmental and somatic dysfunction of cervical region: Secondary | ICD-10-CM | POA: Diagnosis not present

## 2022-04-17 DIAGNOSIS — M5412 Radiculopathy, cervical region: Secondary | ICD-10-CM | POA: Diagnosis not present

## 2022-04-17 DIAGNOSIS — M9902 Segmental and somatic dysfunction of thoracic region: Secondary | ICD-10-CM | POA: Diagnosis not present

## 2022-04-17 DIAGNOSIS — M542 Cervicalgia: Secondary | ICD-10-CM | POA: Diagnosis not present

## 2022-04-17 DIAGNOSIS — M9901 Segmental and somatic dysfunction of cervical region: Secondary | ICD-10-CM | POA: Diagnosis not present

## 2022-04-20 ENCOUNTER — Other Ambulatory Visit: Payer: Self-pay | Admitting: Family Medicine

## 2022-04-20 DIAGNOSIS — I10 Essential (primary) hypertension: Secondary | ICD-10-CM

## 2022-04-22 DIAGNOSIS — M542 Cervicalgia: Secondary | ICD-10-CM | POA: Diagnosis not present

## 2022-04-22 DIAGNOSIS — M5412 Radiculopathy, cervical region: Secondary | ICD-10-CM | POA: Diagnosis not present

## 2022-04-22 DIAGNOSIS — M9902 Segmental and somatic dysfunction of thoracic region: Secondary | ICD-10-CM | POA: Diagnosis not present

## 2022-04-22 DIAGNOSIS — M9901 Segmental and somatic dysfunction of cervical region: Secondary | ICD-10-CM | POA: Diagnosis not present

## 2022-04-24 DIAGNOSIS — M5412 Radiculopathy, cervical region: Secondary | ICD-10-CM | POA: Diagnosis not present

## 2022-04-24 DIAGNOSIS — M9902 Segmental and somatic dysfunction of thoracic region: Secondary | ICD-10-CM | POA: Diagnosis not present

## 2022-04-24 DIAGNOSIS — M9901 Segmental and somatic dysfunction of cervical region: Secondary | ICD-10-CM | POA: Diagnosis not present

## 2022-04-24 DIAGNOSIS — M542 Cervicalgia: Secondary | ICD-10-CM | POA: Diagnosis not present

## 2022-04-25 ENCOUNTER — Encounter: Payer: Self-pay | Admitting: Family Medicine

## 2022-04-25 NOTE — Telephone Encounter (Signed)
Please advise 

## 2022-04-25 NOTE — Telephone Encounter (Signed)
Yes but don't take them at the same time (separate by at least one hour)

## 2022-04-29 DIAGNOSIS — M5412 Radiculopathy, cervical region: Secondary | ICD-10-CM | POA: Diagnosis not present

## 2022-04-29 DIAGNOSIS — M542 Cervicalgia: Secondary | ICD-10-CM | POA: Diagnosis not present

## 2022-04-29 DIAGNOSIS — M9902 Segmental and somatic dysfunction of thoracic region: Secondary | ICD-10-CM | POA: Diagnosis not present

## 2022-04-29 DIAGNOSIS — M9901 Segmental and somatic dysfunction of cervical region: Secondary | ICD-10-CM | POA: Diagnosis not present

## 2022-05-01 DIAGNOSIS — M5412 Radiculopathy, cervical region: Secondary | ICD-10-CM | POA: Diagnosis not present

## 2022-05-01 DIAGNOSIS — M542 Cervicalgia: Secondary | ICD-10-CM | POA: Diagnosis not present

## 2022-05-01 DIAGNOSIS — M9902 Segmental and somatic dysfunction of thoracic region: Secondary | ICD-10-CM | POA: Diagnosis not present

## 2022-05-01 DIAGNOSIS — M9901 Segmental and somatic dysfunction of cervical region: Secondary | ICD-10-CM | POA: Diagnosis not present

## 2022-05-05 DIAGNOSIS — M5412 Radiculopathy, cervical region: Secondary | ICD-10-CM | POA: Diagnosis not present

## 2022-05-05 DIAGNOSIS — M9901 Segmental and somatic dysfunction of cervical region: Secondary | ICD-10-CM | POA: Diagnosis not present

## 2022-05-05 DIAGNOSIS — M542 Cervicalgia: Secondary | ICD-10-CM | POA: Diagnosis not present

## 2022-05-05 DIAGNOSIS — M9902 Segmental and somatic dysfunction of thoracic region: Secondary | ICD-10-CM | POA: Diagnosis not present

## 2022-05-08 DIAGNOSIS — M5412 Radiculopathy, cervical region: Secondary | ICD-10-CM | POA: Diagnosis not present

## 2022-05-08 DIAGNOSIS — M542 Cervicalgia: Secondary | ICD-10-CM | POA: Diagnosis not present

## 2022-05-08 DIAGNOSIS — M9901 Segmental and somatic dysfunction of cervical region: Secondary | ICD-10-CM | POA: Diagnosis not present

## 2022-05-08 DIAGNOSIS — M9902 Segmental and somatic dysfunction of thoracic region: Secondary | ICD-10-CM | POA: Diagnosis not present

## 2022-05-13 DIAGNOSIS — M5412 Radiculopathy, cervical region: Secondary | ICD-10-CM | POA: Diagnosis not present

## 2022-05-13 DIAGNOSIS — M9902 Segmental and somatic dysfunction of thoracic region: Secondary | ICD-10-CM | POA: Diagnosis not present

## 2022-05-13 DIAGNOSIS — M9901 Segmental and somatic dysfunction of cervical region: Secondary | ICD-10-CM | POA: Diagnosis not present

## 2022-05-13 DIAGNOSIS — M542 Cervicalgia: Secondary | ICD-10-CM | POA: Diagnosis not present

## 2022-05-20 DIAGNOSIS — M9902 Segmental and somatic dysfunction of thoracic region: Secondary | ICD-10-CM | POA: Diagnosis not present

## 2022-05-20 DIAGNOSIS — M5412 Radiculopathy, cervical region: Secondary | ICD-10-CM | POA: Diagnosis not present

## 2022-05-20 DIAGNOSIS — M542 Cervicalgia: Secondary | ICD-10-CM | POA: Diagnosis not present

## 2022-05-20 DIAGNOSIS — M9901 Segmental and somatic dysfunction of cervical region: Secondary | ICD-10-CM | POA: Diagnosis not present

## 2022-05-23 ENCOUNTER — Telehealth: Payer: Self-pay | Admitting: Family Medicine

## 2022-05-23 NOTE — Telephone Encounter (Signed)
Called patient to schedule Medicare Annual Wellness Visit (AWV). Left message for patient to call back and schedule Medicare Annual Wellness Visit (AWV).  Last date of AWV: 05/28/21  Lm asking pt to call  schedule change need to r/s 3/26 appt.  If pt wants in office please schedule on [facility] schedule on Wed or Friday  If any questions, please contact me at [phone].  Thank you ,  Barkley Boards AWV direct phone # [phone]

## 2022-05-23 NOTE — Telephone Encounter (Signed)
[place] to schedule their annual wellness visit. Appointment made for 06/04/22.  Barkley Boards AWV direct phone # [phone]   Patient returned my call  r/s appt to 3/27 Pt aware of appt date time change

## 2022-05-27 DIAGNOSIS — M9902 Segmental and somatic dysfunction of thoracic region: Secondary | ICD-10-CM | POA: Diagnosis not present

## 2022-05-27 DIAGNOSIS — M5412 Radiculopathy, cervical region: Secondary | ICD-10-CM | POA: Diagnosis not present

## 2022-05-27 DIAGNOSIS — M542 Cervicalgia: Secondary | ICD-10-CM | POA: Diagnosis not present

## 2022-05-27 DIAGNOSIS — M9901 Segmental and somatic dysfunction of cervical region: Secondary | ICD-10-CM | POA: Diagnosis not present

## 2022-06-03 ENCOUNTER — Ambulatory Visit: Payer: Medicare PPO

## 2022-06-03 DIAGNOSIS — M542 Cervicalgia: Secondary | ICD-10-CM | POA: Diagnosis not present

## 2022-06-03 DIAGNOSIS — M9902 Segmental and somatic dysfunction of thoracic region: Secondary | ICD-10-CM | POA: Diagnosis not present

## 2022-06-03 DIAGNOSIS — M5412 Radiculopathy, cervical region: Secondary | ICD-10-CM | POA: Diagnosis not present

## 2022-06-03 DIAGNOSIS — M9901 Segmental and somatic dysfunction of cervical region: Secondary | ICD-10-CM | POA: Diagnosis not present

## 2022-06-04 ENCOUNTER — Ambulatory Visit (INDEPENDENT_AMBULATORY_CARE_PROVIDER_SITE_OTHER): Payer: Medicare PPO

## 2022-06-04 VITALS — BP 124/64 | HR 60 | Temp 98.1°F | Ht 70.0 in | Wt 224.2 lb

## 2022-06-04 DIAGNOSIS — Z0000 Encounter for general adult medical examination without abnormal findings: Secondary | ICD-10-CM

## 2022-06-04 NOTE — Progress Notes (Signed)
Subjective:   Bryan Smith is a 67 y.o. male who presents for Medicare Annual/Subsequent preventive examination.  Review of Systems      Cardiac Risk Factors include: advanced age (>25men, >21 women);male gender;hypertension     Objective:    Today's Vitals   06/04/22 1346  BP: 124/64  Pulse: 60  Temp: 98.1 F (36.7 C)  TempSrc: Oral  SpO2: 99%  Weight: 224 lb 3.2 oz (101.7 kg)  Height: 5\' 10"  (1.778 m)   Body mass index is 32.17 kg/m.     06/04/2022    1:56 PM 05/28/2021    1:33 PM 06/07/2020   12:35 PM 05/15/2020    1:07 PM 06/10/2019    9:54 AM 11/19/2016    7:28 AM 11/05/2016    7:42 AM  Advanced Directives  Does Patient Have a Medical Advance Directive? Yes Yes No No No No No  Type of Paramedic of [place];Living will [place];Living will       Does patient want to make changes to medical advance directive? No - Patient declined        Copy of [place] in Chart? Yes - validated most recent copy scanned in chart (See row information) Yes - validated most recent copy scanned in chart (See row information)       Would patient like information on creating a medical advance directive?   No - Patient declined Yes (MAU/Ambulatory/Procedural Areas - Information given)       Current Medications (verified) Outpatient Encounter Medications as of 06/04/2022  Medication Sig   esomeprazole (NEXIUM) 40 MG capsule TAKE ONE CAPSULE BY MOUTH DAILY (Patient not taking: Reported on 01/13/2022)   Ferrous Sulfate (IRON) 325 (65 Fe) MG TABS Take by mouth.   losartan (COZAAR) 50 MG tablet TAKE 1 TABLET BY MOUTH EVERY DAY   Multiple Vitamins-Minerals (CENTRUM SILVER PO) Take by mouth as needed.   No facility-administered encounter medications on file as of 06/04/2022.    Allergies (verified) Lisinopril and Penicillins   History: Past Medical History:  Diagnosis Date   Allergy    ANEMIA-IRON DEFICIENCY 04/26/2007    BPH with elevated PSA    sees Dr. Harrell Gave [place]   GERD 01/04/2007   Hypertension    LEUKOCYTOPENIA UNSPECIFIED 01/24/2009   Past Surgical History:  Procedure Laterality Date   COLONOSCOPY  11/19/2016   per Dr. Fuller Plan, adenomatous polyps, repeat in 5 yrs    Family History  Problem Relation Age of Onset   Colon polyps Mother    Arthritis Mother    Colon cancer Mother    Esophageal cancer Neg Hx    Rectal cancer Neg Hx    Stomach cancer Neg Hx    Social History   Socioeconomic History   Marital status: Single    Spouse name: Not on file   Number of children: Not on file   Years of education: Not on file   Highest education level: Bachelor's degree (e.g., BA, AB, BS)  Occupational History   Occupation: retired  Tobacco Use   Smoking status: Never   Smokeless tobacco: Never  Vaping Use   Vaping Use: Never used  Substance and Sexual Activity   Alcohol use: No    Alcohol/week: 0.0 standard drinks of alcohol   Drug use: No   Sexual activity: Not on file  Other Topics Concern   Not on file  Social History Narrative   Not on file   Social  Determinants of Health   Financial Resource Strain: Low Risk  (06/04/2022)   Overall Financial Resource Strain (CARDIA)    Difficulty of Paying Living Expenses: Not hard at all  Food Insecurity: No Food Insecurity (06/04/2022)   Hunger Vital Sign    Worried About Running Out of Food in the Last Year: Never true    Ran Out of Food in the Last Year: Never true  Transportation Needs: No Transportation Needs (06/04/2022)   PRAPARE - Hydrologist (Medical): No    Lack of Transportation (Non-Medical): No  Physical Activity: Sufficiently Active (06/04/2022)   Exercise Vital Sign    Days of Exercise per Week: 4 days    Minutes of Exercise per Session: 110 min  Stress: No Stress Concern Present (06/04/2022)   [place]    Feeling of Stress : Not  at all  Social Connections: Moderately Integrated (06/04/2022)   Social Connection and Isolation Panel [NHANES]    Frequency of Communication with Friends and Family: More than three times a week    Frequency of Social Gatherings with Friends and Family: More than three times a week    Attends Religious Services: More than 4 times per year    Active Member of [employer] or Organizations: Yes    Attends Music therapist: More than 4 times per year    Marital Status: Never married    Tobacco Counseling Counseling given: Not Answered   Clinical Intake:  Pre-visit preparation completed: No  Diabetic?  No  Information entered by :: Rolene Arbour LPN Activities of Daily Living    06/04/2022    1:55 PM  In your present state of health, do you have any difficulty performing the following activities:  Hearing? 0  Vision? 0  Difficulty concentrating or making decisions? 0  Walking or climbing stairs? 0  Dressing or bathing? 0  Doing errands, shopping? 0  Preparing Food and eating ? N  Using the Toilet? N  In the past six months, have you accidently leaked urine? N  Do you have problems with loss of bowel control? N  Managing your Medications? N  Managing your Finances? N  Housekeeping or managing your Housekeeping? N    Patient Care Team: Laurey Morale, MD as PCP - General  Indicate any recent Medical Services you may have received from other than [facility] providers in the past year (date may be approximate).     Assessment:   This is a routine wellness examination for [name].  Hearing/Vision screen Hearing Screening - Comments:: Denies hearing difficulties   Vision Screening - Comments:: Wears Contacts glasses - up to date with routine eye exams with  Dr Peter Garter  Dietary issues and exercise activities discussed: Current Exercise Habits: Home exercise routine, Type of exercise: walking, Time (Minutes): > 60, Frequency (Times/Week): 4, Weekly Exercise (Minutes/Week):  0, Intensity: Moderate, Exercise limited by: None identified   Goals Addressed               This Visit's Progress     Patient Stated (pt-stated)        Stay healthy.       Depression Screen    06/04/2022    1:53 PM 08/28/2021    8:35 AM 08/28/2021    8:09 AM 06/19/2021    4:15 PM 05/28/2021    1:33 PM 06/27/2020    8:53 AM 05/15/2020    1:05 PM  PHQ  2/9 Scores  PHQ - 2 Score 0 0 0 0 0 0 1  PHQ- 9 Score  0 0 1  1     Fall Risk    06/04/2022    1:56 PM 05/31/2022   12:12 PM 08/28/2021    8:35 AM 08/28/2021    8:09 AM 06/19/2021    4:14 PM  Fall Risk   Falls in the past year? 0 0 0 0 0  Number falls in past yr: 0  0 0 0  Injury with Fall? 0  0 0 0  Risk for fall due to : No Fall Risks  No Fall Risks No Fall Risks No Fall Risks  Follow up Falls prevention discussed  Falls evaluation completed Falls evaluation completed Falls evaluation completed    [place]:  Any stairs in or around the home? No  If so, are there any without handrails? No  Home free of loose throw rugs in walkways, pet beds, electrical cords, etc? Yes  Adequate lighting in your home to reduce risk of falls? Yes   ASSISTIVE DEVICES UTILIZED TO PREVENT FALLS:  Life alert? No  Use of a cane, walker or w/c? No  Grab bars in the bathroom? No  Shower chair or bench in shower? No  Elevated toilet seat or a handicapped toilet? No   TIMED UP AND GO:  Was the test performed? Yes .  Length of time to ambulate 10 feet: 10 sec.   Gait steady and fast without use of assistive device  Cognitive Function:        06/04/2022    1:57 PM 05/28/2021    1:34 PM 05/15/2020    1:12 PM  6CIT Screen  What Year? 0 points 0 points 0 points  What month? 0 points 0 points 0 points  What time? 0 points 0 points   Count back from 20 0 points 0 points 0 points  Months in reverse 0 points 0 points 0 points  Repeat phrase 0 points 4 points 2 points  Total Score 0 points 4 points      Immunizations Immunization History  Administered Date(s) Administered   Fluad Quad(high Dose 65+) 12/21/2018, 11/14/2021   Influenza, High Dose Seasonal PF 11/22/2020   Influenza,inj,Quad PF,6+ Mos 01/17/2016, 12/10/2016, 12/24/2017   Influenza-Unspecified 12/13/2019   PFIZER(Purple Top)SARS-COV-2 Vaccination 04/02/2019, 05/03/2019, 12/30/2019   Pfizer Covid-19 Vaccine Bivalent Booster 64yrs & up 12/05/2020   Pneumococcal Conjugate-13 04/06/2018   Pneumococcal Polysaccharide-23 06/27/2020   Tdap 06/27/2020   Zoster Recombinat (Shingrix) 01/10/2019, 06/29/2019   Zoster, Live 08/14/2014    TDAP status: Up to date  Flu Vaccine status: Up to date  Pneumococcal vaccine status: Up to date  Covid-19 vaccine status: Completed vaccines  Qualifies for Shingles Vaccine? Yes   Zostavax completed Yes   Shingrix Completed?: Yes  Screening Tests Health Maintenance  Topic Date Due   FOOT EXAM  Never done   Diabetic kidney evaluation - Urine ACR  10/08/2012   HEMOGLOBIN A1C  02/27/2022   OPHTHALMOLOGY EXAM  08/28/2022   Diabetic kidney evaluation - eGFR measurement  08/29/2022   Medicare Annual Wellness (AWV)  06/04/2023   COLONOSCOPY (Pts 45-51yrs Insurance coverage will need to be confirmed)  02/03/2025   DTaP/Tdap/Td (2 - Td or Tdap) 06/28/2030   Pneumonia Vaccine 22+ Years old  Completed   INFLUENZA VACCINE  Completed   COVID-19 Vaccine  Completed   Hepatitis C Screening  Completed   Zoster  Vaccines- Shingrix  Completed   HPV VACCINES  Aged Out    Health Maintenance  Health Maintenance Due  Topic Date Due   FOOT EXAM  Never done   Diabetic kidney evaluation - Urine ACR  10/08/2012   HEMOGLOBIN A1C  02/27/2022    Colorectal cancer screening: Type of screening: Colonoscopy. Completed 02/03/22. Repeat every 3 years  Lung Cancer Screening: (Low Dose CT Chest recommended if Age 68-80 years, 30 pack-year currently smoking OR have quit w/in 15years.) does not qualify.      Additional Screening:  Hepatitis C Screening: does qualify; Completed 07/04/20  Vision Screening: Recommended annual ophthalmology exams for early detection of glaucoma and other disorders of the eye. Is the patient up to date with their annual eye exam?  Yes  Who is the provider or what is the name of the office in which the patient attends annual eye exams? Dr Clydene Fake If pt is not established with a provider, would they like to be referred to a provider to establish care? No .   Dental Screening: Recommended annual dental exams for proper oral hygiene  Community Resource Referral / Chronic Care Management:  CRR required this visit?  No   CCM required this visit?  No      Plan:     I have personally reviewed and noted the following in the patient's chart:   Medical and social history Use of alcohol, tobacco or illicit drugs  Current medications and supplements including opioid prescriptions. Patient is not currently taking opioid prescriptions. Functional ability and status Nutritional status Physical activity Advanced directives List of other physicians Hospitalizations, surgeries, and ER visits in previous 12 months Vitals Screenings to include cognitive, depression, and falls Referrals and appointments  In addition, I have reviewed and discussed with patient certain preventive protocols, quality metrics, and best practice recommendations. A written personalized care plan for preventive services as well as general preventive health recommendations were provided to patient.     Criselda Peaches, LPN   [id number]   Nurse Notes: None

## 2022-06-04 NOTE — Patient Instructions (Addendum)
[name] , Thank you for taking time to come for your Medicare Wellness Visit. I appreciate your ongoing commitment to your health goals. Please review the following plan we discussed and let me know if I can assist you in the future.   These are the goals we discussed:  Goals       Patient Stated (pt-stated)      Stay healthy.      Patient Stated      05/28/2021, lose some inches around the waist        This is a list of the screening recommended for you and due dates:  Health Maintenance  Topic Date Due   Complete foot exam   Never done   Yearly kidney health urinalysis for diabetes  10/08/2012   Hemoglobin A1C  02/27/2022   Eye exam for diabetics  08/28/2022   Yearly kidney function blood test for diabetes  08/29/2022   Medicare Annual Wellness Visit  06/04/2023   Colon Cancer Screening  02/03/2025   DTaP/Tdap/Td vaccine (2 - Td or Tdap) 06/28/2030   Pneumonia Vaccine  Completed   Flu Shot  Completed   COVID-19 Vaccine  Completed   Hepatitis C Screening: USPSTF Recommendation to screen - Ages 5-79 yo.  Completed   Zoster (Shingles) Vaccine  Completed   HPV Vaccine  Aged Out    Advanced directives: In chart  Conditions/risks identified: None  Next appointment: Follow up in one year for your annual wellness visit.   Preventive Care 69 Years and Older, Male  Preventive care refers to lifestyle choices and visits with your health care provider that can promote health and wellness. What does preventive care include? A yearly physical exam. This is also called an annual well check. Dental exams once or twice a year. Routine eye exams. Ask your health care provider how often you should have your eyes checked. Personal lifestyle choices, including: Daily care of your teeth and gums. Regular physical activity. Eating a healthy diet. Avoiding tobacco and drug use. Limiting alcohol use. Practicing safe sex. Taking low doses of aspirin every day. Taking vitamin and  mineral supplements as recommended by your health care provider. What happens during an annual well check? The services and screenings done by your health care provider during your annual well check will depend on your age, overall health, lifestyle risk factors, and family history of disease. Counseling  Your health care provider may ask you questions about your: Alcohol use. Tobacco use. Drug use. Emotional well-being. Home and relationship well-being. Sexual activity. Eating habits. History of falls. Memory and ability to understand (cognition). Work and work Statistician. Screening  You may have the following tests or measurements: Height, weight, and BMI. Blood pressure. Lipid and cholesterol levels. These may be checked every 5 years, or more frequently if you are over 70 years old. Skin check. Lung cancer screening. You may have this screening every year starting at age 33 if you have a 30-pack-year history of smoking and currently smoke or have quit within the past 15 years. Fecal occult blood test (FOBT) of the stool. You may have this test every year starting at age 35. Flexible sigmoidoscopy or colonoscopy. You may have a sigmoidoscopy every 5 years or a colonoscopy every 10 years starting at age 53. Prostate cancer screening. Recommendations will vary depending on your family history and other risks. Hepatitis C blood test. Hepatitis B blood test. Sexually transmitted disease (STD) testing. Diabetes screening. This is done by checking your blood  sugar (glucose) after you have not eaten for a while (fasting). You may have this done every 1-3 years. Abdominal aortic aneurysm (AAA) screening. You may need this if you are a current or former smoker. Osteoporosis. You may be screened starting at age 29 if you are at high risk. Talk with your health care provider about your test results, treatment options, and if necessary, the need for more tests. Vaccines  Your health care  provider may recommend certain vaccines, such as: Influenza vaccine. This is recommended every year. Tetanus, diphtheria, and acellular pertussis (Tdap, Td) vaccine. You may need a Td booster every 10 years. Zoster vaccine. You may need this after age 69. Pneumococcal 13-valent conjugate (PCV13) vaccine. One dose is recommended after age 69. Pneumococcal polysaccharide (PPSV23) vaccine. One dose is recommended after age 69. Talk to your health care provider about which screenings and vaccines you need and how often you need them. This information is not intended to replace advice given to you by your health care provider. Make sure you discuss any questions you have with your health care provider. Document Released: 03/23/2015 Document Revised: 11/14/2015 Document Reviewed: 12/26/2014 Elsevier Interactive Patient Education  2017 [place] Prevention in the Home Falls can cause injuries. They can happen to people of all ages. There are many things you can do to make your home safe and to help prevent falls. What can I do on the outside of my home? Regularly fix the edges of walkways and driveways and fix any cracks. Remove anything that might make you trip as you walk through a door, such as a raised step or threshold. Trim any bushes or trees on the path to your home. Use bright outdoor lighting. Clear any walking paths of anything that might make someone trip, such as rocks or tools. Regularly check to see if handrails are loose or broken. Make sure that both sides of any steps have handrails. Any raised decks and porches should have guardrails on the edges. Have any leaves, snow, or ice cleared regularly. Use sand or salt on walking paths during winter. Clean up any spills in your garage right away. This includes oil or grease spills. What can I do in the bathroom? Use night lights. Install grab bars by the toilet and in the tub and shower. Do not use towel bars as grab  bars. Use non-skid mats or decals in the tub or shower. If you need to sit down in the shower, use a plastic, non-slip stool. Keep the floor dry. Clean up any water that spills on the floor as soon as it happens. Remove soap buildup in the tub or shower regularly. Attach bath mats securely with double-sided non-slip rug tape. Do not have throw rugs and other things on the floor that can make you trip. What can I do in the bedroom? Use night lights. Make sure that you have a light by your bed that is easy to reach. Do not use any sheets or blankets that are too big for your bed. They should not hang down onto the floor. Have a firm chair that has side arms. You can use this for support while you get dressed. Do not have throw rugs and other things on the floor that can make you trip. What can I do in the kitchen? Clean up any spills right away. Avoid walking on wet floors. Keep items that you use a lot in easy-to-reach places. If you need to reach something above you,  use a strong step stool that has a grab bar. Keep electrical cords out of the way. Do not use floor polish or wax that makes floors slippery. If you must use wax, use non-skid floor wax. Do not have throw rugs and other things on the floor that can make you trip. What can I do with my stairs? Do not leave any items on the stairs. Make sure that there are handrails on both sides of the stairs and use them. Fix handrails that are broken or loose. Make sure that handrails are as long as the stairways. Check any carpeting to make sure that it is firmly attached to the stairs. Fix any carpet that is loose or worn. Avoid having throw rugs at the top or bottom of the stairs. If you do have throw rugs, attach them to the floor with carpet tape. Make sure that you have a light switch at the top of the stairs and the bottom of the stairs. If you do not have them, ask someone to add them for you. What else can I do to help prevent  falls? Wear shoes that: Do not have high heels. Have rubber bottoms. Are comfortable and fit you well. Are closed at the toe. Do not wear sandals. If you use a stepladder: Make sure that it is fully opened. Do not climb a closed stepladder. Make sure that both sides of the stepladder are locked into place. Ask someone to hold it for you, if possible. Clearly mark and make sure that you can see: Any grab bars or handrails. First and last steps. Where the edge of each step is. Use tools that help you move around (mobility aids) if they are needed. These include: Canes. Walkers. Scooters. Crutches. Turn on the lights when you go into a dark area. Replace any light bulbs as soon as they burn out. Set up your furniture so you have a clear path. Avoid moving your furniture around. If any of your floors are uneven, fix them. If there are any pets around you, be aware of where they are. Review your medicines with your doctor. Some medicines can make you feel dizzy. This can increase your chance of falling. Ask your doctor what other things that you can do to help prevent falls. This information is not intended to replace advice given to you by your health care provider. Make sure you discuss any questions you have with your health care provider. Document Released: 12/21/2008 Document Revised: 08/02/2015 Document Reviewed: 03/31/2014 Elsevier Interactive Patient Education  2017 [employer].

## 2022-06-10 DIAGNOSIS — M5412 Radiculopathy, cervical region: Secondary | ICD-10-CM | POA: Diagnosis not present

## 2022-06-10 DIAGNOSIS — M9902 Segmental and somatic dysfunction of thoracic region: Secondary | ICD-10-CM | POA: Diagnosis not present

## 2022-06-10 DIAGNOSIS — M542 Cervicalgia: Secondary | ICD-10-CM | POA: Diagnosis not present

## 2022-06-10 DIAGNOSIS — M9901 Segmental and somatic dysfunction of cervical region: Secondary | ICD-10-CM | POA: Diagnosis not present

## 2022-06-17 DIAGNOSIS — M9902 Segmental and somatic dysfunction of thoracic region: Secondary | ICD-10-CM | POA: Diagnosis not present

## 2022-06-17 DIAGNOSIS — M542 Cervicalgia: Secondary | ICD-10-CM | POA: Diagnosis not present

## 2022-06-17 DIAGNOSIS — M5412 Radiculopathy, cervical region: Secondary | ICD-10-CM | POA: Diagnosis not present

## 2022-06-17 DIAGNOSIS — M9901 Segmental and somatic dysfunction of cervical region: Secondary | ICD-10-CM | POA: Diagnosis not present

## 2022-06-24 DIAGNOSIS — M542 Cervicalgia: Secondary | ICD-10-CM | POA: Diagnosis not present

## 2022-06-24 DIAGNOSIS — M9901 Segmental and somatic dysfunction of cervical region: Secondary | ICD-10-CM | POA: Diagnosis not present

## 2022-06-24 DIAGNOSIS — M9902 Segmental and somatic dysfunction of thoracic region: Secondary | ICD-10-CM | POA: Diagnosis not present

## 2022-06-24 DIAGNOSIS — M5412 Radiculopathy, cervical region: Secondary | ICD-10-CM | POA: Diagnosis not present

## 2022-07-08 DIAGNOSIS — M542 Cervicalgia: Secondary | ICD-10-CM | POA: Diagnosis not present

## 2022-07-08 DIAGNOSIS — M5412 Radiculopathy, cervical region: Secondary | ICD-10-CM | POA: Diagnosis not present

## 2022-07-08 DIAGNOSIS — M9902 Segmental and somatic dysfunction of thoracic region: Secondary | ICD-10-CM | POA: Diagnosis not present

## 2022-07-08 DIAGNOSIS — M9901 Segmental and somatic dysfunction of cervical region: Secondary | ICD-10-CM | POA: Diagnosis not present

## 2022-07-15 DIAGNOSIS — H40012 Open angle with borderline findings, low risk, left eye: Secondary | ICD-10-CM | POA: Diagnosis not present

## 2022-07-17 ENCOUNTER — Other Ambulatory Visit: Payer: Self-pay | Admitting: Family Medicine

## 2022-07-17 DIAGNOSIS — I10 Essential (primary) hypertension: Secondary | ICD-10-CM

## 2022-07-21 DIAGNOSIS — R972 Elevated prostate specific antigen [PSA]: Secondary | ICD-10-CM | POA: Diagnosis not present

## 2022-07-22 DIAGNOSIS — H40012 Open angle with borderline findings, low risk, left eye: Secondary | ICD-10-CM | POA: Diagnosis not present

## 2022-07-22 DIAGNOSIS — M9902 Segmental and somatic dysfunction of thoracic region: Secondary | ICD-10-CM | POA: Diagnosis not present

## 2022-07-22 DIAGNOSIS — M9901 Segmental and somatic dysfunction of cervical region: Secondary | ICD-10-CM | POA: Diagnosis not present

## 2022-07-22 DIAGNOSIS — M5412 Radiculopathy, cervical region: Secondary | ICD-10-CM | POA: Diagnosis not present

## 2022-07-22 DIAGNOSIS — M542 Cervicalgia: Secondary | ICD-10-CM | POA: Diagnosis not present

## 2022-07-28 DIAGNOSIS — N400 Enlarged prostate without lower urinary tract symptoms: Secondary | ICD-10-CM | POA: Diagnosis not present

## 2022-08-05 DIAGNOSIS — M542 Cervicalgia: Secondary | ICD-10-CM | POA: Diagnosis not present

## 2022-08-05 DIAGNOSIS — M9902 Segmental and somatic dysfunction of thoracic region: Secondary | ICD-10-CM | POA: Diagnosis not present

## 2022-08-05 DIAGNOSIS — M9901 Segmental and somatic dysfunction of cervical region: Secondary | ICD-10-CM | POA: Diagnosis not present

## 2022-08-05 DIAGNOSIS — M5412 Radiculopathy, cervical region: Secondary | ICD-10-CM | POA: Diagnosis not present

## 2022-08-19 DIAGNOSIS — M9902 Segmental and somatic dysfunction of thoracic region: Secondary | ICD-10-CM | POA: Diagnosis not present

## 2022-08-19 DIAGNOSIS — M542 Cervicalgia: Secondary | ICD-10-CM | POA: Diagnosis not present

## 2022-08-19 DIAGNOSIS — M9901 Segmental and somatic dysfunction of cervical region: Secondary | ICD-10-CM | POA: Diagnosis not present

## 2022-08-19 DIAGNOSIS — M5412 Radiculopathy, cervical region: Secondary | ICD-10-CM | POA: Diagnosis not present

## 2022-09-01 ENCOUNTER — Ambulatory Visit (INDEPENDENT_AMBULATORY_CARE_PROVIDER_SITE_OTHER): Payer: Medicare PPO | Admitting: Family Medicine

## 2022-09-01 ENCOUNTER — Encounter: Payer: Self-pay | Admitting: Family Medicine

## 2022-09-01 VITALS — BP 120/78 | HR 86 | Temp 98.1°F | Ht 70.5 in | Wt 222.0 lb

## 2022-09-01 DIAGNOSIS — I10 Essential (primary) hypertension: Secondary | ICD-10-CM | POA: Diagnosis not present

## 2022-09-01 DIAGNOSIS — D508 Other iron deficiency anemias: Secondary | ICD-10-CM | POA: Diagnosis not present

## 2022-09-01 DIAGNOSIS — R972 Elevated prostate specific antigen [PSA]: Secondary | ICD-10-CM

## 2022-09-01 DIAGNOSIS — N400 Enlarged prostate without lower urinary tract symptoms: Secondary | ICD-10-CM | POA: Diagnosis not present

## 2022-09-01 DIAGNOSIS — R739 Hyperglycemia, unspecified: Secondary | ICD-10-CM | POA: Diagnosis not present

## 2022-09-01 DIAGNOSIS — K219 Gastro-esophageal reflux disease without esophagitis: Secondary | ICD-10-CM

## 2022-09-01 DIAGNOSIS — D709 Neutropenia, unspecified: Secondary | ICD-10-CM

## 2022-09-01 LAB — CBC WITH DIFFERENTIAL/PLATELET
Basophils Absolute: 0 10*3/uL (ref 0.0–0.1)
Basophils Relative: 0.9 % (ref 0.0–3.0)
Eosinophils Absolute: 0.1 10*3/uL (ref 0.0–0.7)
Eosinophils Relative: 2.5 % (ref 0.0–5.0)
HCT: 46.6 % (ref 39.0–52.0)
Hemoglobin: 14.9 g/dL (ref 13.0–17.0)
Lymphocytes Relative: 25.9 % (ref 12.0–46.0)
Lymphs Abs: 0.9 10*3/uL (ref 0.7–4.0)
MCHC: 32 g/dL (ref 30.0–36.0)
MCV: 80.9 fl (ref 78.0–100.0)
Monocytes Absolute: 0.6 10*3/uL (ref 0.1–1.0)
Monocytes Relative: 16.7 % — ABNORMAL HIGH (ref 3.0–12.0)
Neutro Abs: 1.8 10*3/uL (ref 1.4–7.7)
Neutrophils Relative %: 54 % (ref 43.0–77.0)
Platelets: 253 10*3/uL (ref 150.0–400.0)
RBC: 5.76 Mil/uL (ref 4.22–5.81)
RDW: 16.1 % — ABNORMAL HIGH (ref 11.5–15.5)
WBC: 3.3 10*3/uL — ABNORMAL LOW (ref 4.0–10.5)

## 2022-09-01 LAB — HEPATIC FUNCTION PANEL
ALT: 13 U/L (ref 0–53)
AST: 14 U/L (ref 0–37)
Albumin: 3.8 g/dL (ref 3.5–5.2)
Alkaline Phosphatase: 64 U/L (ref 39–117)
Bilirubin, Direct: 0.1 mg/dL (ref 0.0–0.3)
Total Bilirubin: 0.5 mg/dL (ref 0.2–1.2)
Total Protein: 6.5 g/dL (ref 6.0–8.3)

## 2022-09-01 LAB — BASIC METABOLIC PANEL
BUN: 17 mg/dL (ref 6–23)
CO2: 28 mEq/L (ref 19–32)
Calcium: 9.1 mg/dL (ref 8.4–10.5)
Chloride: 105 mEq/L (ref 96–112)
Creatinine, Ser: 1.26 mg/dL (ref 0.40–1.50)
GFR: 58.23 mL/min — ABNORMAL LOW (ref >60.00)
Glucose, Bld: 112 mg/dL — ABNORMAL HIGH (ref 70–99)
Potassium: 4.5 mEq/L (ref 3.5–5.1)
Sodium: 140 mEq/L (ref 135–145)

## 2022-09-01 LAB — TSH: TSH: 2.77 u[IU]/mL (ref 0.35–5.50)

## 2022-09-01 LAB — IBC + FERRITIN
Ferritin: 105.2 ng/mL (ref 22.0–322.0)
Iron: 77 ug/dL (ref 42–165)
Saturation Ratios: 22.6 % (ref 20.0–50.0)
TIBC: 340.2 ug/dL (ref 250.0–450.0)
Transferrin: 243 mg/dL (ref 212.0–360.0)

## 2022-09-01 LAB — LIPID PANEL
Cholesterol: 191 mg/dL (ref 0–200)
HDL: 37.4 mg/dL — ABNORMAL LOW (ref >39.00)
LDL Cholesterol: 120 mg/dL — ABNORMAL HIGH (ref 0–99)
NonHDL: 153.8
Total CHOL/HDL Ratio: 5
Triglycerides: 167 mg/dL — ABNORMAL HIGH (ref 0.0–149.0)
VLDL: 33.4 mg/dL (ref 0.0–40.0)

## 2022-09-01 LAB — HEMOGLOBIN A1C: Hgb A1c MFr Bld: 6.6 % — ABNORMAL HIGH (ref 4.6–6.5)

## 2022-09-01 NOTE — Progress Notes (Signed)
Subjective:    Patient ID: Bryan Smith, male    DOB: Apr 26, 1955, 67 y.o.   MRN: [id number]  HPI Here to follow up on issues. He feels well. He saw Dr. Liliane Shi on 07-21-22 and his urine was clear. His PSA was 2.08. Dr. Liliane Shi said he would not need to see him any more. His BP is stable. He is watching his diet and working out. His GERD is stable.    Review of Systems  Constitutional: Negative.   HENT: Negative.    Eyes: Negative.   Respiratory: Negative.    Cardiovascular: Negative.   Gastrointestinal: Negative.   Genitourinary: Negative.   Musculoskeletal: Negative.   Skin: Negative.   Neurological: Negative.   Psychiatric/Behavioral: Negative.         Objective:   Physical Exam Constitutional:      General: He is not in acute distress.    Appearance: Normal appearance. He is well-developed. He is not diaphoretic.  HENT:     Head: Normocephalic and atraumatic.     Right Ear: External ear normal.     Left Ear: External ear normal.     Nose: Nose normal.     Mouth/Throat:     Pharynx: No oropharyngeal exudate.  Eyes:     General: No scleral icterus.       Right eye: No discharge.        Left eye: No discharge.     Conjunctiva/sclera: Conjunctivae normal.     Pupils: Pupils are equal, round, and reactive to light.  Neck:     Thyroid: No thyromegaly.     Vascular: No JVD.     Trachea: No tracheal deviation.  Cardiovascular:     Rate and Rhythm: Normal rate and regular rhythm.     Pulses: Normal pulses.     Heart sounds: Normal heart sounds. No murmur heard.    No friction rub. No gallop.  Pulmonary:     Effort: Pulmonary effort is normal. No respiratory distress.     Breath sounds: Normal breath sounds. No wheezing or rales.  Chest:     Chest wall: No tenderness.  Abdominal:     General: Bowel sounds are normal. There is no distension.     Palpations: Abdomen is soft. There is no mass.     Tenderness: There is no abdominal tenderness. There is no guarding  or rebound.  Genitourinary:    Penis: No tenderness.   Musculoskeletal:        General: No tenderness. Normal range of motion.     Cervical back: Neck supple.  Lymphadenopathy:     Cervical: No cervical adenopathy.  Skin:    General: Skin is warm and dry.     Coloration: Skin is not pale.     Findings: No erythema or rash.  Neurological:     General: No focal deficit present.     Mental Status: He is alert and oriented to person, place, and time.     Cranial Nerves: No cranial nerve deficit.     Motor: No abnormal muscle tone.     Coordination: Coordination normal.     Deep Tendon Reflexes: Reflexes are normal and symmetric. Reflexes normal.  Psychiatric:        Mood and Affect: Mood normal.        Behavior: Behavior normal.        Thought Content: Thought content normal.        Judgment: Judgment normal.  Assessment & Plan:  He seems to be doing well. His HTN is controlled. His GERD is stable. His hematuria has resolved so he will no longer need to see Urology. Get fasting labs to check lipids, iron level, etc. We spent a total of ( 33  ) minutes reviewing records and discussing these issues.  Gershon Crane, MD

## 2022-09-02 DIAGNOSIS — M9901 Segmental and somatic dysfunction of cervical region: Secondary | ICD-10-CM | POA: Diagnosis not present

## 2022-09-02 DIAGNOSIS — M9902 Segmental and somatic dysfunction of thoracic region: Secondary | ICD-10-CM | POA: Diagnosis not present

## 2022-09-02 DIAGNOSIS — M5412 Radiculopathy, cervical region: Secondary | ICD-10-CM | POA: Diagnosis not present

## 2022-09-02 DIAGNOSIS — M542 Cervicalgia: Secondary | ICD-10-CM | POA: Diagnosis not present

## 2022-09-03 ENCOUNTER — Encounter: Payer: Medicare PPO | Admitting: Family Medicine

## 2022-09-10 ENCOUNTER — Other Ambulatory Visit: Payer: Self-pay | Admitting: Family Medicine

## 2022-09-16 DIAGNOSIS — M9902 Segmental and somatic dysfunction of thoracic region: Secondary | ICD-10-CM | POA: Diagnosis not present

## 2022-09-16 DIAGNOSIS — M5412 Radiculopathy, cervical region: Secondary | ICD-10-CM | POA: Diagnosis not present

## 2022-09-16 DIAGNOSIS — M9901 Segmental and somatic dysfunction of cervical region: Secondary | ICD-10-CM | POA: Diagnosis not present

## 2022-09-16 DIAGNOSIS — M542 Cervicalgia: Secondary | ICD-10-CM | POA: Diagnosis not present

## 2022-09-30 DIAGNOSIS — M9902 Segmental and somatic dysfunction of thoracic region: Secondary | ICD-10-CM | POA: Diagnosis not present

## 2022-09-30 DIAGNOSIS — M5412 Radiculopathy, cervical region: Secondary | ICD-10-CM | POA: Diagnosis not present

## 2022-09-30 DIAGNOSIS — M542 Cervicalgia: Secondary | ICD-10-CM | POA: Diagnosis not present

## 2022-09-30 DIAGNOSIS — M9901 Segmental and somatic dysfunction of cervical region: Secondary | ICD-10-CM | POA: Diagnosis not present

## 2022-10-14 DIAGNOSIS — M9902 Segmental and somatic dysfunction of thoracic region: Secondary | ICD-10-CM | POA: Diagnosis not present

## 2022-10-14 DIAGNOSIS — M5412 Radiculopathy, cervical region: Secondary | ICD-10-CM | POA: Diagnosis not present

## 2022-10-14 DIAGNOSIS — M9901 Segmental and somatic dysfunction of cervical region: Secondary | ICD-10-CM | POA: Diagnosis not present

## 2022-10-14 DIAGNOSIS — M542 Cervicalgia: Secondary | ICD-10-CM | POA: Diagnosis not present

## 2022-10-15 ENCOUNTER — Other Ambulatory Visit: Payer: Self-pay | Admitting: Family Medicine

## 2022-10-15 DIAGNOSIS — I10 Essential (primary) hypertension: Secondary | ICD-10-CM

## 2022-10-23 ENCOUNTER — Encounter: Payer: Self-pay | Admitting: Family Medicine

## 2022-10-28 DIAGNOSIS — M5412 Radiculopathy, cervical region: Secondary | ICD-10-CM | POA: Diagnosis not present

## 2022-10-28 DIAGNOSIS — M542 Cervicalgia: Secondary | ICD-10-CM | POA: Diagnosis not present

## 2022-10-28 DIAGNOSIS — M9902 Segmental and somatic dysfunction of thoracic region: Secondary | ICD-10-CM | POA: Diagnosis not present

## 2022-10-28 DIAGNOSIS — M9901 Segmental and somatic dysfunction of cervical region: Secondary | ICD-10-CM | POA: Diagnosis not present

## 2022-11-11 DIAGNOSIS — M9902 Segmental and somatic dysfunction of thoracic region: Secondary | ICD-10-CM | POA: Diagnosis not present

## 2022-11-11 DIAGNOSIS — M5412 Radiculopathy, cervical region: Secondary | ICD-10-CM | POA: Diagnosis not present

## 2022-11-11 DIAGNOSIS — M542 Cervicalgia: Secondary | ICD-10-CM | POA: Diagnosis not present

## 2022-11-11 DIAGNOSIS — M9901 Segmental and somatic dysfunction of cervical region: Secondary | ICD-10-CM | POA: Diagnosis not present

## 2022-11-24 DIAGNOSIS — M5412 Radiculopathy, cervical region: Secondary | ICD-10-CM | POA: Diagnosis not present

## 2022-11-24 DIAGNOSIS — M9902 Segmental and somatic dysfunction of thoracic region: Secondary | ICD-10-CM | POA: Diagnosis not present

## 2022-11-24 DIAGNOSIS — M9901 Segmental and somatic dysfunction of cervical region: Secondary | ICD-10-CM | POA: Diagnosis not present

## 2022-11-24 DIAGNOSIS — M542 Cervicalgia: Secondary | ICD-10-CM | POA: Diagnosis not present

## 2022-12-04 ENCOUNTER — Encounter: Payer: Self-pay | Admitting: Family Medicine

## 2022-12-05 NOTE — Telephone Encounter (Signed)
No he will never need another pneumonia vaccine. It is okay to mix Covid shots from different manufacturers

## 2022-12-09 DIAGNOSIS — M542 Cervicalgia: Secondary | ICD-10-CM | POA: Diagnosis not present

## 2022-12-09 DIAGNOSIS — M9901 Segmental and somatic dysfunction of cervical region: Secondary | ICD-10-CM | POA: Diagnosis not present

## 2022-12-09 DIAGNOSIS — M5412 Radiculopathy, cervical region: Secondary | ICD-10-CM | POA: Diagnosis not present

## 2022-12-09 DIAGNOSIS — M9902 Segmental and somatic dysfunction of thoracic region: Secondary | ICD-10-CM | POA: Diagnosis not present

## 2022-12-23 DIAGNOSIS — M542 Cervicalgia: Secondary | ICD-10-CM | POA: Diagnosis not present

## 2022-12-23 DIAGNOSIS — M5412 Radiculopathy, cervical region: Secondary | ICD-10-CM | POA: Diagnosis not present

## 2022-12-23 DIAGNOSIS — M9901 Segmental and somatic dysfunction of cervical region: Secondary | ICD-10-CM | POA: Diagnosis not present

## 2022-12-23 DIAGNOSIS — M9902 Segmental and somatic dysfunction of thoracic region: Secondary | ICD-10-CM | POA: Diagnosis not present

## 2023-01-06 DIAGNOSIS — M542 Cervicalgia: Secondary | ICD-10-CM | POA: Diagnosis not present

## 2023-01-06 DIAGNOSIS — M9902 Segmental and somatic dysfunction of thoracic region: Secondary | ICD-10-CM | POA: Diagnosis not present

## 2023-01-06 DIAGNOSIS — M9901 Segmental and somatic dysfunction of cervical region: Secondary | ICD-10-CM | POA: Diagnosis not present

## 2023-01-06 DIAGNOSIS — M5412 Radiculopathy, cervical region: Secondary | ICD-10-CM | POA: Diagnosis not present

## 2023-01-20 DIAGNOSIS — M9902 Segmental and somatic dysfunction of thoracic region: Secondary | ICD-10-CM | POA: Diagnosis not present

## 2023-01-20 DIAGNOSIS — M9901 Segmental and somatic dysfunction of cervical region: Secondary | ICD-10-CM | POA: Diagnosis not present

## 2023-01-20 DIAGNOSIS — M542 Cervicalgia: Secondary | ICD-10-CM | POA: Diagnosis not present

## 2023-01-20 DIAGNOSIS — M5412 Radiculopathy, cervical region: Secondary | ICD-10-CM | POA: Diagnosis not present

## 2023-01-26 DIAGNOSIS — H40012 Open angle with borderline findings, low risk, left eye: Secondary | ICD-10-CM | POA: Diagnosis not present

## 2023-02-09 ENCOUNTER — Other Ambulatory Visit: Payer: Self-pay | Admitting: Family Medicine

## 2023-02-09 DIAGNOSIS — M5412 Radiculopathy, cervical region: Secondary | ICD-10-CM | POA: Diagnosis not present

## 2023-02-09 DIAGNOSIS — I10 Essential (primary) hypertension: Secondary | ICD-10-CM

## 2023-02-09 DIAGNOSIS — M542 Cervicalgia: Secondary | ICD-10-CM | POA: Diagnosis not present

## 2023-02-09 DIAGNOSIS — M9902 Segmental and somatic dysfunction of thoracic region: Secondary | ICD-10-CM | POA: Diagnosis not present

## 2023-02-09 DIAGNOSIS — M9901 Segmental and somatic dysfunction of cervical region: Secondary | ICD-10-CM | POA: Diagnosis not present

## 2023-02-10 ENCOUNTER — Encounter: Payer: Self-pay | Admitting: Family Medicine

## 2023-02-11 NOTE — Telephone Encounter (Signed)
Call in a Zpack  ?

## 2023-02-12 MED ORDER — AZITHROMYCIN 250 MG PO TABS: ORAL_TABLET | ORAL | 0 refills | Status: AC

## 2023-02-24 DIAGNOSIS — M9901 Segmental and somatic dysfunction of cervical region: Secondary | ICD-10-CM | POA: Diagnosis not present

## 2023-02-24 DIAGNOSIS — H40012 Open angle with borderline findings, low risk, left eye: Secondary | ICD-10-CM | POA: Diagnosis not present

## 2023-02-24 DIAGNOSIS — H52222 Regular astigmatism, left eye: Secondary | ICD-10-CM | POA: Diagnosis not present

## 2023-02-24 DIAGNOSIS — H2513 Age-related nuclear cataract, bilateral: Secondary | ICD-10-CM | POA: Diagnosis not present

## 2023-02-24 DIAGNOSIS — H5213 Myopia, bilateral: Secondary | ICD-10-CM | POA: Diagnosis not present

## 2023-02-24 DIAGNOSIS — H524 Presbyopia: Secondary | ICD-10-CM | POA: Diagnosis not present

## 2023-02-24 DIAGNOSIS — M9902 Segmental and somatic dysfunction of thoracic region: Secondary | ICD-10-CM | POA: Diagnosis not present

## 2023-02-24 DIAGNOSIS — H43812 Vitreous degeneration, left eye: Secondary | ICD-10-CM | POA: Diagnosis not present

## 2023-02-24 DIAGNOSIS — M5412 Radiculopathy, cervical region: Secondary | ICD-10-CM | POA: Diagnosis not present

## 2023-02-24 DIAGNOSIS — M542 Cervicalgia: Secondary | ICD-10-CM | POA: Diagnosis not present

## 2023-02-25 ENCOUNTER — Encounter: Payer: Self-pay | Admitting: Family Medicine

## 2023-02-26 ENCOUNTER — Encounter: Payer: Self-pay | Admitting: Family Medicine

## 2023-02-26 NOTE — Telephone Encounter (Signed)
 Care team updated and letter sent for eye exam notes.

## 2023-02-27 MED ORDER — BENZONATATE 200 MG PO CAPS: 200.0000 mg | ORAL_CAPSULE | Freq: Four times a day (QID) | ORAL | 2 refills | Status: AC | PRN

## 2023-02-27 NOTE — Telephone Encounter (Signed)
Done

## 2023-03-09 DIAGNOSIS — M9901 Segmental and somatic dysfunction of cervical region: Secondary | ICD-10-CM | POA: Diagnosis not present

## 2023-03-09 DIAGNOSIS — M542 Cervicalgia: Secondary | ICD-10-CM | POA: Diagnosis not present

## 2023-03-09 DIAGNOSIS — M5412 Radiculopathy, cervical region: Secondary | ICD-10-CM | POA: Diagnosis not present

## 2023-03-09 DIAGNOSIS — M9902 Segmental and somatic dysfunction of thoracic region: Secondary | ICD-10-CM | POA: Diagnosis not present

## 2023-03-18 DIAGNOSIS — H0012 Chalazion right lower eyelid: Secondary | ICD-10-CM | POA: Diagnosis not present

## 2023-03-24 DIAGNOSIS — M5412 Radiculopathy, cervical region: Secondary | ICD-10-CM | POA: Diagnosis not present

## 2023-03-24 DIAGNOSIS — M542 Cervicalgia: Secondary | ICD-10-CM | POA: Diagnosis not present

## 2023-03-24 DIAGNOSIS — M9901 Segmental and somatic dysfunction of cervical region: Secondary | ICD-10-CM | POA: Diagnosis not present

## 2023-03-24 DIAGNOSIS — M9902 Segmental and somatic dysfunction of thoracic region: Secondary | ICD-10-CM | POA: Diagnosis not present

## 2023-04-07 DIAGNOSIS — M9902 Segmental and somatic dysfunction of thoracic region: Secondary | ICD-10-CM | POA: Diagnosis not present

## 2023-04-07 DIAGNOSIS — M9901 Segmental and somatic dysfunction of cervical region: Secondary | ICD-10-CM | POA: Diagnosis not present

## 2023-04-07 DIAGNOSIS — M542 Cervicalgia: Secondary | ICD-10-CM | POA: Diagnosis not present

## 2023-04-07 DIAGNOSIS — M5412 Radiculopathy, cervical region: Secondary | ICD-10-CM | POA: Diagnosis not present

## 2023-04-21 DIAGNOSIS — M9901 Segmental and somatic dysfunction of cervical region: Secondary | ICD-10-CM | POA: Diagnosis not present

## 2023-04-21 DIAGNOSIS — M5412 Radiculopathy, cervical region: Secondary | ICD-10-CM | POA: Diagnosis not present

## 2023-04-21 DIAGNOSIS — M9902 Segmental and somatic dysfunction of thoracic region: Secondary | ICD-10-CM | POA: Diagnosis not present

## 2023-04-21 DIAGNOSIS — M542 Cervicalgia: Secondary | ICD-10-CM | POA: Diagnosis not present

## 2023-05-05 ENCOUNTER — Other Ambulatory Visit: Payer: Self-pay | Admitting: Family Medicine

## 2023-05-05 DIAGNOSIS — M9902 Segmental and somatic dysfunction of thoracic region: Secondary | ICD-10-CM | POA: Diagnosis not present

## 2023-05-05 DIAGNOSIS — M9901 Segmental and somatic dysfunction of cervical region: Secondary | ICD-10-CM | POA: Diagnosis not present

## 2023-05-05 DIAGNOSIS — I10 Essential (primary) hypertension: Secondary | ICD-10-CM

## 2023-05-05 DIAGNOSIS — M542 Cervicalgia: Secondary | ICD-10-CM | POA: Diagnosis not present

## 2023-05-05 DIAGNOSIS — M5412 Radiculopathy, cervical region: Secondary | ICD-10-CM | POA: Diagnosis not present

## 2023-05-19 DIAGNOSIS — M9901 Segmental and somatic dysfunction of cervical region: Secondary | ICD-10-CM | POA: Diagnosis not present

## 2023-05-19 DIAGNOSIS — M9902 Segmental and somatic dysfunction of thoracic region: Secondary | ICD-10-CM | POA: Diagnosis not present

## 2023-05-19 DIAGNOSIS — M542 Cervicalgia: Secondary | ICD-10-CM | POA: Diagnosis not present

## 2023-05-19 DIAGNOSIS — M5412 Radiculopathy, cervical region: Secondary | ICD-10-CM | POA: Diagnosis not present

## 2023-06-01 DIAGNOSIS — M5412 Radiculopathy, cervical region: Secondary | ICD-10-CM | POA: Diagnosis not present

## 2023-06-01 DIAGNOSIS — M542 Cervicalgia: Secondary | ICD-10-CM | POA: Diagnosis not present

## 2023-06-01 DIAGNOSIS — M9902 Segmental and somatic dysfunction of thoracic region: Secondary | ICD-10-CM | POA: Diagnosis not present

## 2023-06-01 DIAGNOSIS — M9901 Segmental and somatic dysfunction of cervical region: Secondary | ICD-10-CM | POA: Diagnosis not present

## 2023-06-10 ENCOUNTER — Ambulatory Visit (INDEPENDENT_AMBULATORY_CARE_PROVIDER_SITE_OTHER): Payer: Medicare PPO

## 2023-06-10 VITALS — BP 120/64 | HR 71 | Temp 97.8°F | Ht 70.5 in | Wt 222.4 lb

## 2023-06-10 DIAGNOSIS — Z0000 Encounter for general adult medical examination without abnormal findings: Secondary | ICD-10-CM

## 2023-06-10 NOTE — Progress Notes (Signed)
 Subjective:   Bryan Smith is a 68 y.o. who presents for a Medicare Wellness preventive visit.  Visit Complete: In person    Persons Participating in Visit: Patient.  AWV Questionnaire: Yes: Patient Medicare AWV questionnaire was completed by the patient on 06/06/23; I have confirmed that all information answered by patient is correct and no changes since this date.  Cardiac Risk Factors include: advanced age (>77men, >60 women);male gender;hypertension     Objective:    Today's Vitals   06/10/23 1400  BP: 120/64  Pulse: 71  Temp: 97.8 F (36.6 C)  TempSrc: Oral  SpO2: 96%  Weight: 222 lb 6.4 oz (100.9 kg)  Height: 5' 10.5" (1.791 m)   Body mass index is 31.46 kg/m.     06/10/2023    2:22 PM 06/04/2022    1:56 PM 05/28/2021    1:33 PM 06/07/2020   12:35 PM 05/15/2020    1:07 PM 06/10/2019    9:54 AM 11/19/2016    7:28 AM  Advanced Directives  Does Patient Have a Medical Advance Directive? Yes Yes Yes No No No No  Type of Estate agent of [place];Living will Healthcare Power of [place];Living will Healthcare Power of [place];Living will      Does patient want to make changes to medical advance directive? No - Patient declined No - Patient declined       Copy of Healthcare Power of Attorney in Chart? Yes - validated most recent copy scanned in chart (See row information) Yes - validated most recent copy scanned in chart (See row information) Yes - validated most recent copy scanned in chart (See row information)      Would patient like information on creating a medical advance directive?    No - Patient declined Yes (MAU/Ambulatory/Procedural Areas - Information given)      Current Medications (verified) Outpatient Encounter Medications as of 06/10/2023  Medication Sig   benzonatate (TESSALON) 200 MG capsule Take 1 capsule (200 mg total) by mouth every 6 (six) hours as needed for cough.   esomeprazole (NEXIUM) 40 MG capsule TAKE 1 CAPSULE BY MOUTH  DAILY   Ferrous Sulfate (IRON) 325 (65 Fe) MG TABS Take by mouth. (Patient not taking: Reported on 09/01/2022)   losartan (COZAAR) 50 MG tablet TAKE 1 TABLET BY MOUTH EVERY DAY   Multiple Vitamins-Minerals (CENTRUM SILVER PO) Take by mouth as needed.   No facility-administered encounter medications on file as of 06/10/2023.    Allergies (verified) Lisinopril and Penicillins   History: Past Medical History:  Diagnosis Date   Allergy    ANEMIA-IRON DEFICIENCY 04/26/2007   BPH with elevated PSA    sees Dr. Cristal Deer Winter   GERD 01/04/2007   Hypertension    LEUKOCYTOPENIA UNSPECIFIED 01/24/2009   Past Surgical History:  Procedure Laterality Date   COLONOSCOPY  02/03/2022   per Dr. Russella Dar, adenomatous polyps, repeat in 3 yrs   Family History  Problem Relation Age of Onset   Colon polyps Mother    Arthritis Mother    Colon cancer Mother    Esophageal cancer Neg Hx    Rectal cancer Neg Hx    Stomach cancer Neg Hx    Social History   Socioeconomic History   Marital status: Single    Spouse name: Not on file   Number of children: Not on file   Years of education: Not on file   Highest education level: Bachelor's degree (e.g., BA, AB, BS)  Occupational History  Occupation: retired  Tobacco Use   Smoking status: Never   Smokeless tobacco: Never  Vaping Use   Vaping status: Never Used  Substance and Sexual Activity   Alcohol use: No    Alcohol/week: 0.0 standard drinks of alcohol   Drug use: No   Sexual activity: Not on file  Other Topics Concern   Not on file  Social History Narrative   Not on file   Social Drivers of Health   Financial Resource Strain: Low Risk  (06/10/2023)   Overall Financial Resource Strain (CARDIA)    Difficulty of Paying Living Expenses: Not hard at all  Food Insecurity: No Food Insecurity (06/10/2023)   Hunger Vital Sign    Worried About Running Out of Food in the Last Year: Never true    Ran Out of Food in the Last Year: Never true   Transportation Needs: No Transportation Needs (06/10/2023)   PRAPARE - Administrator, Civil Service (Medical): No    Lack of Transportation (Non-Medical): No  Physical Activity: Sufficiently Active (06/10/2023)   Exercise Vital Sign    Days of Exercise per Week: 5 days    Minutes of Exercise per Session: 90 min  Stress: No Stress Concern Present (06/10/2023)   [employer] of Occupational Health - Occupational Stress Questionnaire    Feeling of Stress : Not at all  Social Connections: Moderately Integrated (06/10/2023)   Social Connection and Isolation Panel [NHANES]    Frequency of Communication with Friends and Family: More than three times a week    Frequency of Social Gatherings with Friends and Family: Twice a week    Attends Religious Services: More than 4 times per year    Active Member of [employer] or Organizations: Yes    Attends Engineer, structural: More than 4 times per year    Marital Status: Never married    Tobacco Counseling Counseling given: Not Answered    Clinical Intake:  Pre-visit preparation completed: Yes  Pain : No/denies pain     BMI - recorded: 31.46 Nutritional Status: BMI > 30  Obese Nutritional Risks: None Diabetes: No  Lab Results  Component Value Date   HGBA1C 6.6 (H) 09/01/2022   HGBA1C 6.6 (H) 08/28/2021   HGBA1C 6.8 (H) 06/27/2020     How often do you need to have someone help you when you read instructions, pamphlets, or other written materials from your doctor or pharmacy?: 1 - Never  Interpreter Needed?: No  Information entered by :: Theresa Mulligan LPN   Activities of Daily Living     06/10/2023    2:20 PM 06/06/2023   11:24 AM  In your present state of health, do you have any difficulty performing the following activities:  Hearing? 0 0  Vision? 0 0  Difficulty concentrating or making decisions? 0 0  Walking or climbing stairs? 0 0  Dressing or bathing? 0 0  Doing errands, shopping? 0   Preparing Food  and eating ? N N  Using the Toilet? N N  In the past six months, have you accidently leaked urine? N N  Do you have problems with loss of bowel control? N N  Managing your Medications? N N  Managing your Finances? N N  Housekeeping or managing your Housekeeping? N N    Patient Care Team: Nelwyn Salisbury, MD as PCP - General Gelene Mink, OD as Referring Physician (Optometry)  Indicate any recent Medical Services you may have received from other than  [facility] providers in the past year (date may be approximate).     Assessment:   This is a routine wellness examination for Bryan Smith.  Hearing/Vision screen Hearing Screening - Comments:: Denies hearing difficulties   Vision Screening - Comments:: Wears rx glasses - up to date with routine eye exams with  Dr Sharlot Gowda   Goals Addressed               This Visit's Progress     Lose weight (pt-stated)        Get under 200lbs.       Depression Screen     06/10/2023    2:03 PM 09/01/2022    9:19 AM 06/04/2022    1:53 PM 08/28/2021    8:35 AM 08/28/2021    8:09 AM 06/19/2021    4:15 PM 05/28/2021    1:33 PM  PHQ 2/9 Scores  PHQ - 2 Score 0 0 0 0 0 0 0  PHQ- 9 Score  0  0 0 1     Fall Risk     06/10/2023    2:20 PM 06/06/2023   11:24 AM 09/01/2022    9:14 AM 06/04/2022    1:56 PM 05/31/2022   12:12 PM  Fall Risk   Falls in the past year? 0 0 0 0 0  Number falls in past yr: 0 0 0 0   Injury with Fall? 0 0 0 0   Risk for fall due to : No Fall Risks  No Fall Risks No Fall Risks   Follow up Falls prevention discussed;Falls evaluation completed  Falls evaluation completed Falls prevention discussed     MEDICARE RISK AT HOME:  Medicare Risk at Home Any stairs in or around the home?: No If so, are there any without handrails?: No Home free of loose throw rugs in walkways, pet beds, electrical cords, etc?: Yes Adequate lighting in your home to reduce risk of falls?: Yes Life alert?: No Use of a cane, walker or w/c?: No Grab bars in  the bathroom?: No Shower chair or bench in shower?: No Elevated toilet seat or a handicapped toilet?: No  TIMED UP AND GO:  Was the test performed?  Yes  Length of time to ambulate 10 feet: 10 sec Gait steady and fast without use of assistive device  Cognitive Function: 6CIT completed        06/10/2023    2:22 PM 06/04/2022    1:57 PM 05/28/2021    1:34 PM 05/15/2020    1:12 PM  6CIT Screen  What Year? 0 points 0 points 0 points 0 points  What month? 0 points 0 points 0 points 0 points  What time? 0 points 0 points 0 points   Count back from 20 0 points 0 points 0 points 0 points  Months in reverse 0 points 0 points 0 points 0 points  Repeat phrase 0 points 0 points 4 points 2 points  Total Score 0 points 0 points 4 points     Immunizations Immunization History  Administered Date(s) Administered   Fluad Quad(high Dose 65+) 12/21/2018, 11/14/2021   Influenza, High Dose Seasonal PF 11/22/2020, 11/17/2022   Influenza,inj,Quad PF,6+ Mos 01/17/2016, 12/10/2016, 12/24/2017   Influenza-Unspecified 12/13/2019   Moderna Covid-19 Fall Seasonal Vaccine 54yrs & older 12/03/2022   PFIZER(Purple Top)SARS-COV-2 Vaccination 04/02/2019, 05/03/2019, 12/30/2019   Pfizer Covid-19 Vaccine Bivalent Booster 65yrs & up 12/05/2020   Pfizer(Comirnaty)Fall Seasonal Vaccine 12 years and older 12/16/2021   Pneumococcal Conjugate-13 04/06/2018  Pneumococcal Polysaccharide-23 06/27/2020   Tdap 06/27/2020   Zoster Recombinant(Shingrix) 01/10/2019, 06/29/2019   Zoster, Live 08/14/2014    Screening Tests Health Maintenance  Topic Date Due   FOOT EXAM  Never done   Diabetic kidney evaluation - Urine ACR  10/08/2012   HEMOGLOBIN A1C  03/03/2023   COVID-19 Vaccine (7 - 2024-25 season) 06/02/2023   Diabetic kidney evaluation - eGFR measurement  09/01/2023   INFLUENZA VACCINE  10/09/2023   OPHTHALMOLOGY EXAM  02/08/2024   Medicare Annual Wellness (AWV)  06/09/2024   Colonoscopy  02/03/2025    DTaP/Tdap/Td (2 - Td or Tdap) 06/28/2030   Pneumonia Vaccine 66+ Years old  Completed   Hepatitis C Screening  Completed   Zoster Vaccines- Shingrix  Completed   HPV VACCINES  Aged Out    Health Maintenance  Health Maintenance Due  Topic Date Due   FOOT EXAM  Never done   Diabetic kidney evaluation - Urine ACR  10/08/2012   HEMOGLOBIN A1C  03/03/2023   COVID-19 Vaccine (7 - 2024-25 season) 06/02/2023   Health Maintenance Items Addressed:    Additional Screening:  Vision Screening: Recommended annual ophthalmology exams for early detection of glaucoma and other disorders of the eye.  Dental Screening: Recommended annual dental exams for proper oral hygiene  Community Resource Referral / Chronic Care Management: CRR required this visit?  No   CCM required this visit?  No     Plan:     I have personally reviewed and noted the following in the patient's chart:   Medical and social history Use of alcohol, tobacco or illicit drugs  Current medications and supplements including opioid prescriptions. Patient is not currently taking opioid prescriptions. Functional ability and status Nutritional status Physical activity Advanced directives List of other physicians Hospitalizations, surgeries, and ER visits in previous 12 months Vitals Screenings to include cognitive, depression, and falls Referrals and appointments  In addition, I have reviewed and discussed with patient certain preventive protocols, quality metrics, and best practice recommendations. A written personalized care plan for preventive services as well as general preventive health recommendations were provided to patient.     Tillie Rung, LPN   03/15/1094   After Visit Summary: (In Person-Printed) AVS printed and given to the patient  Notes: Nothing significant to report at this time.

## 2023-06-10 NOTE — Patient Instructions (Addendum)
 [name] , Thank you for taking time to come for your Medicare Wellness Visit. I appreciate your ongoing commitment to your health goals. Please review the following plan we discussed and let me know if I can assist you in the future.   Referrals/Orders/Follow-Ups/Clinician Recommendations:   This is a list of the screening recommended for you and due dates:  Health Maintenance  Topic Date Due   Complete foot exam   Never done   Yearly kidney health urinalysis for diabetes  10/08/2012   Hemoglobin A1C  03/03/2023   COVID-19 Vaccine (7 - 2024-25 season) 06/02/2023   Yearly kidney function blood test for diabetes  09/01/2023   Flu Shot  10/09/2023   Eye exam for diabetics  02/08/2024   Medicare Annual Wellness Visit  06/09/2024   Colon Cancer Screening  02/03/2025   DTaP/Tdap/Td vaccine (2 - Td or Tdap) 06/28/2030   Pneumonia Vaccine  Completed   Hepatitis C Screening  Completed   Zoster (Shingles) Vaccine  Completed   HPV Vaccine  Aged Out    Advanced directives: (In Chart) A copy of your advanced directives are scanned into your chart should your provider ever need it.  Next Medicare Annual Wellness Visit scheduled for next year: Yes

## 2023-06-15 DIAGNOSIS — M9901 Segmental and somatic dysfunction of cervical region: Secondary | ICD-10-CM | POA: Diagnosis not present

## 2023-06-15 DIAGNOSIS — M5412 Radiculopathy, cervical region: Secondary | ICD-10-CM | POA: Diagnosis not present

## 2023-06-15 DIAGNOSIS — M542 Cervicalgia: Secondary | ICD-10-CM | POA: Diagnosis not present

## 2023-06-15 DIAGNOSIS — M9902 Segmental and somatic dysfunction of thoracic region: Secondary | ICD-10-CM | POA: Diagnosis not present

## 2023-06-29 DIAGNOSIS — M9902 Segmental and somatic dysfunction of thoracic region: Secondary | ICD-10-CM | POA: Diagnosis not present

## 2023-06-29 DIAGNOSIS — M5412 Radiculopathy, cervical region: Secondary | ICD-10-CM | POA: Diagnosis not present

## 2023-06-29 DIAGNOSIS — M9901 Segmental and somatic dysfunction of cervical region: Secondary | ICD-10-CM | POA: Diagnosis not present

## 2023-06-29 DIAGNOSIS — M542 Cervicalgia: Secondary | ICD-10-CM | POA: Diagnosis not present

## 2023-07-13 DIAGNOSIS — M542 Cervicalgia: Secondary | ICD-10-CM | POA: Diagnosis not present

## 2023-07-13 DIAGNOSIS — M5412 Radiculopathy, cervical region: Secondary | ICD-10-CM | POA: Diagnosis not present

## 2023-07-13 DIAGNOSIS — M9901 Segmental and somatic dysfunction of cervical region: Secondary | ICD-10-CM | POA: Diagnosis not present

## 2023-07-13 DIAGNOSIS — M9902 Segmental and somatic dysfunction of thoracic region: Secondary | ICD-10-CM | POA: Diagnosis not present

## 2023-07-27 DIAGNOSIS — M9901 Segmental and somatic dysfunction of cervical region: Secondary | ICD-10-CM | POA: Diagnosis not present

## 2023-07-27 DIAGNOSIS — M5412 Radiculopathy, cervical region: Secondary | ICD-10-CM | POA: Diagnosis not present

## 2023-07-27 DIAGNOSIS — H40012 Open angle with borderline findings, low risk, left eye: Secondary | ICD-10-CM | POA: Diagnosis not present

## 2023-07-27 DIAGNOSIS — M9902 Segmental and somatic dysfunction of thoracic region: Secondary | ICD-10-CM | POA: Diagnosis not present

## 2023-07-27 DIAGNOSIS — M542 Cervicalgia: Secondary | ICD-10-CM | POA: Diagnosis not present

## 2023-07-29 ENCOUNTER — Other Ambulatory Visit: Payer: Self-pay | Admitting: Family Medicine

## 2023-07-29 DIAGNOSIS — I10 Essential (primary) hypertension: Secondary | ICD-10-CM

## 2023-08-10 DIAGNOSIS — M9902 Segmental and somatic dysfunction of thoracic region: Secondary | ICD-10-CM | POA: Diagnosis not present

## 2023-08-10 DIAGNOSIS — M9901 Segmental and somatic dysfunction of cervical region: Secondary | ICD-10-CM | POA: Diagnosis not present

## 2023-08-10 DIAGNOSIS — M542 Cervicalgia: Secondary | ICD-10-CM | POA: Diagnosis not present

## 2023-08-10 DIAGNOSIS — M5412 Radiculopathy, cervical region: Secondary | ICD-10-CM | POA: Diagnosis not present

## 2023-08-24 DIAGNOSIS — M9901 Segmental and somatic dysfunction of cervical region: Secondary | ICD-10-CM | POA: Diagnosis not present

## 2023-08-24 DIAGNOSIS — M542 Cervicalgia: Secondary | ICD-10-CM | POA: Diagnosis not present

## 2023-08-24 DIAGNOSIS — M9902 Segmental and somatic dysfunction of thoracic region: Secondary | ICD-10-CM | POA: Diagnosis not present

## 2023-08-24 DIAGNOSIS — M5412 Radiculopathy, cervical region: Secondary | ICD-10-CM | POA: Diagnosis not present

## 2023-09-04 ENCOUNTER — Ambulatory Visit: Payer: Self-pay | Admitting: Family Medicine

## 2023-09-04 ENCOUNTER — Encounter: Payer: Self-pay | Admitting: Family Medicine

## 2023-09-04 ENCOUNTER — Ambulatory Visit (INDEPENDENT_AMBULATORY_CARE_PROVIDER_SITE_OTHER): Admitting: Family Medicine

## 2023-09-04 VITALS — BP 110/70 | HR 82 | Temp 97.5°F | Ht 70.75 in | Wt 223.0 lb

## 2023-09-04 DIAGNOSIS — I10 Essential (primary) hypertension: Secondary | ICD-10-CM

## 2023-09-04 DIAGNOSIS — R972 Elevated prostate specific antigen [PSA]: Secondary | ICD-10-CM | POA: Diagnosis not present

## 2023-09-04 DIAGNOSIS — D709 Neutropenia, unspecified: Secondary | ICD-10-CM | POA: Diagnosis not present

## 2023-09-04 DIAGNOSIS — K219 Gastro-esophageal reflux disease without esophagitis: Secondary | ICD-10-CM

## 2023-09-04 DIAGNOSIS — R739 Hyperglycemia, unspecified: Secondary | ICD-10-CM | POA: Diagnosis not present

## 2023-09-04 DIAGNOSIS — N400 Enlarged prostate without lower urinary tract symptoms: Secondary | ICD-10-CM | POA: Diagnosis not present

## 2023-09-04 DIAGNOSIS — D508 Other iron deficiency anemias: Secondary | ICD-10-CM | POA: Diagnosis not present

## 2023-09-04 LAB — CBC WITH DIFFERENTIAL/PLATELET
Basophils Absolute: 0 10*3/uL (ref 0.0–0.1)
Basophils Relative: 0.9 % (ref 0.0–3.0)
Eosinophils Absolute: 0.1 10*3/uL (ref 0.0–0.7)
Eosinophils Relative: 2.1 % (ref 0.0–5.0)
HCT: 43.7 % (ref 39.0–52.0)
Hemoglobin: 14 g/dL (ref 13.0–17.0)
Lymphocytes Relative: 23.3 % (ref 12.0–46.0)
Lymphs Abs: 0.7 10*3/uL (ref 0.7–4.0)
MCHC: 32.1 g/dL (ref 30.0–36.0)
MCV: 80.1 fl (ref 78.0–100.0)
Monocytes Absolute: 0.5 10*3/uL (ref 0.1–1.0)
Monocytes Relative: 17.3 % — ABNORMAL HIGH (ref 3.0–12.0)
Neutro Abs: 1.8 10*3/uL (ref 1.4–7.7)
Neutrophils Relative %: 56.4 % (ref 43.0–77.0)
Platelets: 256 10*3/uL (ref 150.0–400.0)
RBC: 5.46 Mil/uL (ref 4.22–5.81)
RDW: 16.9 % — ABNORMAL HIGH (ref 11.5–15.5)
WBC: 3.1 10*3/uL — ABNORMAL LOW (ref 4.0–10.5)

## 2023-09-04 LAB — LIPID PANEL
Cholesterol: 177 mg/dL (ref 0–200)
HDL: 38.9 mg/dL — ABNORMAL LOW (ref >39.00)
LDL Cholesterol: 106 mg/dL — ABNORMAL HIGH (ref 0–99)
NonHDL: 137.66
Total CHOL/HDL Ratio: 5
Triglycerides: 158 mg/dL — ABNORMAL HIGH (ref 0.0–149.0)
VLDL: 31.6 mg/dL (ref 0.0–40.0)

## 2023-09-04 LAB — IBC + FERRITIN
Ferritin: 30.8 ng/mL (ref 22.0–322.0)
Iron: 73 ug/dL (ref 42–165)
Saturation Ratios: 19.8 % — ABNORMAL LOW (ref 20.0–50.0)
TIBC: 368.2 ug/dL (ref 250.0–450.0)
Transferrin: 263 mg/dL (ref 212.0–360.0)

## 2023-09-04 LAB — HEPATIC FUNCTION PANEL
ALT: 16 U/L (ref 0–53)
AST: 16 U/L (ref 0–37)
Albumin: 3.8 g/dL (ref 3.5–5.2)
Alkaline Phosphatase: 63 U/L (ref 39–117)
Bilirubin, Direct: 0.1 mg/dL (ref 0.0–0.3)
Total Bilirubin: 0.4 mg/dL (ref 0.2–1.2)
Total Protein: 6.1 g/dL (ref 6.0–8.3)

## 2023-09-04 LAB — BASIC METABOLIC PANEL WITH GFR
BUN: 11 mg/dL (ref 6–23)
CO2: 27 meq/L (ref 19–32)
Calcium: 8.8 mg/dL (ref 8.4–10.5)
Chloride: 106 meq/L (ref 96–112)
Creatinine, Ser: 1.25 mg/dL (ref 0.40–1.50)
GFR: 58.37 mL/min — ABNORMAL LOW (ref >60.00)
Glucose, Bld: 104 mg/dL — ABNORMAL HIGH (ref 70–99)
Potassium: 4.3 meq/L (ref 3.5–5.1)
Sodium: 140 meq/L (ref 135–145)

## 2023-09-04 LAB — PSA: PSA: 3.22 ng/mL (ref 0.10–4.00)

## 2023-09-04 LAB — HEMOGLOBIN A1C: Hgb A1c MFr Bld: 6.6 % — ABNORMAL HIGH (ref 4.6–6.5)

## 2023-09-04 LAB — TSH: TSH: 2.84 u[IU]/mL (ref 0.35–5.50)

## 2023-09-04 MED ORDER — ESOMEPRAZOLE MAGNESIUM 40 MG PO CPDR: 40.0000 mg | DELAYED_RELEASE_CAPSULE | Freq: Every day | ORAL | 3 refills | Status: AC

## 2023-09-04 NOTE — Progress Notes (Signed)
 Subjective:    Patient ID: Bryan Smith, male    DOB: December 05, 1955, 68 y.o.   MRN: [id number]  HPI Here to follow up on issues. He has no complaints today. His BP has been stable. He is urinating freely and he gets up only once at night to urinate. He last saw Dr. Devere for a urological exam on 07-21-22. His PSA that day was stable at 2.08, and he turned Ben back over to us . His GERD is stable, and he uses Nexium  only as needed.    Review of Systems  Constitutional: Negative.   HENT: Negative.    Eyes: Negative.   Respiratory: Negative.    Cardiovascular: Negative.   Gastrointestinal: Negative.   Genitourinary: Negative.   Musculoskeletal: Negative.   Skin: Negative.   Neurological: Negative.   Psychiatric/Behavioral: Negative.         Objective:   Physical Exam Constitutional:      General: He is not in acute distress.    Appearance: Normal appearance. He is well-developed. He is not diaphoretic.  HENT:     Head: Normocephalic and atraumatic.     Right Ear: External ear normal.     Left Ear: External ear normal.     Nose: Nose normal.     Mouth/Throat:     Pharynx: No oropharyngeal exudate.   Eyes:     General: No scleral icterus.       Right eye: No discharge.        Left eye: No discharge.     Conjunctiva/sclera: Conjunctivae normal.     Pupils: Pupils are equal, round, and reactive to light.   Neck:     Thyroid : No thyromegaly.     Vascular: No JVD.     Trachea: No tracheal deviation.   Cardiovascular:     Rate and Rhythm: Normal rate and regular rhythm.     Pulses: Normal pulses.     Heart sounds: Normal heart sounds. No murmur heard.    No friction rub. No gallop.  Pulmonary:     Effort: Pulmonary effort is normal. No respiratory distress.     Breath sounds: Normal breath sounds. No wheezing or rales.  Chest:     Chest wall: No tenderness.  Abdominal:     General: Bowel sounds are normal. There is no distension.     Palpations: Abdomen is soft.  There is no mass.     Tenderness: There is no abdominal tenderness. There is no guarding or rebound.  Genitourinary:    Penis: Normal. No tenderness.      Testes: Normal.     Rectum: Normal. Guaiac result negative.     Comments: Prostate mildly enlarged but smooth   Musculoskeletal:        General: No tenderness. Normal range of motion.     Cervical back: Neck supple.  Lymphadenopathy:     Cervical: No cervical adenopathy.   Skin:    General: Skin is warm and dry.     Coloration: Skin is not pale.     Findings: No erythema or rash.   Neurological:     General: No focal deficit present.     Mental Status: He is alert and oriented to person, place, and time.     Cranial Nerves: No cranial nerve deficit.     Motor: No abnormal muscle tone.     Coordination: Coordination normal.     Deep Tendon Reflexes: Reflexes are normal and symmetric. Reflexes normal.  Psychiatric:        Mood and Affect: Mood normal.        Behavior: Behavior normal.        Thought Content: Thought content normal.        Judgment: Judgment normal.           Assessment & Plan:  His HTN and GERD are stable. The BPH is stable, but we will check another PSA today. Get labs to check WBC, iron, etc. We spent a total of ( 33  ) minutes reviewing records and discussing these issues.  Garnette Olmsted, MD

## 2023-09-07 DIAGNOSIS — M542 Cervicalgia: Secondary | ICD-10-CM | POA: Diagnosis not present

## 2023-09-07 DIAGNOSIS — M9902 Segmental and somatic dysfunction of thoracic region: Secondary | ICD-10-CM | POA: Diagnosis not present

## 2023-09-07 DIAGNOSIS — M5412 Radiculopathy, cervical region: Secondary | ICD-10-CM | POA: Diagnosis not present

## 2023-09-07 DIAGNOSIS — M9901 Segmental and somatic dysfunction of cervical region: Secondary | ICD-10-CM | POA: Diagnosis not present

## 2023-09-21 DIAGNOSIS — M9902 Segmental and somatic dysfunction of thoracic region: Secondary | ICD-10-CM | POA: Diagnosis not present

## 2023-09-21 DIAGNOSIS — M5412 Radiculopathy, cervical region: Secondary | ICD-10-CM | POA: Diagnosis not present

## 2023-09-21 DIAGNOSIS — M9901 Segmental and somatic dysfunction of cervical region: Secondary | ICD-10-CM | POA: Diagnosis not present

## 2023-09-21 DIAGNOSIS — M542 Cervicalgia: Secondary | ICD-10-CM | POA: Diagnosis not present

## 2023-10-05 DIAGNOSIS — M9902 Segmental and somatic dysfunction of thoracic region: Secondary | ICD-10-CM | POA: Diagnosis not present

## 2023-10-05 DIAGNOSIS — M542 Cervicalgia: Secondary | ICD-10-CM | POA: Diagnosis not present

## 2023-10-05 DIAGNOSIS — M9901 Segmental and somatic dysfunction of cervical region: Secondary | ICD-10-CM | POA: Diagnosis not present

## 2023-10-05 DIAGNOSIS — M5412 Radiculopathy, cervical region: Secondary | ICD-10-CM | POA: Diagnosis not present

## 2023-10-19 DIAGNOSIS — M9902 Segmental and somatic dysfunction of thoracic region: Secondary | ICD-10-CM | POA: Diagnosis not present

## 2023-10-19 DIAGNOSIS — M5412 Radiculopathy, cervical region: Secondary | ICD-10-CM | POA: Diagnosis not present

## 2023-10-19 DIAGNOSIS — M9901 Segmental and somatic dysfunction of cervical region: Secondary | ICD-10-CM | POA: Diagnosis not present

## 2023-10-19 DIAGNOSIS — M542 Cervicalgia: Secondary | ICD-10-CM | POA: Diagnosis not present

## 2023-11-02 DIAGNOSIS — M9901 Segmental and somatic dysfunction of cervical region: Secondary | ICD-10-CM | POA: Diagnosis not present

## 2023-11-02 DIAGNOSIS — M5412 Radiculopathy, cervical region: Secondary | ICD-10-CM | POA: Diagnosis not present

## 2023-11-02 DIAGNOSIS — M9902 Segmental and somatic dysfunction of thoracic region: Secondary | ICD-10-CM | POA: Diagnosis not present

## 2023-11-02 DIAGNOSIS — M542 Cervicalgia: Secondary | ICD-10-CM | POA: Diagnosis not present

## 2023-11-11 ENCOUNTER — Other Ambulatory Visit: Payer: Self-pay | Admitting: Family Medicine

## 2023-11-11 DIAGNOSIS — I10 Essential (primary) hypertension: Secondary | ICD-10-CM

## 2023-11-16 DIAGNOSIS — M9902 Segmental and somatic dysfunction of thoracic region: Secondary | ICD-10-CM | POA: Diagnosis not present

## 2023-11-16 DIAGNOSIS — M542 Cervicalgia: Secondary | ICD-10-CM | POA: Diagnosis not present

## 2023-11-16 DIAGNOSIS — M9901 Segmental and somatic dysfunction of cervical region: Secondary | ICD-10-CM | POA: Diagnosis not present

## 2023-11-16 DIAGNOSIS — M5412 Radiculopathy, cervical region: Secondary | ICD-10-CM | POA: Diagnosis not present

## 2023-11-30 DIAGNOSIS — M9902 Segmental and somatic dysfunction of thoracic region: Secondary | ICD-10-CM | POA: Diagnosis not present

## 2023-11-30 DIAGNOSIS — M9901 Segmental and somatic dysfunction of cervical region: Secondary | ICD-10-CM | POA: Diagnosis not present

## 2023-11-30 DIAGNOSIS — M542 Cervicalgia: Secondary | ICD-10-CM | POA: Diagnosis not present

## 2023-11-30 DIAGNOSIS — M5412 Radiculopathy, cervical region: Secondary | ICD-10-CM | POA: Diagnosis not present

## 2023-12-14 DIAGNOSIS — B8801 Infestation by demodex mites: Secondary | ICD-10-CM | POA: Diagnosis not present

## 2023-12-14 DIAGNOSIS — H0184 Other specified inflammation of left upper eyelid: Secondary | ICD-10-CM | POA: Diagnosis not present

## 2023-12-14 DIAGNOSIS — H0181 Other specified inflammation of right upper eyelid: Secondary | ICD-10-CM | POA: Diagnosis not present

## 2023-12-14 DIAGNOSIS — M9901 Segmental and somatic dysfunction of cervical region: Secondary | ICD-10-CM | POA: Diagnosis not present

## 2023-12-14 DIAGNOSIS — M5412 Radiculopathy, cervical region: Secondary | ICD-10-CM | POA: Diagnosis not present

## 2023-12-14 DIAGNOSIS — M9902 Segmental and somatic dysfunction of thoracic region: Secondary | ICD-10-CM | POA: Diagnosis not present

## 2023-12-14 DIAGNOSIS — M542 Cervicalgia: Secondary | ICD-10-CM | POA: Diagnosis not present

## 2023-12-28 DIAGNOSIS — M9902 Segmental and somatic dysfunction of thoracic region: Secondary | ICD-10-CM | POA: Diagnosis not present

## 2023-12-28 DIAGNOSIS — M9901 Segmental and somatic dysfunction of cervical region: Secondary | ICD-10-CM | POA: Diagnosis not present

## 2023-12-28 DIAGNOSIS — M9903 Segmental and somatic dysfunction of lumbar region: Secondary | ICD-10-CM | POA: Diagnosis not present

## 2023-12-28 DIAGNOSIS — M5412 Radiculopathy, cervical region: Secondary | ICD-10-CM | POA: Diagnosis not present

## 2023-12-28 DIAGNOSIS — M542 Cervicalgia: Secondary | ICD-10-CM | POA: Diagnosis not present

## 2024-01-11 DIAGNOSIS — M542 Cervicalgia: Secondary | ICD-10-CM | POA: Diagnosis not present

## 2024-01-11 DIAGNOSIS — M9903 Segmental and somatic dysfunction of lumbar region: Secondary | ICD-10-CM | POA: Diagnosis not present

## 2024-01-11 DIAGNOSIS — M5412 Radiculopathy, cervical region: Secondary | ICD-10-CM | POA: Diagnosis not present

## 2024-01-11 DIAGNOSIS — M9901 Segmental and somatic dysfunction of cervical region: Secondary | ICD-10-CM | POA: Diagnosis not present

## 2024-01-11 DIAGNOSIS — M9902 Segmental and somatic dysfunction of thoracic region: Secondary | ICD-10-CM | POA: Diagnosis not present

## 2024-01-25 DIAGNOSIS — M9902 Segmental and somatic dysfunction of thoracic region: Secondary | ICD-10-CM | POA: Diagnosis not present

## 2024-01-25 DIAGNOSIS — M542 Cervicalgia: Secondary | ICD-10-CM | POA: Diagnosis not present

## 2024-01-25 DIAGNOSIS — M9901 Segmental and somatic dysfunction of cervical region: Secondary | ICD-10-CM | POA: Diagnosis not present

## 2024-01-25 DIAGNOSIS — M9903 Segmental and somatic dysfunction of lumbar region: Secondary | ICD-10-CM | POA: Diagnosis not present

## 2024-01-25 DIAGNOSIS — M5412 Radiculopathy, cervical region: Secondary | ICD-10-CM | POA: Diagnosis not present

## 2024-01-31 ENCOUNTER — Other Ambulatory Visit: Payer: Self-pay | Admitting: Family Medicine

## 2024-01-31 DIAGNOSIS — I10 Essential (primary) hypertension: Secondary | ICD-10-CM

## 2024-02-08 DIAGNOSIS — M9901 Segmental and somatic dysfunction of cervical region: Secondary | ICD-10-CM | POA: Diagnosis not present

## 2024-02-08 DIAGNOSIS — M542 Cervicalgia: Secondary | ICD-10-CM | POA: Diagnosis not present

## 2024-02-08 DIAGNOSIS — M9903 Segmental and somatic dysfunction of lumbar region: Secondary | ICD-10-CM | POA: Diagnosis not present

## 2024-02-08 DIAGNOSIS — M9902 Segmental and somatic dysfunction of thoracic region: Secondary | ICD-10-CM | POA: Diagnosis not present

## 2024-02-08 DIAGNOSIS — M5412 Radiculopathy, cervical region: Secondary | ICD-10-CM | POA: Diagnosis not present

## 2024-02-09 DIAGNOSIS — H0015 Chalazion left lower eyelid: Secondary | ICD-10-CM | POA: Diagnosis not present

## 2024-03-24 ENCOUNTER — Encounter: Payer: Self-pay | Admitting: Family Medicine

## 2024-03-24 NOTE — Telephone Encounter (Signed)
 Please advise

## 2024-03-24 NOTE — Telephone Encounter (Signed)
 He can take Mucinex DM twice daily

## 2024-04-11 ENCOUNTER — Encounter: Payer: Self-pay | Admitting: Family Medicine

## 2024-04-13 NOTE — Telephone Encounter (Signed)
 He can use Delsym for the cough. Yes he can eat an occasional grapefruit

## 2024-05-05 IMAGING — MR MR HIP*L* W/O CM
4 of 5 series · 32 of 40 positions shown · non-contrast
Comparison: Left hip x-ray 07/08/2021

CLINICAL DATA: Left hip pain

EXAM:
MR OF THE LEFT HIP WITHOUT CONTRAST
TECHNIQUE: Multiplanar, multisequence MR imaging was performed. No intravenous
contrast was administered.

[Series 3: T1 · coronal · 4.0mm · 1.19mm/px · 9 of 24 slices shown]
[im 1/24]
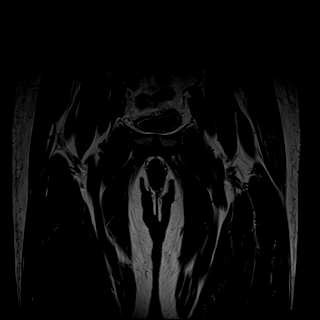
[im 3/24]
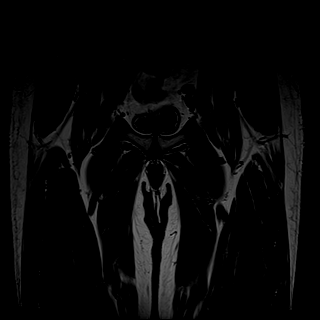
[im 6/24]
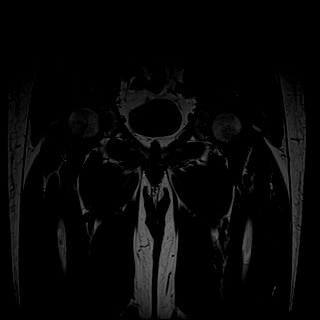
[im 9/24]
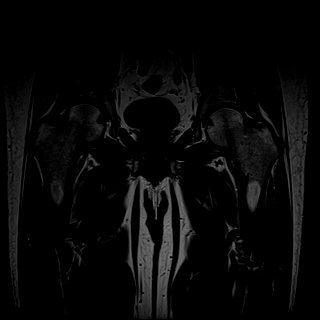
[im 12/24]
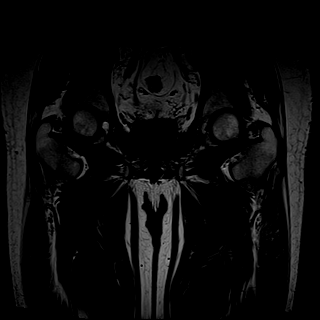
[im 15/24]
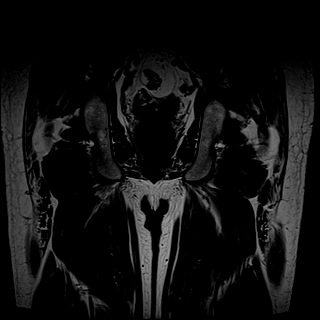
[im 18/24]
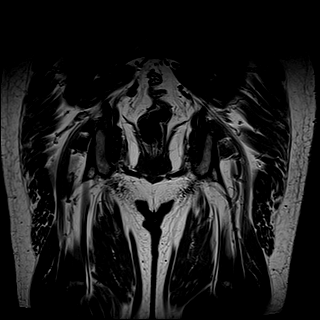
[im 21/24]
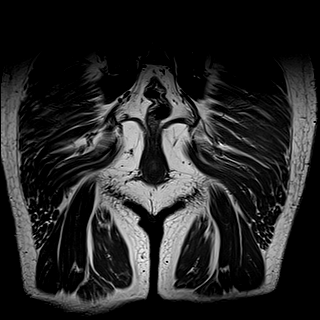
[im 24/24]
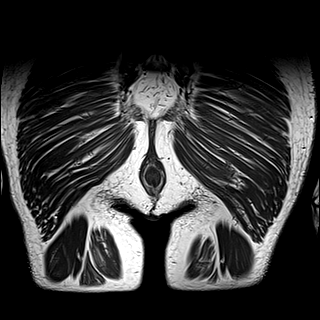

[Series 4: T2 fat-sat · coronal · 4.0mm · 1.19mm/px · 8 of 24 slices shown (1 of 2)]
[im 1/24]
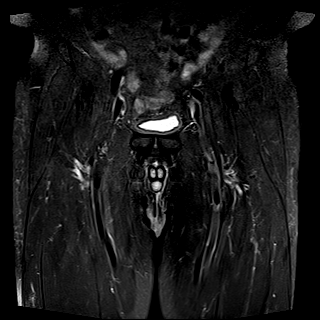
[im 4/24]
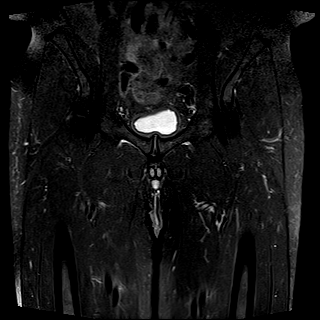
[im 7/24]
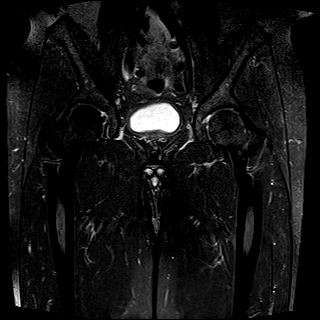
[im 10/24]
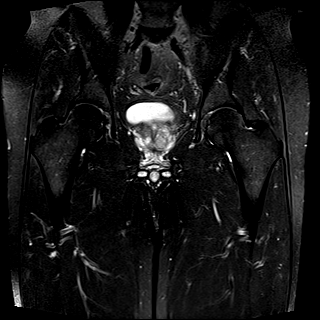
[im 14/24]
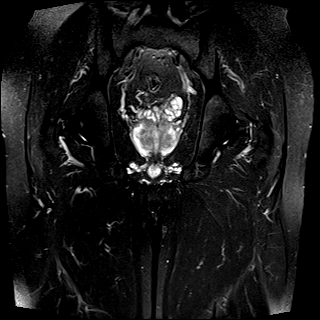
[im 17/24]
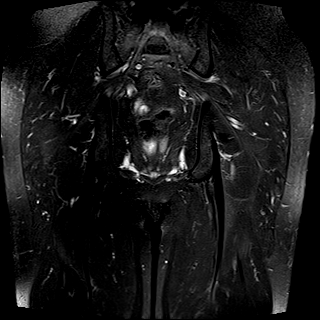
[im 20/24]
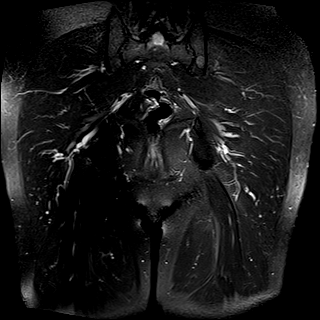
[im 24/24]
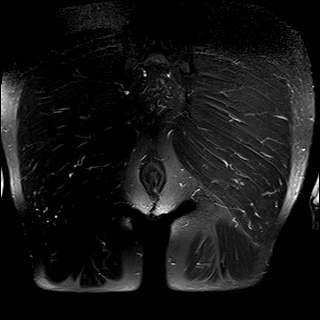

[Series 5: T2 fat-sat · axial · 4.0mm · 0.62mm/px · z∈[+13,+133]mm · 9 of 26 slices shown (2 of 2)]
[im 1/26]
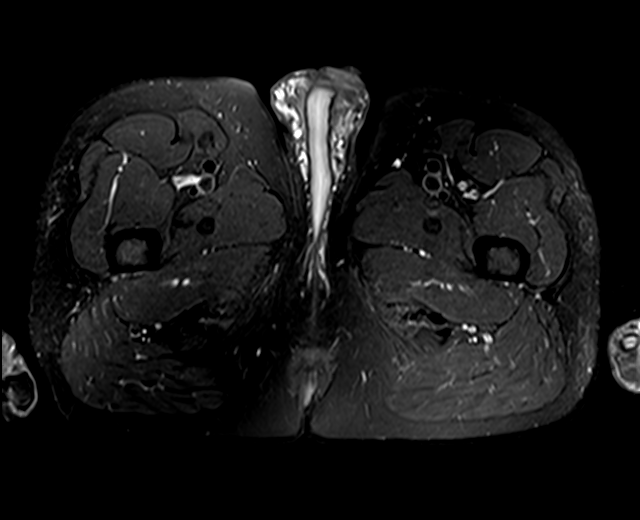
[im 4/26]
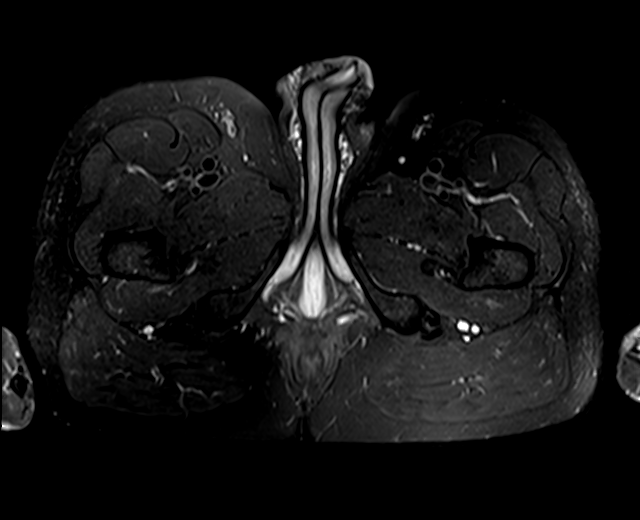
[im 7/26]
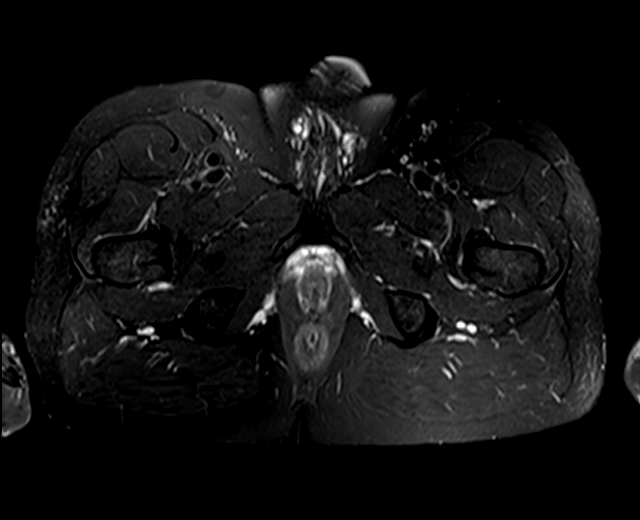
[im 10/26]
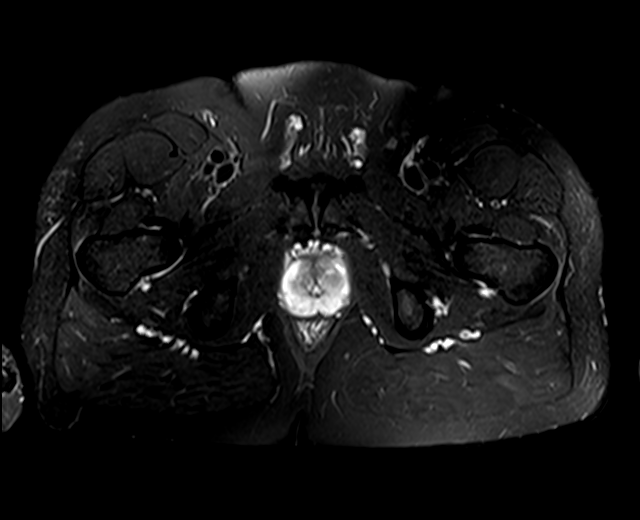
[im 13/26]
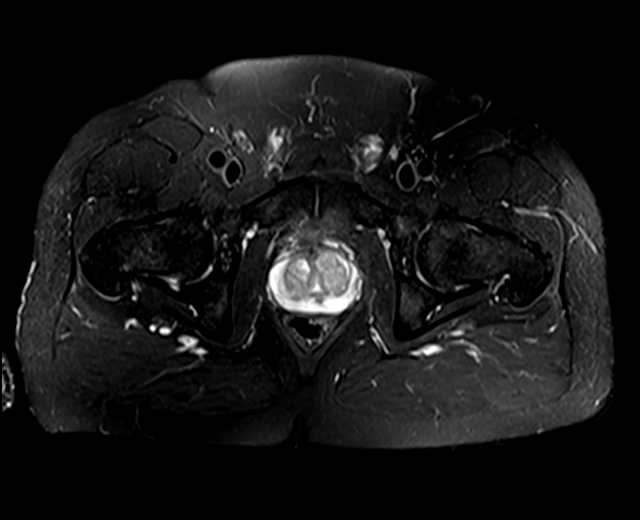
[im 16/26]
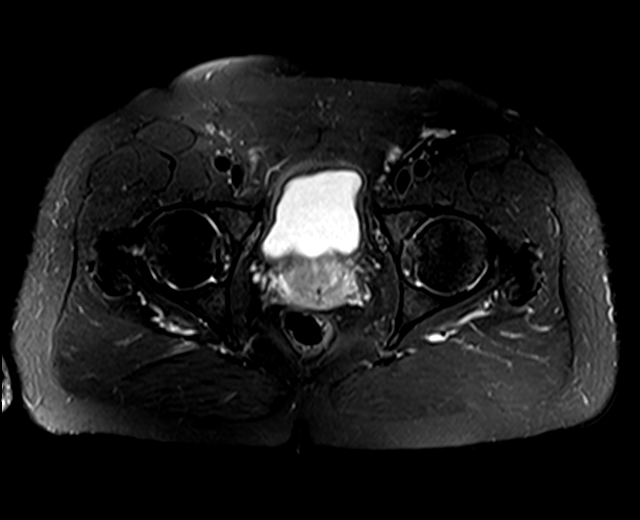
[im 19/26]
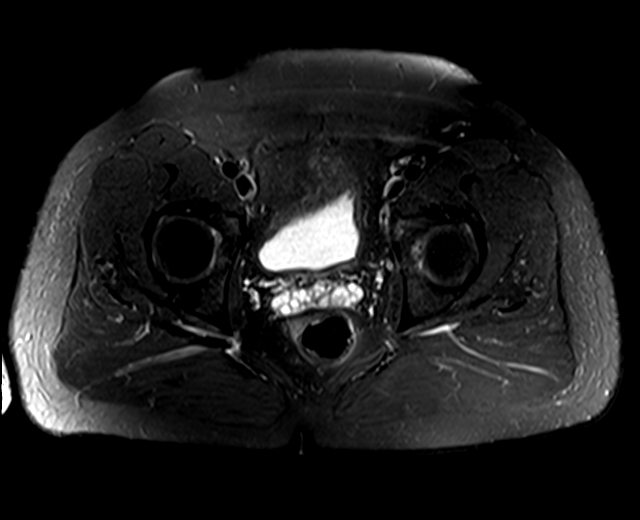
[im 22/26]
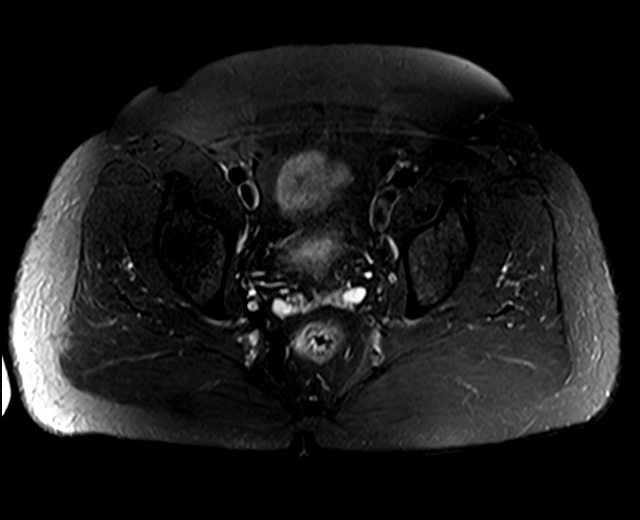
[im 26/26]
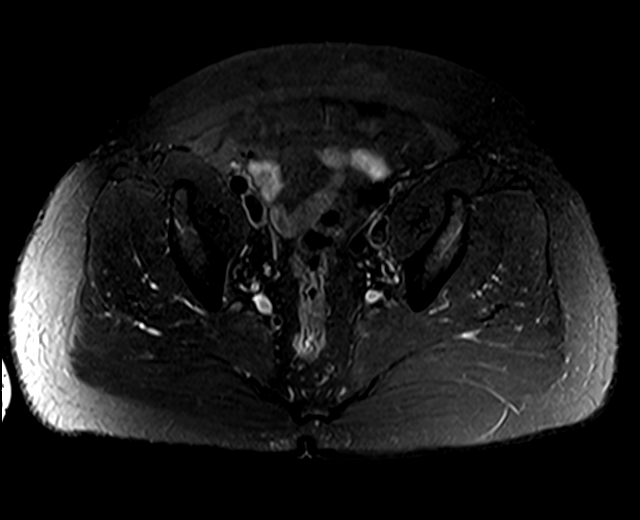

[Series 6: PD fat-sat · sagittal · 4.0mm · 0.70mm/px · 6 of 24 slices shown]
[im 1/24]
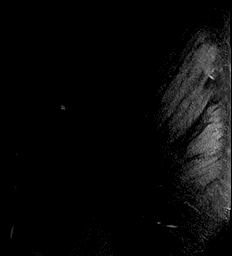
[im 4/24]
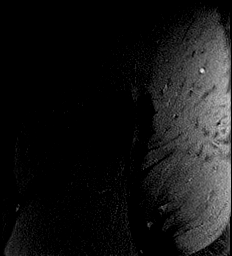
[im 7/24]
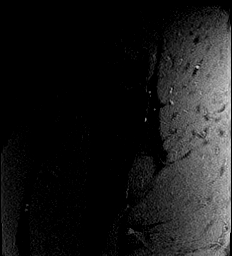
[im 10/24]
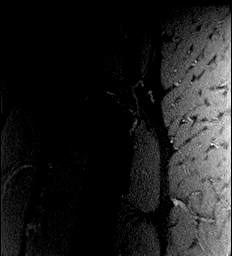
[im 14/24]
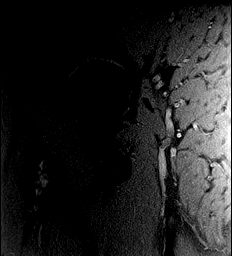
[im 20/24]
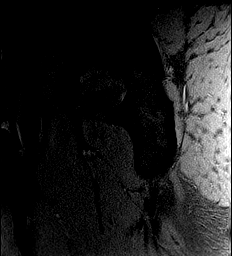

[32 of 40 positions shown; findings below may reference images not displayed]

FINDINGS: Bones: No acute fracture or dislocation identified. No suspicious
bone marrow signal abnormalities.

Articular cartilage and labrum

Articular cartilage:  Appears preserved.

Labrum:  Not well evaluated without contrast.

Joint or bursal effusion

Joint effusion:  None.

Bursae: Unremarkable.

Muscles and tendons

Muscles and tendons:  Within normal limits.

Other findings

Miscellaneous:   None.
IMPRESSION: No significant abnormality identified in the left hip.

## 2024-06-15 ENCOUNTER — Ambulatory Visit
